# Patient Record
Sex: Female | Born: 1975 | Race: White | Hispanic: No | Marital: Married | Smoking: Former smoker
Health system: Southern US, Community
[De-identification: ages and names within clinical notes are randomized; demographics above are authoritative.]

## PROBLEM LIST (undated history)

## (undated) DIAGNOSIS — K219 Gastro-esophageal reflux disease without esophagitis: Secondary | ICD-10-CM

## (undated) DIAGNOSIS — R7303 Prediabetes: Secondary | ICD-10-CM

## (undated) DIAGNOSIS — T7840XA Allergy, unspecified, initial encounter: Secondary | ICD-10-CM

## (undated) DIAGNOSIS — L409 Psoriasis, unspecified: Secondary | ICD-10-CM

## (undated) DIAGNOSIS — M1990 Unspecified osteoarthritis, unspecified site: Secondary | ICD-10-CM

## (undated) DIAGNOSIS — Z87891 Personal history of nicotine dependence: Secondary | ICD-10-CM

## (undated) DIAGNOSIS — R112 Nausea with vomiting, unspecified: Secondary | ICD-10-CM

## (undated) DIAGNOSIS — F32A Depression, unspecified: Secondary | ICD-10-CM

## (undated) DIAGNOSIS — Z9889 Other specified postprocedural states: Secondary | ICD-10-CM

## (undated) DIAGNOSIS — F419 Anxiety disorder, unspecified: Secondary | ICD-10-CM

## (undated) DIAGNOSIS — Z87442 Personal history of urinary calculi: Secondary | ICD-10-CM

## (undated) DIAGNOSIS — E039 Hypothyroidism, unspecified: Secondary | ICD-10-CM

## (undated) DIAGNOSIS — R000 Tachycardia, unspecified: Secondary | ICD-10-CM

## (undated) DIAGNOSIS — F329 Major depressive disorder, single episode, unspecified: Secondary | ICD-10-CM

## (undated) DIAGNOSIS — T8859XA Other complications of anesthesia, initial encounter: Secondary | ICD-10-CM

## (undated) HISTORY — DX: Anxiety disorder, unspecified: F41.9

## (undated) HISTORY — DX: Allergy, unspecified, initial encounter: T78.40XA

## (undated) HISTORY — DX: Gastro-esophageal reflux disease without esophagitis: K21.9

## (undated) HISTORY — DX: Psoriasis, unspecified: L40.9

## (undated) HISTORY — PX: FOOT SURGERY: SHX648

## (undated) HISTORY — DX: Major depressive disorder, single episode, unspecified: F32.9

## (undated) HISTORY — DX: Depression, unspecified: F32.A

---
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
## 2000-12-27 ENCOUNTER — Other Ambulatory Visit: Admission: RE | Admit: 2000-12-27 | Discharge: 2000-12-27 | Payer: Self-pay | Admitting: Family Medicine

## 2002-08-10 DIAGNOSIS — Z87442 Personal history of urinary calculi: Secondary | ICD-10-CM

## 2002-08-10 HISTORY — PX: LITHOTRIPSY: SUR834

## 2002-08-10 HISTORY — DX: Personal history of urinary calculi: Z87.442

## 2002-08-10 HISTORY — PX: OTHER SURGICAL HISTORY: SHX169

## 2005-05-23 ENCOUNTER — Emergency Department: Payer: Self-pay | Admitting: General Practice

## 2007-10-19 ENCOUNTER — Emergency Department: Payer: Self-pay | Admitting: Emergency Medicine

## 2007-10-24 ENCOUNTER — Ambulatory Visit: Payer: Self-pay | Admitting: Family Medicine

## 2008-03-23 DIAGNOSIS — R002 Palpitations: Secondary | ICD-10-CM | POA: Insufficient documentation

## 2009-11-18 ENCOUNTER — Emergency Department: Payer: Self-pay | Admitting: Emergency Medicine

## 2009-11-21 ENCOUNTER — Ambulatory Visit: Payer: Self-pay

## 2010-08-10 HISTORY — PX: FOOT FRACTURE SURGERY: SHX645

## 2011-07-23 ENCOUNTER — Ambulatory Visit: Payer: Self-pay | Admitting: Rheumatology

## 2011-08-21 ENCOUNTER — Ambulatory Visit: Payer: Self-pay | Admitting: Podiatry

## 2011-09-24 ENCOUNTER — Encounter: Payer: Self-pay | Admitting: Podiatry

## 2011-10-09 ENCOUNTER — Encounter: Payer: Self-pay | Admitting: Podiatry

## 2011-11-09 ENCOUNTER — Encounter: Payer: Self-pay | Admitting: Podiatry

## 2012-05-17 LAB — HM PAP SMEAR: HM Pap smear: NEGATIVE

## 2013-06-29 ENCOUNTER — Ambulatory Visit: Payer: Self-pay | Admitting: Family Medicine

## 2013-08-27 ENCOUNTER — Ambulatory Visit: Payer: Self-pay | Admitting: Physician Assistant

## 2013-09-07 ENCOUNTER — Ambulatory Visit: Payer: Self-pay | Admitting: Family Medicine

## 2014-04-17 ENCOUNTER — Encounter: Payer: Self-pay | Admitting: Podiatry

## 2014-05-10 ENCOUNTER — Encounter: Payer: Self-pay | Admitting: Podiatry

## 2014-06-10 ENCOUNTER — Encounter: Payer: Self-pay | Admitting: Podiatry

## 2014-12-02 NOTE — Op Note (Signed)
PATIENT NAME:  Kelsey Randolph, Kelsey Randolph MR#:  [id number] DATE OF BIRTH:  October 11, 1975  DATE OF PROCEDURE:  08/21/2011  PREOPERATIVE DIAGNOSIS: Left ankle osteochondral defect.   POSTOPERATIVE DIAGNOSIS: Left ankle osteochondral defect.    PROCEDURE: Ankle arthroscopy with arthroscopic repair and debridement of osteochondral defect with debridement of anterior ankle synovitis.   SURGEON: Tiawanna Luchsinger A. Vickki Randolph, DPM.   ANESTHESIA: General.   HEMOSTASIS: Thigh tourniquet inflated to 325 mmHg for exactly 66 minutes.   COMPLICATIONS: None.   SPECIMEN: None.   OPERATIVE INDICATIONS: This is a 39 year old female with complaint of chronic left ankle pain. She has undergone a fair amount of conservative treatment and presents today for surgical intervention. All risks, benefits, alternatives, and complications associated with surgery were discussed with the patient. Full consent has been given.   OPERATIVE PROCEDURE: The patient was brought into the Operating Room and placed on the operating table in the supine position. General intubation was administered by the anesthesia team. The left lower extremity was then prepped and draped in the usual sterile fashion. Attention was directed to the left ankle where the anteromedial and anterolateral portals were made. Blunt dissection was taken down into the ankle joint. The anteromedial portal was entered initially. The arthroscope was then entered and there was noted to be a fair amount of synovitis on the anterior aspect of the ankle joint. At this time, through the anterolateral portal the debrider was brought in and the anterior aspect of the ankle joint synovitis was debrided away to good healthy tissue. At this time the ankle distractor was then placed and distraction was placed to allow visualization of basically the entire ankle joint. The lateral and central portion of the talus did not show any weakness of the cartilage on the medial shoulder. In the central to  posterior central aspect there was noted to be fraying of cartilage with an obvious osteochondral defect. At this time with a combination of curettage and shaving, I was able to remove the loose articular cartilage. Multiple microfractures were then made with the small joint microfracture set. It was also debrided with an arthroscopic debriding wand for cleaning of the edges to allow a nice smooth edge along the cartilage defect that had been debrided away. There was noted to be a little bit of posterior inflammatory tissue and this was also debrided with the Arthro wand as well as a shaver. This took a fair amount of posterior impingement out of the posterior lateral aspect of the ankle joint. At this time, no further defects were noted and layered closure was performed with a 4-0 Vicryl for the subcutaneous tissue and a 4-0 nylon for the skin. A 0.5% Marcaine block with epinephrine was placed into the ankle joint portals. She was then placed in a well compressive sterile dressing into an equalizer walker boot. She is instructed for nonweightbearing to her left ankle. She was given a prescription for Percocet for pain and Phenergan for nausea and vomiting. I will see her in the outpatient clinic next week for further evaluation.   ____________________________ Kelsey Randolph, DPM jaf:ap D: 08/21/2011 11:58:46 ET T: 08/21/2011 15:22:35 ET JOB#: [id number]  cc: Larkin Ina A. Vickki Randolph, DPM, <Dictator> Kelsey Randolph DPM ELECTRONICALLY SIGNED 09/10/2011 15:14

## 2015-01-28 ENCOUNTER — Encounter: Payer: Self-pay | Admitting: Family Medicine

## 2015-01-28 DIAGNOSIS — J309 Allergic rhinitis, unspecified: Secondary | ICD-10-CM | POA: Insufficient documentation

## 2015-01-28 DIAGNOSIS — K219 Gastro-esophageal reflux disease without esophagitis: Secondary | ICD-10-CM | POA: Insufficient documentation

## 2015-01-28 DIAGNOSIS — L409 Psoriasis, unspecified: Secondary | ICD-10-CM | POA: Insufficient documentation

## 2015-01-28 DIAGNOSIS — M722 Plantar fascial fibromatosis: Secondary | ICD-10-CM | POA: Insufficient documentation

## 2015-01-28 DIAGNOSIS — R7303 Prediabetes: Secondary | ICD-10-CM | POA: Insufficient documentation

## 2015-01-28 DIAGNOSIS — M545 Low back pain, unspecified: Secondary | ICD-10-CM | POA: Insufficient documentation

## 2015-01-28 DIAGNOSIS — R079 Chest pain, unspecified: Secondary | ICD-10-CM | POA: Insufficient documentation

## 2015-01-28 DIAGNOSIS — Z3009 Encounter for other general counseling and advice on contraception: Secondary | ICD-10-CM | POA: Insufficient documentation

## 2015-01-28 DIAGNOSIS — F419 Anxiety disorder, unspecified: Secondary | ICD-10-CM | POA: Insufficient documentation

## 2015-01-28 DIAGNOSIS — R799 Abnormal finding of blood chemistry, unspecified: Secondary | ICD-10-CM | POA: Insufficient documentation

## 2015-01-28 DIAGNOSIS — E559 Vitamin D deficiency, unspecified: Secondary | ICD-10-CM | POA: Insufficient documentation

## 2015-03-13 ENCOUNTER — Encounter: Payer: Self-pay | Admitting: Family Medicine

## 2015-05-14 ENCOUNTER — Encounter: Payer: Self-pay | Admitting: Family Medicine

## 2015-08-13 ENCOUNTER — Encounter: Payer: Self-pay | Admitting: Family Medicine

## 2015-09-12 DIAGNOSIS — M659 Synovitis and tenosynovitis, unspecified: Secondary | ICD-10-CM | POA: Diagnosis not present

## 2015-09-12 DIAGNOSIS — M2011 Hallux valgus (acquired), right foot: Secondary | ICD-10-CM | POA: Diagnosis not present

## 2015-09-30 DIAGNOSIS — M9903 Segmental and somatic dysfunction of lumbar region: Secondary | ICD-10-CM | POA: Diagnosis not present

## 2015-09-30 DIAGNOSIS — M5441 Lumbago with sciatica, right side: Secondary | ICD-10-CM | POA: Diagnosis not present

## 2015-09-30 DIAGNOSIS — M7741 Metatarsalgia, right foot: Secondary | ICD-10-CM | POA: Diagnosis not present

## 2015-09-30 DIAGNOSIS — M62830 Muscle spasm of back: Secondary | ICD-10-CM | POA: Diagnosis not present

## 2015-10-01 DIAGNOSIS — M9903 Segmental and somatic dysfunction of lumbar region: Secondary | ICD-10-CM | POA: Diagnosis not present

## 2015-10-01 DIAGNOSIS — M9906 Segmental and somatic dysfunction of lower extremity: Secondary | ICD-10-CM | POA: Diagnosis not present

## 2015-10-01 DIAGNOSIS — M7741 Metatarsalgia, right foot: Secondary | ICD-10-CM | POA: Diagnosis not present

## 2015-10-01 DIAGNOSIS — M62830 Muscle spasm of back: Secondary | ICD-10-CM | POA: Diagnosis not present

## 2015-10-01 DIAGNOSIS — M5441 Lumbago with sciatica, right side: Secondary | ICD-10-CM | POA: Diagnosis not present

## 2015-10-03 DIAGNOSIS — M62830 Muscle spasm of back: Secondary | ICD-10-CM | POA: Diagnosis not present

## 2015-10-03 DIAGNOSIS — M7741 Metatarsalgia, right foot: Secondary | ICD-10-CM | POA: Diagnosis not present

## 2015-10-03 DIAGNOSIS — M9903 Segmental and somatic dysfunction of lumbar region: Secondary | ICD-10-CM | POA: Diagnosis not present

## 2015-10-03 DIAGNOSIS — M5441 Lumbago with sciatica, right side: Secondary | ICD-10-CM | POA: Diagnosis not present

## 2015-10-03 DIAGNOSIS — M9906 Segmental and somatic dysfunction of lower extremity: Secondary | ICD-10-CM | POA: Diagnosis not present

## 2015-10-07 DIAGNOSIS — M62830 Muscle spasm of back: Secondary | ICD-10-CM | POA: Diagnosis not present

## 2015-10-07 DIAGNOSIS — M9903 Segmental and somatic dysfunction of lumbar region: Secondary | ICD-10-CM | POA: Diagnosis not present

## 2015-10-07 DIAGNOSIS — M5441 Lumbago with sciatica, right side: Secondary | ICD-10-CM | POA: Diagnosis not present

## 2015-10-07 DIAGNOSIS — M7741 Metatarsalgia, right foot: Secondary | ICD-10-CM | POA: Diagnosis not present

## 2015-10-07 DIAGNOSIS — M9906 Segmental and somatic dysfunction of lower extremity: Secondary | ICD-10-CM | POA: Diagnosis not present

## 2015-10-10 DIAGNOSIS — M5441 Lumbago with sciatica, right side: Secondary | ICD-10-CM | POA: Diagnosis not present

## 2015-10-10 DIAGNOSIS — M7741 Metatarsalgia, right foot: Secondary | ICD-10-CM | POA: Diagnosis not present

## 2015-10-10 DIAGNOSIS — M9903 Segmental and somatic dysfunction of lumbar region: Secondary | ICD-10-CM | POA: Diagnosis not present

## 2015-10-10 DIAGNOSIS — M62830 Muscle spasm of back: Secondary | ICD-10-CM | POA: Diagnosis not present

## 2015-10-14 ENCOUNTER — Encounter: Payer: Self-pay | Admitting: Family Medicine

## 2015-10-16 DIAGNOSIS — M62830 Muscle spasm of back: Secondary | ICD-10-CM | POA: Diagnosis not present

## 2015-10-16 DIAGNOSIS — M9903 Segmental and somatic dysfunction of lumbar region: Secondary | ICD-10-CM | POA: Diagnosis not present

## 2015-10-16 DIAGNOSIS — M5441 Lumbago with sciatica, right side: Secondary | ICD-10-CM | POA: Diagnosis not present

## 2015-10-16 DIAGNOSIS — M7741 Metatarsalgia, right foot: Secondary | ICD-10-CM | POA: Diagnosis not present

## 2015-11-14 DIAGNOSIS — M25561 Pain in right knee: Secondary | ICD-10-CM | POA: Diagnosis not present

## 2015-11-14 DIAGNOSIS — L4050 Arthropathic psoriasis, unspecified: Secondary | ICD-10-CM | POA: Diagnosis not present

## 2015-11-28 ENCOUNTER — Encounter: Payer: Self-pay | Admitting: Family Medicine

## 2015-12-05 DIAGNOSIS — H5203 Hypermetropia, bilateral: Secondary | ICD-10-CM | POA: Diagnosis not present

## 2016-01-07 ENCOUNTER — Other Ambulatory Visit: Payer: Self-pay | Admitting: Family Medicine

## 2016-01-07 DIAGNOSIS — E039 Hypothyroidism, unspecified: Secondary | ICD-10-CM

## 2016-01-22 ENCOUNTER — Ambulatory Visit (INDEPENDENT_AMBULATORY_CARE_PROVIDER_SITE_OTHER): Payer: 59 | Admitting: Family Medicine

## 2016-01-22 ENCOUNTER — Encounter: Payer: Self-pay | Admitting: Family Medicine

## 2016-01-22 VITALS — BP 110/80 | HR 96 | Temp 98.7°F | Resp 16 | Ht 63.0 in | Wt 322.0 lb

## 2016-01-22 DIAGNOSIS — F32A Depression, unspecified: Secondary | ICD-10-CM

## 2016-01-22 DIAGNOSIS — Z0000 Encounter for general adult medical examination without abnormal findings: Secondary | ICD-10-CM | POA: Diagnosis not present

## 2016-01-22 DIAGNOSIS — R7303 Prediabetes: Secondary | ICD-10-CM

## 2016-01-22 DIAGNOSIS — Z124 Encounter for screening for malignant neoplasm of cervix: Secondary | ICD-10-CM

## 2016-01-22 DIAGNOSIS — Z87891 Personal history of nicotine dependence: Secondary | ICD-10-CM | POA: Diagnosis not present

## 2016-01-22 DIAGNOSIS — F419 Anxiety disorder, unspecified: Secondary | ICD-10-CM

## 2016-01-22 DIAGNOSIS — F329 Major depressive disorder, single episode, unspecified: Secondary | ICD-10-CM

## 2016-01-22 DIAGNOSIS — E039 Hypothyroidism, unspecified: Secondary | ICD-10-CM

## 2016-01-22 MED ORDER — ALPRAZOLAM 0.5 MG PO TABS: 0.5000 mg | ORAL_TABLET | Freq: Every evening | ORAL | Status: DC | PRN

## 2016-01-22 MED ORDER — LEVOTHYROXINE SODIUM 50 MCG PO TABS: 50.0000 ug | ORAL_TABLET | Freq: Every day | ORAL | Status: DC

## 2016-01-22 MED ORDER — ESCITALOPRAM OXALATE 20 MG PO TABS: 20.0000 mg | ORAL_TABLET | Freq: Every day | ORAL | Status: DC

## 2016-01-22 NOTE — Progress Notes (Signed)
Patient ID: Kelsey Randolph, female   DOB: 1976/04/28, 40 y.o.   MRN: [id number]       Patient: Kelsey Randolph, Female    DOB: Sep 09, 1975, 40 y.o.   MRN: [id number] Visit Date: 01/22/2016  Today's Provider: Margarita Rana, MD   Chief Complaint  Patient presents with  . Annual Exam   Subjective:    Annual physical exam Kelsey Randolph is a 40 y.o. female who presents today for health maintenance and complete physical. She feels well. She reports exercising 4 days a week. She reports she is sleeping well.  06/29/13 CPE 05/17/12 Pap-neg; HPV-neg  -----------------------------------------------------------------   Review of Systems  Constitutional: Negative.   HENT: Negative.   Eyes: Positive for discharge.  Respiratory: Negative.   Cardiovascular: Negative.   Gastrointestinal: Negative.   Endocrine: Negative.   Genitourinary: Negative.   Musculoskeletal: Positive for joint swelling and arthralgias.  Skin: Negative.   Allergic/Immunologic: Positive for environmental allergies.  Neurological: Negative.   Hematological: Bruises/bleeds easily.  Psychiatric/Behavioral: Negative.     Social History      She  reports that she quit smoking about 5 months ago. She has never used smokeless tobacco. She reports that she does not drink alcohol or use illicit drugs.       Social History   Social History  . Marital Status: Married    Spouse Name: N/A  . Number of Children: N/A  . Years of Education: N/A   Social History Main Topics  . Smoking status: Former Smoker    Quit date: 08/10/2015  . Smokeless tobacco: Never Used  . Alcohol Use: No  . Drug Use: No  . Sexual Activity: Not Asked   Other Topics Concern  . None   Social History Narrative    Past Medical History  Diagnosis Date  . Allergy   . Anxiety   . Depression   . GERD (gastroesophageal reflux disease)      Patient Active Problem List   Diagnosis Date Noted  . Abnormal blood chemistry 01/28/2015  .  Allergic rhinitis 01/28/2015  . Anxiety 01/28/2015  . Chest pain 01/28/2015  . Family planning 01/28/2015  . Acid reflux 01/28/2015  . LBP (low back pain) 01/28/2015  . Plantar fasciitis 01/28/2015  . Borderline diabetes 01/28/2015  . Psoriasis 01/28/2015  . Avitaminosis D 01/28/2015  . Disease of white blood cells 11/22/2009  . Awareness of heartbeats 03/23/2008  . Adult hypothyroidism 03/14/2008  . History of tobacco use 11/30/2007  . Calculus of kidney 11/23/2007  . 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 Clinical depression 03/04/2001  . Extreme obesity ([place]) 08/10/1998    Past Surgical History  Procedure Laterality Date  . Kidney stones removed  2004  . Foot surgery Left   . Tonsillectomy and adenoidectomy  1993    Family History        Family Status  Relation Status Death Age  . Mother Alive   . Brother Alive   . Paternal Grandfather Deceased 22  . Father Deceased 63    MI        Her family history includes Dementia in her maternal grandmother; Depression in her mother; Diabetes in her maternal grandmother; Healthy in her brother; Heart disease in her father and paternal grandfather; Hyperlipidemia in her mother; Hypertension in her mother.    No Known Allergies  Current Meds  Medication Sig  . ALPRAZolam (XANAX) 0.5 MG tablet Take by mouth.  . cetirizine (ZYRTEC ALLERGY) 10 MG  tablet Take by mouth.  . Cholecalciferol 1000 UNITS capsule Take by mouth.  . escitalopram (LEXAPRO) 20 MG tablet Take 20 mg by mouth daily.   . hydrocortisone valerate ointment (WESTCORT) 0.2 % WESTCORT, 0.2% (External Ointment)  1 Ointment apply daily to affected area for 0 days  Quantity: 45;  Refills: 1   Ordered :19-Mar-2011  Ashley Royalty ;  Started 19-Mar-2011 Active  . levothyroxine (SYNTHROID, LEVOTHROID) 50 MCG tablet TAKE 1 TABLET BY MOUTH ONCE DAILY  . Nutritional Supplements (ANTI-INFLAMMATORY ENZYME PO) Take 2 capsules by mouth daily.  Marland Kitchen sulfaSALAzine (AZULFIDINE)  500 MG tablet Take 500 mg by mouth 2 (two) times daily.    Patient Care Team: Margarita Rana, MD as PCP - General (Family Medicine)     Objective:   Vitals: BP 110/80 mmHg  Pulse 96  Temp(Src) 98.7 F (37.1 C) (Oral)  Resp 16  Ht 5\' 3"  (1.6 m)  Wt 322 lb (146.058 kg)  BMI 57.05 kg/m2   Physical Exam  Constitutional: She is oriented to person, place, and time. She appears well-developed and well-nourished.  HENT:  Head: Normocephalic and atraumatic.  Right Ear: Tympanic membrane, external ear and ear canal normal.  Left Ear: Tympanic membrane, external ear and ear canal normal.  Nose: Mucosal edema present.  Mouth/Throat: Uvula is midline, oropharynx is clear and moist and mucous membranes are normal.  Eyes: Conjunctivae, EOM and lids are normal. Pupils are equal, round, and reactive to light.  Neck: Trachea normal and normal range of motion. Neck supple. Carotid bruit is not present. No thyroid mass and no thyromegaly present.  Cardiovascular: Normal rate, regular rhythm and normal heart sounds.   Pulmonary/Chest: Effort normal and breath sounds normal.  Abdominal: Soft. Normal appearance and bowel sounds are normal. There is no hepatosplenomegaly. There is no tenderness.  Genitourinary: Vagina normal. No breast swelling, tenderness or discharge.  Musculoskeletal: Normal range of motion.  Lymphadenopathy:    She has no cervical adenopathy.    She has no axillary adenopathy.  Neurological: She is alert and oriented to person, place, and time. She has normal strength. No cranial nerve deficit.  Skin: Skin is warm, dry and intact.  Psychiatric: She has a normal mood and affect. Her speech is normal and behavior is normal. Judgment and thought content normal. Cognition and memory are normal.    Depression Screen PHQ 2/9 Scores 01/22/2016  PHQ - 2 Score 1    Assessment & Plan:     Routine Health Maintenance and Physical Exam  Exercise Activities and Dietary  recommendations Goals    None      Immunization History  Administered Date(s) Administered  . Tdap 10/21/2006     -------------------------------------------------------------------- 1. Annual physical exam Stable. Patient advised to continue eating healthy and exercise daily.  2. Cervical cancer screening - Pap IG and HPV (high risk) DNA detection  3. Hypothyroidism, unspecified hypothyroidism type - CBC with Differential/Platelet - TSH - levothyroxine (SYNTHROID, LEVOTHROID) 50 MCG tablet; Take 1 tablet (50 mcg total) by mouth daily.  Dispense: 90 tablet; Refill: 3  4. Borderline diabetes - Comprehensive metabolic panel - Hemoglobin A1c  5. Morbid obesity, unspecified obesity type ([place]) Will check labs. Stressed importance of  Weight loss.  Recommend consider weight loss surgery.   - Lipid Panel With LDL/HDL Ratio  6. Anxiety - ALPRAZolam (XANAX) 0.5 MG tablet; Take 1 tablet (0.5 mg total) by mouth at bedtime as needed for anxiety.  Dispense: 90 tablet; Refill: 3  7. Clinical  depression - escitalopram (LEXAPRO) 20 MG tablet; Take 1 tablet (20 mg total) by mouth daily.  Dispense: 90 tablet; Refill: 3   Patient seen and examined by Dr. Jerrell Belfast, and note scribed by Philbert Riser. Dimas, CMA.  I have reviewed the document for accuracy and completeness and I agree with above. - Jerrell Belfast, MD   Margarita Rana, MD  [facility]

## 2016-01-24 ENCOUNTER — Telehealth: Payer: Self-pay

## 2016-01-24 LAB — PAP IG AND HPV HIGH-RISK
HPV, high-risk: NEGATIVE
PAP Smear Comment: 0

## 2016-01-24 NOTE — Telephone Encounter (Signed)
Patient advised as below.  

## 2016-01-24 NOTE — Telephone Encounter (Signed)
-----   Message from Margarita Rana, MD sent at 01/24/2016  1:46 PM EDT ----- Pap is normal. Please notify patient.

## 2016-01-30 ENCOUNTER — Other Ambulatory Visit: Payer: Self-pay | Admitting: Family Medicine

## 2016-01-30 DIAGNOSIS — R7303 Prediabetes: Secondary | ICD-10-CM | POA: Diagnosis not present

## 2016-01-30 DIAGNOSIS — E039 Hypothyroidism, unspecified: Secondary | ICD-10-CM | POA: Diagnosis not present

## 2016-01-31 LAB — LIPID PANEL WITH LDL/HDL RATIO
Cholesterol, Total: 131 mg/dL (ref 100–199)
HDL: 47 mg/dL (ref >39)
LDL CALC: 72 mg/dL (ref 0–99)
LDL/HDL RATIO: 1.5 ratio (ref 0.0–3.2)
Triglycerides: 61 mg/dL (ref 0–149)
VLDL CHOLESTEROL CAL: 12 mg/dL (ref 5–40)

## 2016-01-31 LAB — COMPREHENSIVE METABOLIC PANEL
A/G RATIO: 1.3 (ref 1.2–2.2)
ALK PHOS: 79 IU/L (ref 39–117)
ALT: 13 IU/L (ref 0–32)
AST: 19 IU/L (ref 0–40)
Albumin: 3.9 g/dL (ref 3.5–5.5)
BUN/Creatinine Ratio: 28 — ABNORMAL HIGH (ref 9–23)
BUN: 16 mg/dL (ref 6–20)
Bilirubin Total: 0.3 mg/dL (ref 0.0–1.2)
CALCIUM: 9.1 mg/dL (ref 8.7–10.2)
CHLORIDE: 99 mmol/L (ref 96–106)
CO2: 22 mmol/L (ref 18–29)
Creatinine, Ser: 0.57 mg/dL (ref 0.57–1.00)
GFR calc Af Amer: 136 mL/min/{1.73_m2} (ref >59)
GFR, EST NON AFRICAN AMERICAN: 118 mL/min/{1.73_m2} (ref >59)
GLOBULIN, TOTAL: 2.9 g/dL (ref 1.5–4.5)
Glucose: 98 mg/dL (ref 65–99)
POTASSIUM: 3.7 mmol/L (ref 3.5–5.2)
SODIUM: 145 mmol/L — AB (ref 134–144)
Total Protein: 6.8 g/dL (ref 6.0–8.5)

## 2016-01-31 LAB — CBC WITH DIFFERENTIAL/PLATELET
BASOS: 0 %
Basophils Absolute: 0 10*3/uL (ref 0.0–0.2)
EOS (ABSOLUTE): 0.2 10*3/uL (ref 0.0–0.4)
EOS: 3 %
HEMATOCRIT: 39.9 % (ref 34.0–46.6)
Hemoglobin: 13.3 g/dL (ref 11.1–15.9)
Immature Grans (Abs): 0 10*3/uL (ref 0.0–0.1)
Immature Granulocytes: 0 %
LYMPHS ABS: 1.9 10*3/uL (ref 0.7–3.1)
Lymphs: 23 %
MCH: 28.4 pg (ref 26.6–33.0)
MCHC: 33.3 g/dL (ref 31.5–35.7)
MCV: 85 fL (ref 79–97)
MONOS ABS: 0.4 10*3/uL (ref 0.1–0.9)
Monocytes: 4 %
Neutrophils Absolute: 5.9 10*3/uL (ref 1.4–7.0)
Neutrophils: 70 %
Platelets: 310 10*3/uL (ref 150–379)
RBC: 4.69 x10E6/uL (ref 3.77–5.28)
RDW: 14 % (ref 12.3–15.4)
WBC: 8.4 10*3/uL (ref 3.4–10.8)

## 2016-01-31 LAB — TSH: TSH: 2.01 u[IU]/mL (ref 0.450–4.500)

## 2016-01-31 LAB — HEMOGLOBIN A1C
Est. average glucose Bld gHb Est-mCnc: 114 mg/dL
Hgb A1c MFr Bld: 5.6 % (ref 4.8–5.6)

## 2016-02-03 ENCOUNTER — Telehealth: Payer: Self-pay

## 2016-02-03 NOTE — Telephone Encounter (Signed)
-----   Message from Margarita Rana, MD sent at 01/31/2016  5:03 PM EDT ----- Labs stable. Please notify patient. Thanks.

## 2016-02-03 NOTE — Telephone Encounter (Signed)
LMTCB Emily Drozdowski, CMA  

## 2016-02-03 NOTE — Telephone Encounter (Signed)
Advised pt of lab results. Pt verbally acknowledges understanding. Emily Drozdowski, CMA   

## 2016-02-12 ENCOUNTER — Ambulatory Visit: Payer: Self-pay | Admitting: Physician Assistant

## 2016-02-12 ENCOUNTER — Encounter: Payer: Self-pay | Admitting: Physician Assistant

## 2016-02-12 VITALS — BP 140/94 | HR 78 | Temp 98.1°F

## 2016-02-12 DIAGNOSIS — M549 Dorsalgia, unspecified: Secondary | ICD-10-CM

## 2016-02-12 MED ORDER — MELOXICAM 15 MG PO TABS: 15.0000 mg | ORAL_TABLET | Freq: Every day | ORAL | Status: DC

## 2016-02-12 MED ORDER — CYCLOBENZAPRINE HCL 10 MG PO TABS: 10.0000 mg | ORAL_TABLET | Freq: Three times a day (TID) | ORAL | Status: DC | PRN

## 2016-02-12 NOTE — Progress Notes (Signed)
S:  C/o low back pain for 1 day, awoke with pain this morning,  no known injury but did do a lot of cleaning 3 days ago, pain is worse with movement, increased with bending over, denies numbness, tingling, or changes in bowel/urinary habits,  Using otc meds without relief Remainder ros neg  O:  Vitals wnl, nad, lungs c t a, cv rrr, spine nontender, muscles in lower back spasmed , decreased rom with,  Neg slr, pt walks without difficulty, no foot drop noted, n/v intact  A: acute back pain, muscle spasms  P: use wet heat followed by ice, stretches, return to clinic if not better in 3 t 5 days, return earlier if worsening, rx meds: mobic, flexeril

## 2016-03-01 ENCOUNTER — Encounter: Payer: Self-pay | Admitting: Family Medicine

## 2016-05-01 ENCOUNTER — Encounter: Payer: Self-pay | Admitting: Physician Assistant

## 2016-05-01 ENCOUNTER — Ambulatory Visit: Payer: Self-pay | Admitting: Physician Assistant

## 2016-05-01 VITALS — BP 130/92 | HR 80 | Temp 98.5°F

## 2016-05-01 DIAGNOSIS — H6981 Other specified disorders of Eustachian tube, right ear: Secondary | ICD-10-CM

## 2016-05-01 DIAGNOSIS — H6991 Unspecified Eustachian tube disorder, right ear: Secondary | ICD-10-CM

## 2016-05-01 MED ORDER — PREDNISONE 10 MG PO TABS: 30.0000 mg | ORAL_TABLET | Freq: Every day | ORAL | 0 refills | Status: DC

## 2016-05-01 MED ORDER — FLUTICASONE PROPIONATE 50 MCG/ACT NA SUSP: 2.0000 | Freq: Every day | NASAL | 6 refills | Status: DC

## 2016-05-01 NOTE — Progress Notes (Signed)
S:  C/o r ear popping and being stopped up, painful,  no drainage from ears, no fever/chills, no cough or congestion, some sinus pressure, remainder ros neg Using otc meds without relief  O:  Vitals wnl, nad, tms dull b/l, nasal mucosa swollen, throat wnl, neck supple no lymph, lungs c t a, cv rrr, neuro intact  A: acute eustachean tube dysfunction  P: flonase, sudafed, prednisone 30mg  qd x 3d, return if not improving in 3 to 5 days, return earlier if worsening

## 2016-06-05 DIAGNOSIS — M9903 Segmental and somatic dysfunction of lumbar region: Secondary | ICD-10-CM | POA: Diagnosis not present

## 2016-06-05 DIAGNOSIS — M4607 Spinal enthesopathy, lumbosacral region: Secondary | ICD-10-CM | POA: Diagnosis not present

## 2016-06-05 DIAGNOSIS — M62830 Muscle spasm of back: Secondary | ICD-10-CM | POA: Diagnosis not present

## 2016-06-05 DIAGNOSIS — M4608 Spinal enthesopathy, sacral and sacrococcygeal region: Secondary | ICD-10-CM | POA: Diagnosis not present

## 2016-06-11 DIAGNOSIS — L4050 Arthropathic psoriasis, unspecified: Secondary | ICD-10-CM | POA: Diagnosis not present

## 2016-06-11 DIAGNOSIS — M25511 Pain in right shoulder: Secondary | ICD-10-CM | POA: Diagnosis not present

## 2016-06-11 DIAGNOSIS — M778 Other enthesopathies, not elsewhere classified: Secondary | ICD-10-CM | POA: Diagnosis not present

## 2016-06-27 ENCOUNTER — Ambulatory Visit (INDEPENDENT_AMBULATORY_CARE_PROVIDER_SITE_OTHER): Payer: 59 | Admitting: Physician Assistant

## 2016-06-27 ENCOUNTER — Encounter: Payer: Self-pay | Admitting: Physician Assistant

## 2016-06-27 VITALS — BP 124/84 | HR 92 | Temp 98.6°F | Resp 16 | Wt 315.0 lb

## 2016-06-27 DIAGNOSIS — J069 Acute upper respiratory infection, unspecified: Secondary | ICD-10-CM | POA: Diagnosis not present

## 2016-06-27 DIAGNOSIS — J40 Bronchitis, not specified as acute or chronic: Secondary | ICD-10-CM | POA: Diagnosis not present

## 2016-06-27 DIAGNOSIS — T3695XA Adverse effect of unspecified systemic antibiotic, initial encounter: Secondary | ICD-10-CM | POA: Diagnosis not present

## 2016-06-27 DIAGNOSIS — B379 Candidiasis, unspecified: Secondary | ICD-10-CM

## 2016-06-27 MED ORDER — PREDNISONE 10 MG (21) PO TBPK: ORAL_TABLET | ORAL | 0 refills | Status: DC

## 2016-06-27 MED ORDER — FLUCONAZOLE 150 MG PO TABS: 150.0000 mg | ORAL_TABLET | Freq: Once | ORAL | 0 refills | Status: AC

## 2016-06-27 MED ORDER — AMOXICILLIN-POT CLAVULANATE 875-125 MG PO TABS: 1.0000 | ORAL_TABLET | Freq: Two times a day (BID) | ORAL | 0 refills | Status: DC

## 2016-06-27 NOTE — Progress Notes (Signed)
Patient: Kelsey Randolph Female    DOB: [birth year]   40 y.o.   MRN: [id number] Visit Date: 06/27/2016  Today's Provider: Mar Daring, PA-C   Chief Complaint  Patient presents with  . Sore Throat   Subjective:    Sore Throat   This is a new problem. The current episode started in the past 7 days (x 2 days). The problem has been gradually worsening. Neither side of throat is experiencing more pain than the other. There has been no fever. The pain is at a severity of 7/10. The pain is severe. Associated symptoms include congestion, coughing (productive), ear pain (bilateral), headaches (sinus), a hoarse voice, a plugged ear sensation and trouble swallowing. Pertinent negatives include no abdominal pain, ear discharge, neck pain, shortness of breath, swollen glands or vomiting. She has had no exposure to strep or mono. Treatments tried: Sudafed, Mucinex.       No Known Allergies   Current Outpatient Prescriptions:  .  ALPRAZolam (XANAX) 0.5 MG tablet, Take 1 tablet (0.5 mg total) by mouth at bedtime as needed for anxiety., Disp: 90 tablet, Rfl: 3 .  cetirizine (ZYRTEC ALLERGY) 10 MG tablet, Take by mouth., Disp: , Rfl:  .  Cholecalciferol 1000 UNITS capsule, Take by mouth., Disp: , Rfl:  .  cyclobenzaprine (FLEXERIL) 10 MG tablet, Take 1 tablet (10 mg total) by mouth 3 (three) times daily as needed for muscle spasms., Disp: 30 tablet, Rfl: 0 .  escitalopram (LEXAPRO) 20 MG tablet, Take 1 tablet (20 mg total) by mouth daily., Disp: 90 tablet, Rfl: 3 .  fluticasone (FLONASE) 50 MCG/ACT nasal spray, Place 2 sprays into both nostrils daily., Disp: 16 g, Rfl: 6 .  hydrocortisone valerate ointment (WESTCORT) 0.2 %, WESTCORT, 0.2% (External Ointment)  1 Ointment apply daily to affected area for 0 days  Quantity: 45;  Refills: 1   Ordered :19-Mar-2011  Ashley Royalty ;  Started 19-Mar-2011 Active, Disp: , Rfl:  .  levothyroxine (SYNTHROID, LEVOTHROID) 50 MCG tablet, Take 1 tablet (50  mcg total) by mouth daily., Disp: 90 tablet, Rfl: 3 .  meloxicam (MOBIC) 15 MG tablet, Take 1 tablet (15 mg total) by mouth daily., Disp: 30 tablet, Rfl: 0 .  Nutritional Supplements (ANTI-INFLAMMATORY ENZYME PO), Take 2 capsules by mouth daily., Disp: , Rfl:  .  sulfaSALAzine (AZULFIDINE) 500 MG tablet, Take 500 mg by mouth 2 (two) times daily., Disp: , Rfl:   Review of Systems  Constitutional: Positive for chills and fatigue. Negative for fever.  HENT: Positive for congestion, ear pain (bilateral), hoarse voice, postnasal drip, rhinorrhea, sinus pain, sinus pressure, sore throat and trouble swallowing. Negative for ear discharge and sneezing.   Eyes: Negative for visual disturbance.  Respiratory: Positive for cough (productive). Negative for chest tightness, shortness of breath and wheezing.   Cardiovascular: Negative for chest pain, palpitations and leg swelling.  Gastrointestinal: Negative for abdominal pain and vomiting.  Musculoskeletal: Negative for neck pain.  Neurological: Positive for headaches (sinus). Negative for dizziness.    Social History  Substance Use Topics  . Smoking status: Former Smoker    Quit date: 08/10/2015  . Smokeless tobacco: Never Used  . Alcohol use No   Objective:   BP 124/84 (BP Location: Right Arm, Patient Position: Sitting, Cuff Size: Large)   Pulse 92   Temp 98.6 F (37 C) (Oral)   Resp 16   Wt (!) 315 lb (142.9 kg)   LMP 06/01/2016   BMI  55.80 kg/m   Physical Exam  Constitutional: She appears well-developed and well-nourished. No distress.  HENT:  Head: Normocephalic and atraumatic.  Right Ear: Hearing, external ear and ear canal normal. Tympanic membrane is not erythematous and not bulging. A middle ear effusion is present.  Left Ear: Hearing, external ear and ear canal normal. Tympanic membrane is not erythematous and not bulging. A middle ear effusion is present.  Nose: Mucosal edema and rhinorrhea present. Right sinus exhibits no  maxillary sinus tenderness and no frontal sinus tenderness. Left sinus exhibits no maxillary sinus tenderness and no frontal sinus tenderness.  Mouth/Throat: Uvula is midline and mucous membranes are normal. Posterior oropharyngeal erythema present. No oropharyngeal exudate or posterior oropharyngeal edema.  Eyes: Conjunctivae are normal. Pupils are equal, round, and reactive to light. Right eye exhibits no discharge. Left eye exhibits no discharge. No scleral icterus.  Neck: Normal range of motion. Neck supple. No tracheal deviation present. No thyromegaly present.  Cardiovascular: Normal rate, regular rhythm and normal heart sounds.  Exam reveals no gallop and no friction rub.   No murmur heard. Pulmonary/Chest: Effort normal. No stridor. No respiratory distress. She has no decreased breath sounds. She has no wheezes. She has rhonchi (throughout). She has no rales.  Lymphadenopathy:    She has no cervical adenopathy.  Skin: Skin is warm and dry. She is not diaphoretic.  Vitals reviewed.      Assessment & Plan:     1. Upper respiratory tract infection, unspecified type Worsening symptoms not responding to OTC treatments. Will give augmentin as below. Continue mucinex for congestion. Push fluids and get plenty of rest. Call if no improvement.  - amoxicillin-clavulanate (AUGMENTIN) 875-125 MG tablet; Take 1 tablet by mouth 2 (two) times daily.  Dispense: 20 tablet; Refill: 0 - predniSONE (STERAPRED UNI-PAK 21 TAB) 10 MG (21) TBPK tablet; Take as directed on package directions  Dispense: 21 tablet; Refill: 0  2. Bronchitis Rhonchi noted throughout on exam. Prednisone taper given as below. She is to call if cough worsens or fails to improve.  - amoxicillin-clavulanate (AUGMENTIN) 875-125 MG tablet; Take 1 tablet by mouth 2 (two) times daily.  Dispense: 20 tablet; Refill: 0 - predniSONE (STERAPRED UNI-PAK 21 TAB) 10 MG (21) TBPK tablet; Take as directed on package directions  Dispense: 21 tablet;  Refill: 0  3. Antibiotic-induced yeast infection Previously had yeast infection on augmentin. Will give diflucan as below for her to use if she develops yeast infection while on antibiotic. - fluconazole (DIFLUCAN) 150 MG tablet; Take 1 tablet (150 mg total) by mouth once. May take 2nd tab PO 48-72 hrs after 1st if needed.  Dispense: 2 tablet; Refill: 0       Mar Daring, PA-C  [facility]

## 2016-06-27 NOTE — Patient Instructions (Signed)
Acute Bronchitis, Adult Acute bronchitis is sudden (acute) swelling of the air tubes (bronchi) in the lungs. Acute bronchitis causes these tubes to fill with mucus, which can make it hard to breathe. It can also cause coughing or wheezing. In adults, acute bronchitis usually goes away within 2 weeks. A cough caused by bronchitis may last up to 3 weeks. Smoking, allergies, and asthma can make the condition worse. Repeated episodes of bronchitis may cause further lung problems, such as chronic obstructive pulmonary disease (COPD). What are the causes? This condition can be caused by germs and by substances that irritate the lungs, including:  Cold and flu viruses. This condition is most often caused by the same virus that causes a cold.  Bacteria.  Exposure to tobacco smoke, dust, fumes, and air pollution. What increases the risk? This condition is more likely to develop in people who:  Have close contact with someone with acute bronchitis.  Are exposed to lung irritants, such as tobacco smoke, dust, fumes, and vapors.  Have a weak immune system.  Have a respiratory condition such as asthma. What are the signs or symptoms? Symptoms of this condition include:  A cough.  Coughing up clear, yellow, or green mucus.  Wheezing.  Chest congestion.  Shortness of breath.  A fever.  Body aches.  Chills.  A sore throat. How is this diagnosed? This condition is usually diagnosed with a physical exam. During the exam, your health care provider may order tests, such as chest X-rays, to rule out other conditions. He or she may also:  Test a sample of your mucus for bacterial infection.  Check the level of oxygen in your blood. This is done to check for pneumonia.  Do a chest X-ray or lung function testing to rule out pneumonia and other conditions.  Perform blood tests. Your health care provider will also ask about your symptoms and medical history. How is this treated? Most cases  of acute bronchitis clear up over time without treatment. Your health care provider may recommend:  Drinking more fluids. Drinking more makes your mucus thinner, which may make it easier to breathe.  Taking a medicine for a fever or cough.  Taking an antibiotic medicine.  Using an inhaler to help improve shortness of breath and to control a cough.  Using a cool mist vaporizer or humidifier to make it easier to breathe. Follow these instructions at home: Medicines  Take over-the-counter and prescription medicines only as told by your health care provider.  If you were prescribed an antibiotic, take it as told by your health care provider. Do not stop taking the antibiotic even if you start to feel better. General instructions  Get plenty of rest.  Drink enough fluids to keep your urine clear or pale yellow.  Avoid smoking and secondhand smoke. Exposure to cigarette smoke or irritating chemicals will make bronchitis worse. If you smoke and you need help quitting, ask your health care provider. Quitting smoking will help your lungs heal faster.  Use an inhaler, cool mist vaporizer, or humidifier as told by your health care provider.  Keep all follow-up visits as told by your health care provider. This is important. How is this prevented? To lower your risk of getting this condition again:  Wash your hands often with soap and water. If soap and water are not available, use hand sanitizer.  Avoid contact with people who have cold symptoms.  Try not to touch your hands to your mouth, nose, or eyes.    mouth, nose, or eyes.  Make sure to get the flu shot every year.  Contact a health care provider if:  Your symptoms do not improve in 2 weeks of treatment. Get help right away if:  You cough up blood.  You have chest pain.  You have severe shortness of breath.  You become dehydrated.  You faint or keep feeling like you are going to faint.  You keep vomiting.  You have a  severe headache.  Your fever or chills gets worse. This information is not intended to replace advice given to you by your health care provider. Make sure you discuss any questions you have with your health care provider. Document Released: 09/03/2004 Document Revised: 02/19/2016 Document Reviewed: 01/15/2016 Elsevier Interactive Patient Education  2017 Else[employer] Inc[employer]  Upper Respiratory Infection, Adult Most upper respiratory infections (URIs) are a viral infection of the air passages leading to the lungs. A URI affects the nose, throat, and upper air passages. The most common type of URI is nasopharyngitis and is typically referred to as "the common cold." URIs run their course and usually go away on their own. Most of the time, a URI does not require medical attention, but sometimes a bacterial infection in the upper airways can follow a viral infection. This is called a secondary infection. Sinus and middle ear infections are common types of secondary upper respiratory infections. Bacterial pneumonia can also complicate a URI. A URI can worsen asthma and chronic obstructive pulmonary disease (COPD). Sometimes, these complications can require emergency medical care and may be life threatening. What are the causes? Almost all URIs are caused by viruses. A virus is a type of germ and can spread from one person to another. What increases the risk? You may be at risk for a URI if:  You smoke.  You have chronic heart or lung disease.  You have a weakened defense (immune) system.  You are very young or very old.  You have nasal allergies or asthma.  You work in crowded or poorly ventilated areas.  You work in health care facilities or schools.  What are the signs or symptoms? Symptoms typically develop 2-3 days after you come in contact with a cold virus. Most viral URIs last 7-10 days. However, viral URIs from the influenza virus (flu virus) can last 14-18 days and are typically more  severe. Symptoms may include:  Runny or stuffy (congested) nose.  Sneezing.  Cough.  Sore throat.  Headache.  Fatigue.  Fever.  Loss of appetite.  Pain in your forehead, behind your eyes, and over your cheekbones (sinus pain).  Muscle aches.  How is this diagnosed? Your health care provider may diagnose a URI by:  Physical exam.  Tests to check that your symptoms are not due to another condition such as: ? Strep throat. ? Sinusitis. ? Pneumonia. ? Asthma.  How is this treated? A URI goes away on its own with time. It cannot be cured with medicines, but medicines may be prescribed or recommended to relieve symptoms. Medicines may help:  Reduce your fever.  Reduce your cough.  Relieve nasal congestion.  Follow these instructions at home:  Take medicines only as directed by your health care provider.  Gargle warm saltwater or take cough drops to comfort your throat as directed by your health care provider.  Use a warm mist humidifier or inhale steam from a shower to increase air moisture. This may make it easier to breathe.  Drink enough fluid to keep   your urine clear or pale yellow.  Eat soups and other clear broths and maintain good nutrition.  Rest as needed.  Return to work when your temperature has returned to normal or as your health care provider advises. You may need to stay home longer to avoid infecting others. You can also use a face mask and careful hand washing to prevent spread of the virus.  Increase the usage of your inhaler if you have asthma.  Do not use any tobacco products, including cigarettes, chewing tobacco, or electronic cigarettes. If you need help quitting, ask your health care provider. How is this prevented? The best way to protect yourself from getting a cold is to practice good hygiene.  Avoid oral or hand contact with people with cold symptoms.  Wash your hands often if contact occurs.  There is no clear evidence that  vitamin C, vitamin E, echinacea, or exercise reduces the chance of developing a cold. However, it is always recommended to get plenty of rest, exercise, and practice good nutrition. Contact a health care provider if:  You are getting worse rather than better.  Your symptoms are not controlled by medicine.  You have chills.  You have worsening shortness of breath.  You have brown or red mucus.  You have yellow or brown nasal discharge.  You have pain in your face, especially when you bend forward.  You have a fever.  You have swollen neck glands.  You have pain while swallowing.  You have white areas in the back of your throat. Get help right away if:  You have severe or persistent: ? Headache. ? Ear pain. ? Sinus pain. ? Chest pain.  You have chronic lung disease and any of the following: ? Wheezing. ? Prolonged cough. ? Coughing up blood. ? A change in your usual mucus.  You have a stiff neck.  You have changes in your: ? Vision. ? Hearing. ? Thinking. ? Mood. This information is not intended to replace advice given to you by your health care provider. Make sure you discuss any questions you have with your health care provider. Document Released: 01/20/2001 Document Revised: 03/29/2016 Document Reviewed: 11/01/2013 Elsevier Interactive Patient Education  2017 Elsevier Inc.  

## 2016-06-29 ENCOUNTER — Telehealth: Payer: Self-pay | Admitting: Family Medicine

## 2016-06-29 DIAGNOSIS — R05 Cough: Secondary | ICD-10-CM

## 2016-06-29 DIAGNOSIS — R059 Cough, unspecified: Secondary | ICD-10-CM

## 2016-06-29 MED ORDER — BENZONATATE 200 MG PO CAPS: 200.0000 mg | ORAL_CAPSULE | Freq: Three times a day (TID) | ORAL | 0 refills | Status: DC | PRN

## 2016-06-29 NOTE — Telephone Encounter (Signed)
Pt was in Saturday seen [place].  Cough has gotten worse.  Wants to know if Sonia Baller will call in a cough medication.  [facility] pharmacy   Thanks Con Memos

## 2016-06-29 NOTE — Telephone Encounter (Signed)
Sent in tessalon perles

## 2016-06-29 NOTE — Telephone Encounter (Signed)
Patient advised as below.  

## 2016-06-29 NOTE — Telephone Encounter (Signed)
Please review.  Thanks,  -Joseline 

## 2016-07-16 DIAGNOSIS — L718 Other rosacea: Secondary | ICD-10-CM | POA: Diagnosis not present

## 2016-07-16 DIAGNOSIS — D229 Melanocytic nevi, unspecified: Secondary | ICD-10-CM | POA: Diagnosis not present

## 2016-07-16 DIAGNOSIS — L400 Psoriasis vulgaris: Secondary | ICD-10-CM | POA: Diagnosis not present

## 2016-07-16 DIAGNOSIS — D485 Neoplasm of uncertain behavior of skin: Secondary | ICD-10-CM | POA: Diagnosis not present

## 2016-07-16 DIAGNOSIS — L821 Other seborrheic keratosis: Secondary | ICD-10-CM | POA: Diagnosis not present

## 2016-07-16 DIAGNOSIS — Z86018 Personal history of other benign neoplasm: Secondary | ICD-10-CM

## 2016-07-16 DIAGNOSIS — D225 Melanocytic nevi of trunk: Secondary | ICD-10-CM | POA: Diagnosis not present

## 2016-07-16 HISTORY — DX: Personal history of other benign neoplasm: Z86.018

## 2016-07-27 DIAGNOSIS — M4607 Spinal enthesopathy, lumbosacral region: Secondary | ICD-10-CM | POA: Diagnosis not present

## 2016-07-27 DIAGNOSIS — M62830 Muscle spasm of back: Secondary | ICD-10-CM | POA: Diagnosis not present

## 2016-07-27 DIAGNOSIS — M9903 Segmental and somatic dysfunction of lumbar region: Secondary | ICD-10-CM | POA: Diagnosis not present

## 2016-07-27 DIAGNOSIS — M4608 Spinal enthesopathy, sacral and sacrococcygeal region: Secondary | ICD-10-CM | POA: Diagnosis not present

## 2016-07-29 DIAGNOSIS — M778 Other enthesopathies, not elsewhere classified: Secondary | ICD-10-CM | POA: Diagnosis not present

## 2016-07-29 DIAGNOSIS — L4050 Arthropathic psoriasis, unspecified: Secondary | ICD-10-CM | POA: Diagnosis not present

## 2016-08-31 DIAGNOSIS — M4607 Spinal enthesopathy, lumbosacral region: Secondary | ICD-10-CM | POA: Diagnosis not present

## 2016-08-31 DIAGNOSIS — M62830 Muscle spasm of back: Secondary | ICD-10-CM | POA: Diagnosis not present

## 2016-08-31 DIAGNOSIS — M4608 Spinal enthesopathy, sacral and sacrococcygeal region: Secondary | ICD-10-CM | POA: Diagnosis not present

## 2016-08-31 DIAGNOSIS — M9903 Segmental and somatic dysfunction of lumbar region: Secondary | ICD-10-CM | POA: Diagnosis not present

## 2016-09-10 ENCOUNTER — Ambulatory Visit (HOSPITAL_BASED_OUTPATIENT_CLINIC_OR_DEPARTMENT_OTHER): Payer: Self-pay | Admitting: Pharmacist

## 2016-09-10 DIAGNOSIS — Z79899 Other long term (current) drug therapy: Secondary | ICD-10-CM

## 2016-09-10 NOTE — Progress Notes (Signed)
   S: Patient presents to [facility] for review of their specialty medication therapy.  Patient is currently taking Humira for psoriatic arthirits . Patient is managed by Dr. Jefm Bryant for this.   Adherence: denies any missed doses - she just took her first dose less than 2 weeks ago.  Efficacy: improved knee pain but no changes in skin yet  Dosing:  Psoriatic arthritis: SubQ: 40 mg every other week (may continue methotrexate, other nonbiologic DMARDS, corticosteroids, NSAIDs and/or analgesics)  Dose adjustments: Renal: no dose adjustments (has not been studied) Hepatic: no dose adjustments (has not been studied)  Drug-drug interactions: none  Screening: TB test: completed per patient Hepatitis: completed per patient  Monitoring: S/sx of infection: denies CBC: WNL S/sx of hypersensitivity: denies S/sx of malignancy: denies S/sx of heart failure: denies  O:     Lab Results  Component Value Date   WBC 8.4 01/30/2016   HCT 39.9 01/30/2016   MCV 85 01/30/2016   PLT 310 01/30/2016      Chemistry      Component Value Date/Time   NA 145 (H) 01/30/2016 0931   K 3.7 01/30/2016 0931   CL 99 01/30/2016 0931   CO2 22 01/30/2016 0931   BUN 16 01/30/2016 0931   CREATININE 0.57 01/30/2016 0931      Component Value Date/Time   CALCIUM 9.1 01/30/2016 0931   ALKPHOS 79 01/30/2016 0931   AST 19 01/30/2016 0931   ALT 13 01/30/2016 0931   BILITOT 0.3 01/30/2016 0931       A/P: 1. Medication review: Patient currently on Humira for psoriatic arthritis with no adverse effects - still too early to determine efficacy. Reviewed the medication with the patient, including the following: Humira is a TNF blocking agent indicated for ankylosing spondylitis, Crohn's disease, Hidradenitis suppurativa, psoriatic arthritis, plaque psoriasis, ulcerative colitis, and uveitis. The most common adverse effects are infections, headache, and injection site reactions. There  is the possibility of an increased risk of malignancy but it is not well understood if this increased risk is due to there medication or the disease state. There are rare cases of pancytopenia and aplastic anemia. No recommendations for any changes.   Christella Hartigan, PharmD, BCPS, BCACP, [facility] [phone]

## 2016-09-11 ENCOUNTER — Other Ambulatory Visit: Payer: Self-pay | Admitting: Pharmacist

## 2016-09-11 MED ORDER — ADALIMUMAB 40 MG/0.8ML ~~LOC~~ AJKT: 0.8000 mL | AUTO-INJECTOR | SUBCUTANEOUS | 2 refills | Status: DC

## 2016-09-22 MED FILL — HUMIRA PEN 40 MG/0.8ML PNKT: 40 | 28 days supply | Qty: 2 | Fill #0

## 2016-10-08 DIAGNOSIS — L4050 Arthropathic psoriasis, unspecified: Secondary | ICD-10-CM | POA: Diagnosis not present

## 2016-10-08 DIAGNOSIS — M7752 Other enthesopathy of left foot: Secondary | ICD-10-CM | POA: Diagnosis not present

## 2016-10-09 DIAGNOSIS — Z79899 Other long term (current) drug therapy: Secondary | ICD-10-CM | POA: Diagnosis not present

## 2016-10-09 DIAGNOSIS — S96912A Strain of unspecified muscle and tendon at ankle and foot level, left foot, initial encounter: Secondary | ICD-10-CM | POA: Diagnosis not present

## 2016-10-23 MED FILL — HUMIRA PEN 40 MG/0.8ML PNKT: 40 | 28 days supply | Qty: 2 | Fill #1

## 2016-10-26 DIAGNOSIS — M62830 Muscle spasm of back: Secondary | ICD-10-CM | POA: Diagnosis not present

## 2016-10-26 DIAGNOSIS — M9903 Segmental and somatic dysfunction of lumbar region: Secondary | ICD-10-CM | POA: Diagnosis not present

## 2016-10-26 DIAGNOSIS — M4607 Spinal enthesopathy, lumbosacral region: Secondary | ICD-10-CM | POA: Diagnosis not present

## 2016-11-09 ENCOUNTER — Ambulatory Visit
Admission: RE | Admit: 2016-11-09 | Discharge: 2016-11-09 | Disposition: A | Discharge status: Home / Self Care | Payer: 59 | Source: Ambulatory Visit | Attending: Physician Assistant | Admitting: Physician Assistant

## 2016-11-09 ENCOUNTER — Ambulatory Visit (INDEPENDENT_AMBULATORY_CARE_PROVIDER_SITE_OTHER): Payer: 59 | Admitting: Physician Assistant

## 2016-11-09 ENCOUNTER — Encounter: Payer: Self-pay | Admitting: Physician Assistant

## 2016-11-09 VITALS — BP 120/88 | HR 94 | Temp 98.3°F | Resp 16 | Wt 319.6 lb

## 2016-11-09 DIAGNOSIS — M7989 Other specified soft tissue disorders: Secondary | ICD-10-CM | POA: Diagnosis not present

## 2016-11-09 DIAGNOSIS — M25572 Pain in left ankle and joints of left foot: Secondary | ICD-10-CM | POA: Insufficient documentation

## 2016-11-09 DIAGNOSIS — M76822 Posterior tibial tendinitis, left leg: Secondary | ICD-10-CM | POA: Diagnosis not present

## 2016-11-09 NOTE — Progress Notes (Signed)
Patient: Kelsey Randolph Female    DOB: Nov 10, 1975   41 y.o.   MRN: [id number] Visit Date: 11/09/2016  Today's Provider: Mar Daring, PA-C   Chief Complaint  Patient presents with  . Sprained Ankle   Subjective:    HPI Ankle Pain: Patient complains of injury to the left ankle. This is evaluated as a personal injury. The injury occurred 1 months ago, and occurred while she was using the Elliptical.  She did not hear or sense a pop or snap at the time of the injury. The patient notes pain and off and on and has swelling of the ankle/feet since the end of February. She has treated the ankle with ice, brace, and Meloxicam, and when not doing Meloxicam she has used Ibuprofen. Pain is localized to the medial malleolar area.  Dr. Vickki Muff has seen the patient for this. She is currently on prednisone and has 3 days left.     No Known Allergies   Current Outpatient Prescriptions:  .  Adalimumab (HUMIRA PEN) 40 MG/0.8ML PNKT, Inject 0.8 mLs into the skin every 14 (fourteen) days., Disp: 2 each, Rfl: 2 .  ALPRAZolam (XANAX) 0.5 MG tablet, Take 1 tablet (0.5 mg total) by mouth at bedtime as needed for anxiety., Disp: 90 tablet, Rfl: 3 .  cetirizine (ZYRTEC ALLERGY) 10 MG tablet, Take by mouth., Disp: , Rfl:  .  Cholecalciferol 1000 UNITS capsule, Take by mouth., Disp: , Rfl:  .  escitalopram (LEXAPRO) 20 MG tablet, Take 1 tablet (20 mg total) by mouth daily., Disp: 90 tablet, Rfl: 3 .  fluticasone (FLONASE) 50 MCG/ACT nasal spray, Place 2 sprays into both nostrils daily., Disp: 16 g, Rfl: 6 .  hydrocortisone valerate ointment (WESTCORT) 0.2 %, WESTCORT, 0.2% (External Ointment)  1 Ointment apply daily to affected area for 0 days  Quantity: 45;  Refills: 1   Ordered :19-Mar-2011  Ashley Royalty ;  Started 19-Mar-2011 Active, Disp: , Rfl:  .  levothyroxine (SYNTHROID, LEVOTHROID) 50 MCG tablet, Take 1 tablet (50 mcg total) by mouth daily., Disp: 90 tablet, Rfl: 3 .  meloxicam (MOBIC) 15 MG  tablet, Take 1 tablet (15 mg total) by mouth daily., Disp: 30 tablet, Rfl: 0 .  sulfaSALAzine (AZULFIDINE) 500 MG tablet, Take 500 mg by mouth 2 (two) times daily., Disp: , Rfl:  .  cyclobenzaprine (FLEXERIL) 10 MG tablet, Take 1 tablet (10 mg total) by mouth 3 (three) times daily as needed for muscle spasms. (Patient not taking: Reported on 09/10/2016), Disp: 30 tablet, Rfl: 0 .  Nutritional Supplements (ANTI-INFLAMMATORY ENZYME PO), Take 2 capsules by mouth daily., Disp: , Rfl:   Review of Systems  Constitutional: Negative.   Respiratory: Negative.   Cardiovascular: Negative.   Musculoskeletal: Positive for arthralgias.  Neurological: Negative for weakness and numbness.    Social History  Substance Use Topics  . Smoking status: Former Smoker    Quit date: 08/10/2015  . Smokeless tobacco: Never Used  . Alcohol use No   Objective:   BP 120/88 (BP Location: Right Wrist, Patient Position: Sitting, Cuff Size: Normal)   Pulse 94   Temp 98.3 F (36.8 C) (Oral)   Resp 16   Wt (!) 319 lb 9.6 oz (145 kg)   BMI 56.61 kg/m  Vitals:   11/09/16 0834  BP: 120/88  Pulse: 94  Resp: 16  Temp: 98.3 F (36.8 C)  TempSrc: Oral  Weight: (!) 319 lb 9.6 oz (145 kg)  Physical Exam  Constitutional: She appears well-developed and well-nourished. No distress.  Neck: Normal range of motion. Neck supple. No JVD present. No tracheal deviation present. No thyromegaly present.  Cardiovascular: Normal rate, regular rhythm and normal heart sounds.  Exam reveals no gallop and no friction rub.   No murmur heard. Pulmonary/Chest: Effort normal and breath sounds normal. No respiratory distress. She has no wheezes. She has no rales.  Musculoskeletal:       Right ankle: Normal.       Left ankle: She exhibits swelling. She exhibits normal range of motion. Tenderness. Achilles tendon normal.       Feet:  Lymphadenopathy:    She has no cervical adenopathy.  Skin: She is not diaphoretic.  Vitals  reviewed.       Assessment & Plan:     1. Acute left ankle pain Will get imaging as below to R/O possible stress fracture. Patient has been increasing exercise over the last year and pushed herself really hard at the end of February doing exercise on the elliptical. She has been seen by Dr. Vickki Muff and has a f/u scheduled for 11/17/16 with him. She is currently on prednisone taper. I will f/u with her pending results of imaging. Advised patient to continue rest, ice, compression and elevation. May restart Meloxicam once prednisone taper completed. Discussed doing ice massages and biofreeze. She is to call if no improvement. - DG Ankle Complete Left; Future  2. Posterior tibial tendonitis, left Suspect ankle pain to be from posterior tibialis tendinopathy due to location. Possible anterior tibialis tendon involvement.       Mar Daring, PA-C  [facility]

## 2016-11-09 NOTE — Patient Instructions (Addendum)
Posterior Tibialis Tendinosis Posterior tibialis tendinosis is irritation and degeneration of a tendon called the posterior tibial tendon. Your posterior tibial tendon is a cord-like tissue that connects bones of your lower leg and foot to a muscle that:  Supports your arch.  Helps you raise up on your toes.  Helps you turn your foot down and in. This condition causes foot and ankle pain and can lead to a flat foot. What are the causes? This condition is most often caused by repeated stress to the tendon (overuse injury). It can also be caused by a sudden injury that stresses the tendon, such as landing on your foot after jumping or falling. What increases the risk? This condition is more likely to develop in:  People who play a sport that involves putting a lot of pressure on the feet, such as:  Basketball.  Tennis.  Soccer.  Hockey.  Runners.  Females who are older than 40 years and are overweight.  People with diabetes.  People with decreased foot stability (ligamentous laxity).  People with flat feet. What are the signs or symptoms? Symptoms of this condition may start suddenly or gradually. Symptoms include:  Pain in the inner ankle.  Pain at the arch of your foot.  Pain that gets worse with running, walking, or standing.  Swelling on the inside of your ankle and foot.  Weakness in your ankle or foot.  Inability to stand up on tiptoe. How is this diagnosed? This condition may be diagnosed based on:  Your symptoms.  Your medical history.  A physical exam.  Tests, such as:  An X-ray.  MRI.  An ultrasound. During the physical exam, your health care provider may move your foot and ankle, test your strength and balance, and check the arch of your foot while you stand or walk. How is this treated? This condition may be treated by:  Replacing high-impact exercise with low-impact exercise, such as swimming or cycling.  Applying ice to the injured  area.  Taking an anti-inflammatory pain medicine.  Physical therapy.  Wearing a special shoe or shoe insert to support your arch (orthotic). If your symptoms do not improve with these treatments, you may need to wear a splint, removable walking boot, or short leg cast for 6-8 weeks to keep your foot and ankle still. Follow these instructions at home: If you have a boot or splint:   Wear the boot or splint as told by your health care provider. Remove it only as told by your health care provider.  Do not use your foot to support (bear) your full body weight until your health care provider says that you can.  Loosen the boot or splint if your toes tingle, become numb, or turn cold and blue.  Keep the boot or splint clean.  If your boot or splint is not waterproof:  Do not let it get wet.  Cover it with a watertight plastic bag when you take a bath or shower. If you have a cast:   Do not stick anything inside the cast to scratch your skin. Doing that increases your risk of infection.  Check the skin around the cast every day. Tell your health care provider about any concerns.  You may put lotion on dry skin around the edges of the cast. Do not apply lotion to the skin underneath the cast.  Keep the cast clean.  Do not take baths, swim, or use a hot tub until your health care provider approves. Ask  your health care provider if you can take showers. You may only be allowed to take sponge baths for bathing.  If your cast is not waterproof:  Do not let it get wet.  Cover it with a watertight plastic bag while you take a bath or a shower. Managing pain and swelling   Take over-the-counter and prescription medicines only as told by your health care provider.  If directed, apply ice to the injured area:  Put ice in a plastic bag.  Place a towel between your skin and the bag.  Leave the ice on for 20 minutes, 2-3 times a day.  Raise (elevate) your ankle above the level of  your heart when resting if you have swelling. Activity   Do not do activities that make pain or swelling worse.  Return to full activity gradually as symptoms improve.  Do exercises as told by your health care provider. General instructions   If you have an orthotic, use it as told by your health care provider.  Keep all follow-up visits as told by your health care provider. This is important. How is this prevented?  Wear footwear that is appropriate to your athletic activity.  Avoid athletic activities that cause pain or swelling in your ankle or foot.  Before being active, do range-of-motion and stretching exercises.  If you develop pain or swelling while training, stop training.  If you have pain or swelling that does not improve after a few days of rest, see your health care provider.  If you start a new athletic activity, start gradually so you can build up your strength and flexibility. Contact a health care provider if:  Your symptoms get worse.  Your symptoms do not improve in 6-8 weeks.  You develop new, unexplained symptoms.  Your splint, boot, or cast gets damaged. This information is not intended to replace advice given to you by your health care provider. Make sure you discuss any questions you have with your health care provider. Document Released: 07/27/2005 Document Revised: 03/31/2016 Document Reviewed: 04/12/2015 Elsevier Interactive Patient Education  2017 [employer].

## 2016-11-09 NOTE — Progress Notes (Signed)
Advised  ED 

## 2016-11-17 DIAGNOSIS — M76822 Posterior tibial tendinitis, left leg: Secondary | ICD-10-CM | POA: Diagnosis not present

## 2016-11-20 MED FILL — HUMIRA PEN 40 MG/0.8ML PNKT: 40 | 28 days supply | Qty: 2 | Fill #2

## 2016-11-23 ENCOUNTER — Telehealth: Payer: Self-pay | Admitting: Physician Assistant

## 2016-11-23 DIAGNOSIS — M25572 Pain in left ankle and joints of left foot: Secondary | ICD-10-CM

## 2016-11-23 DIAGNOSIS — M76822 Posterior tibial tendinitis, left leg: Secondary | ICD-10-CM

## 2016-11-23 NOTE — Telephone Encounter (Signed)
Please Review.  Thanks,  -Kimyatta Lecy 

## 2016-11-23 NOTE — Telephone Encounter (Signed)
Referral to podiatry placed

## 2016-11-23 NOTE — Telephone Encounter (Signed)
Pt is requesting a referral to see a podiatrist in [place].  CB#[phone]/MW

## 2016-11-25 DIAGNOSIS — M12271 Villonodular synovitis (pigmented), right ankle and foot: Secondary | ICD-10-CM | POA: Diagnosis not present

## 2016-11-25 DIAGNOSIS — M65872 Other synovitis and tenosynovitis, left ankle and foot: Secondary | ICD-10-CM | POA: Diagnosis not present

## 2016-11-25 DIAGNOSIS — M79672 Pain in left foot: Secondary | ICD-10-CM | POA: Diagnosis not present

## 2016-11-25 DIAGNOSIS — M7731 Calcaneal spur, right foot: Secondary | ICD-10-CM | POA: Diagnosis not present

## 2016-11-25 DIAGNOSIS — M12272 Villonodular synovitis (pigmented), left ankle and foot: Secondary | ICD-10-CM | POA: Diagnosis not present

## 2016-11-25 DIAGNOSIS — M25572 Pain in left ankle and joints of left foot: Secondary | ICD-10-CM | POA: Diagnosis not present

## 2016-11-25 DIAGNOSIS — M25571 Pain in right ankle and joints of right foot: Secondary | ICD-10-CM | POA: Diagnosis not present

## 2016-11-25 DIAGNOSIS — M7732 Calcaneal spur, left foot: Secondary | ICD-10-CM | POA: Diagnosis not present

## 2016-11-25 DIAGNOSIS — M722 Plantar fascial fibromatosis: Secondary | ICD-10-CM | POA: Diagnosis not present

## 2016-11-25 DIAGNOSIS — M79671 Pain in right foot: Secondary | ICD-10-CM | POA: Diagnosis not present

## 2016-11-30 ENCOUNTER — Ambulatory Visit: Payer: Self-pay | Admitting: Podiatry

## 2016-12-03 ENCOUNTER — Other Ambulatory Visit: Payer: Self-pay | Admitting: Podiatry

## 2016-12-03 DIAGNOSIS — M76822 Posterior tibial tendinitis, left leg: Secondary | ICD-10-CM

## 2016-12-07 ENCOUNTER — Telehealth: Payer: 59 | Admitting: Family

## 2016-12-07 ENCOUNTER — Ambulatory Visit: Payer: 59 | Attending: Podiatry

## 2016-12-07 DIAGNOSIS — M6281 Muscle weakness (generalized): Secondary | ICD-10-CM | POA: Insufficient documentation

## 2016-12-07 DIAGNOSIS — M25572 Pain in left ankle and joints of left foot: Secondary | ICD-10-CM | POA: Insufficient documentation

## 2016-12-07 DIAGNOSIS — M25672 Stiffness of left ankle, not elsewhere classified: Secondary | ICD-10-CM | POA: Diagnosis not present

## 2016-12-07 DIAGNOSIS — M545 Low back pain: Secondary | ICD-10-CM | POA: Diagnosis not present

## 2016-12-07 MED ORDER — CYCLOBENZAPRINE HCL 10 MG PO TABS: 10.0000 mg | ORAL_TABLET | Freq: Three times a day (TID) | ORAL | 0 refills | Status: DC | PRN

## 2016-12-07 NOTE — Therapy (Signed)
[facility] [address], [place], [address] Phone: [phone]   Fax:  [phone]  Physical Therapy Evaluation  Patient Details  Name: Kelsey Randolph MRN: [id number] Date of Birth: 1975/09/09 Referring Provider: Silverio Decamp MD  Encounter Date: 12/07/2016      PT End of Session - 12/07/16 0930    Visit Number 1   Number of Visits 12   Date for PT Re-Evaluation 01/18/17   PT Start Time 0800   PT Stop Time 0900   PT Time Calculation (min) 60 min   Activity Tolerance Patient tolerated treatment well   Behavior During Therapy [facility] for tasks assessed/performed      Past Medical History:  Diagnosis Date  . Allergy   . Anxiety   . Depression   . GERD (gastroesophageal reflux disease)     Past Surgical History:  Procedure Laterality Date  . FOOT SURGERY Left   . kidney stones removed  2004  . tonsillectomy and adenoidectomy  1993    There were no vitals filed for this visit.       Subjective Assessment - 12/07/16 0819    Subjective Patient is a 41 yo presenting with L foot/ankle pain. Patient reports increased aggravation of symptoms with walking and weight bearing activies, stairs, and vacuuming. (Patinet reports pain at all times - never is a 0/10 on VAS). Patient is currently wearing a walking boot that she received from Dr. Vickki Muff who instructed her to ambulate as at all times especially when weight bearing. Patient reports decreased aggravation with compression, advil and ice. Patient reports increased pain at end of the day after use. Patient reports worse pain in the ankle 7/10 and best pain is a 3/10. Currently pain is a 3/10.   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 32min   Diagnostic tests X-Ray   Patient Stated Goals Walk without pain   Currently in Pain? Yes   Pain Score 3    Pain Location Ankle    Pain Orientation Left   Pain Descriptors / Indicators Burning   Pain Onset More than a month ago   Pain Frequency Constant   Aggravating Factors  walking   Pain Relieving Factors ice compression            [facility] PT Assessment - 12/07/16 0815      Assessment   Medical Diagnosis Tibialia posterior tendonitis   Referring Provider Silverio Decamp MD   Onset Date/Surgical Date 10/07/16   Hand Dominance Right   Next MD Visit 12/17/16   Prior Therapy none     Balance Screen   Has the patient fallen in the past 6 months No   Has the patient had a decrease in activity level because of a fear of falling?  No   Is the patient reluctant to leave their home because of a fear of falling?  No     Prior Function   Level of Independence Independent   Vocation Full time employment   Programme researcher, broadcasting/film/video - walking   Leisure boot camp, exercise     Cognition   Overall Cognitive Status Within Functional Limits for tasks assessed     ROM / Strength   AROM / PROM / Strength AROM;Strength     AROM   AROM Assessment Site Hip;Knee;Ankle   Right/Left Hip Right;Left   Right Hip Extension 20  Right Hip Flexion 100   Right Hip ABduction 40   Left Hip ABduction 40   Right/Left Knee Right;Left   Right Knee Extension 0   Right Knee Flexion 120   Left Knee Extension 0   Left Knee Flexion 120   Right/Left Ankle Right;Left   Right Ankle Dorsiflexion 10   Right Ankle Plantar Flexion 60   Right Ankle Inversion 50   Right Ankle Eversion 20   Left Ankle Dorsiflexion 6   Left Ankle Plantar Flexion 60   Left Ankle Inversion 55   Left Ankle Eversion 10     Strength   Strength Assessment Site Hip;Knee;Ankle   Right/Left Hip Right;Left   Right Hip Flexion 5/5   Right Hip Extension 4/5   Right Hip ABduction 4+/5   Right Hip ADduction 5/5   Left Hip Flexion 4-/5   Left Hip Extension 4-/5   Left Hip ABduction 4-/5   Left Hip ADduction 4+/5   Right/Left Knee Right;Left   Right  Knee Flexion 4+/5   Right Knee Extension 5/5   Left Knee Flexion 4+/5   Left Knee Extension 4/5   Right/Left Ankle Right;Left   Right Ankle Dorsiflexion 5/5   Right Ankle Plantar Flexion 4/5   Right Ankle Inversion 5/5   Right Ankle Eversion 5/5   Left Ankle Dorsiflexion 4-/5  Decreased pain   Left Ankle Plantar Flexion 4-/5  increased pain   Left Ankle Inversion 4-/5  Pain   Left Ankle Eversion 4-/5     Palpation   Palpation comment Increased TTP along tibialis posterior tendon (base of cuneiform)/muscle belly; increased vastus lateralis pain and spasms     Special Tests    Special Tests Ankle/Foot Special Tests   Ankle/Foot Special Tests  Anterior Drawer Test;Talar Tilt Test;Great Toe Extension Test  Heel lift off test - increased pain on L     Anterior Drawer Test   Findings Negative   Side  Left     Talar Tilt Test    Findings Negative   Side  Left     Great Toe Extension Test    Comments Negative on L     Ambulation/Gait   Gait Comments [place] of arches bilaterally with weight bearing, bilateral external rotation with steps taken      Observation: LEFS: 40/80   TREATMENT: Isometrics ankle inversion/plantarflexion; 5 sec hold x 10 in each direction   Response to Treatment: Decreased pain after performing ankle isometric exercises.          PT Education - 12/07/16 0930    Education provided Yes   Education Details HEP: isometrics for plantarflexion and inversion   Person(s) Educated Patient   Methods Explanation;Demonstration   Comprehension Verbalized understanding;Returned demonstration             PT Long Term Goals - 12/07/16 3716      PT LONG TERM GOAL #1   Title Patient will be independent with HEP focused on improving ankle function to continue benefits of therapy after discharge.   Baseline dependent with form/technique   Time 6   Period Weeks   Status New     PT LONG TERM GOAL #2   Title Patient will be able to perform boot  camp exercise program without pain to demonstrate significant improvement in ankle function.   Baseline unable   Time 6   Period Weeks   Status New     PT LONG TERM GOAL #3   Title Patient will improve  LEFS to 64/80 to demonstrate significant improvement in foot/ankle function and greater ability to walk without pain.    Baseline 40/80   Time 6   Period Weeks   Status New               Plan - 12/07/16 0931    Clinical Impression Statement Patient is a 41 yo right hand dominant female presenting with increased L ankle pain/dysfunction with possible posterior tibialias posterior muscle/tendon involvement indicated by pain with inversion/plantarflexion and increased pain with palpation. Patient's increased ankle pain/dysfunction is indicated by decreased manual muscle testing scores, increased pain with palpaion/performance of functional activities, and decreased LEFS score. Patinet also demonstrates decreased muscular endurance and coordination and will benefit from further skilled therapy to return to prior level of function.    Rehab Potential Good   Clinical Impairments Affecting Rehab Potential (+) highly motivated (-) pain aggrvation with weight bearing on LEs   PT Frequency 2x / week   PT Duration 6 weeks   PT Treatment/Interventions Cryotherapy;Electrical Stimulation;Iontophoresis 4mg /ml Dexamethasone;Moist Heat;Ultrasound;Therapeutic exercise;Therapeutic activities;Gait training;Balance training;Neuromuscular re-education;Patient/family education;Manual techniques;Dry needling;Passive range of motion   PT Next Visit Plan Progress strengthening on the L LE and hip; coordination of foot/ankle musculature   PT Home Exercise Plan isometrics plantarflexion/ inversion   Consulted and Agree with Plan of Care Patient      Patient will benefit from skilled therapeutic intervention in order to improve the following deficits and impairments:  Abnormal gait, Increased fascial  restricitons, Pain, Decreased coordination, Decreased mobility, Decreased endurance, Decreased range of motion, Decreased strength, Difficulty walking  Visit Diagnosis: Pain in left ankle and joints of left foot  Stiffness of left ankle, not elsewhere classified  Muscle weakness (generalized)     Problem List Patient Active Problem List   Diagnosis Date Noted  . Abnormal blood chemistry 01/28/2015  . Allergic rhinitis 01/28/2015  . Anxiety 01/28/2015  . Chest pain 01/28/2015  . Family planning 01/28/2015  . Acid reflux 01/28/2015  . LBP (low back pain) 01/28/2015  . Plantar fasciitis 01/28/2015  . Borderline diabetes 01/28/2015  . Psoriasis 01/28/2015  . Avitaminosis D 01/28/2015  . Disease of white blood cells 11/22/2009  . Awareness of heartbeats 03/23/2008  . Adult hypothyroidism 03/14/2008  . History of tobacco use 11/30/2007  . Calculus of kidney 11/23/2007  . 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 Clinical depression 03/04/2001  . Extreme obesity 08/10/1998    Blythe Stanford, PT DPT 12/07/2016, 9:43 AM  [facility] [address] [place], [address] Phone: [phone]   Fax:  [phone]  Name: Kelsey Randolph MRN: [id number] Date of Birth: [birth year]

## 2016-12-07 NOTE — Progress Notes (Signed)

## 2016-12-10 ENCOUNTER — Ambulatory Visit
Admission: RE | Admit: 2016-12-10 | Discharge: 2016-12-10 | Disposition: A | Discharge status: Home / Self Care | Payer: 59 | Source: Ambulatory Visit | Attending: Podiatry | Admitting: Podiatry

## 2016-12-10 ENCOUNTER — Ambulatory Visit: Payer: 59 | Attending: Podiatry

## 2016-12-10 DIAGNOSIS — M76822 Posterior tibial tendinitis, left leg: Secondary | ICD-10-CM | POA: Diagnosis present

## 2016-12-10 DIAGNOSIS — M216X2 Other acquired deformities of left foot: Secondary | ICD-10-CM | POA: Insufficient documentation

## 2016-12-10 DIAGNOSIS — M24272 Disorder of ligament, left ankle: Secondary | ICD-10-CM | POA: Insufficient documentation

## 2016-12-10 DIAGNOSIS — M6281 Muscle weakness (generalized): Secondary | ICD-10-CM | POA: Diagnosis not present

## 2016-12-10 DIAGNOSIS — M7989 Other specified soft tissue disorders: Secondary | ICD-10-CM | POA: Diagnosis not present

## 2016-12-10 DIAGNOSIS — M25672 Stiffness of left ankle, not elsewhere classified: Secondary | ICD-10-CM | POA: Insufficient documentation

## 2016-12-10 DIAGNOSIS — M25572 Pain in left ankle and joints of left foot: Secondary | ICD-10-CM | POA: Diagnosis not present

## 2016-12-10 DIAGNOSIS — M65872 Other synovitis and tenosynovitis, left ankle and foot: Secondary | ICD-10-CM | POA: Diagnosis not present

## 2016-12-10 NOTE — Therapy (Signed)
[facility] [address], [place], [address] Phone: [phone]   Fax:  [phone]  Physical Therapy Treatment  Patient Details  Name: Kelsey Randolph MRN: [id number] Date of Birth: 11/25/1975 Referring Provider: Samara Deist MD  Encounter Date: 12/10/2016      PT End of Session - 12/10/16 1255    Visit Number 2   Number of Visits 12   Date for PT Re-Evaluation 01/18/17   PT Start Time 0240   PT Stop Time 1130   PT Time Calculation (min) 45 min   Activity Tolerance Patient tolerated treatment well   Behavior During Therapy [facility] for tasks assessed/performed      Past Medical History:  Diagnosis Date  . Allergy   . Anxiety   . Depression   . GERD (gastroesophageal reflux disease)     Past Surgical History:  Procedure Laterality Date  . FOOT SURGERY Left   . kidney stones removed  2004  . tonsillectomy and adenoidectomy  1993    There were no vitals filed for this visit.      Subjective Assessment - 12/10/16 1052    Subjective Patient reports decreased pain after performing mobilizations and STM after the previous treatment.   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 75min   Diagnostic tests X-Ray   Patient Stated Goals Walk without pain   Currently in Pain? Yes   Pain Score 3    Pain Location Ankle   Pain Orientation Left   Pain Descriptors / Indicators Burning   Pain Onset More than a month ago      TREATMENT:  Manual therapy: STM to the tibialis posterior on the L LE to decrease increased pain and spasms. Active release techniques to the area to decrease pain, spasms, and decrease trigger points with patient positioned in sitting.  Therapeutic Exercise: Isometrics to the tibialis posterior - for dorsiflexion/plantarflexion - x 20; 5 sec hold Plantarflexion/inversion with yellow theraband -  x20 Alphabet with ankle AROM - lowercase/uppercase x2 BAPS board level 2 forward/backward; laterally; and circles cw, ccw - x20 each direction  Ultrasound: To the tibialis posterior muscle belly and distal tendon, intensity: 2.2 Watts with small sound head, with 1 W/cm2 to decrease pain and spasms along the muscle belly/tendon with patient positioned in sitting for 8 min  Patient responds well to therapy with decreased pain and spasms after performing manual therapy.        PT Education - 12/10/16 1254    Education provided Yes   Education Details form/technique with exercise   Person(s) Educated Patient   Methods Explanation;Demonstration   Comprehension Verbalized understanding;Returned demonstration             PT Long Term Goals - 12/07/16 9735      PT LONG TERM GOAL #1   Title Patient will be independent with HEP focused on improving ankle function to continue benefits of therapy after discharge.   Baseline dependent with form/technique   Time 6   Period Weeks   Status New     PT LONG TERM GOAL #2   Title Patient will be able to perform boot camp exercise program without pain to demonstrate significant improvement in ankle function.   Baseline unable   Time 6   Period Weeks   Status New     PT LONG TERM GOAL #3   Title Patient  will improve LEFS to 64/80 to demonstrate significant improvement in foot/ankle function and greater ability to walk without pain.    Baseline 40/80   Time 6   Period Weeks   Status New               Plan - 12/10/16 1307    Clinical Impression Statement Patient demonstrates improvement in pain after performance of manual therapy indicating decrease in pain and spasms. Patient is able to perform isometrics without pain after performance of manual therapy indicating decrease in muscle spasms and pain. Patient will benefit from further skilled therapy focused on improving tibialis posteior strength to return prior level of function.     Rehab Potential Good   Clinical Impairments Affecting Rehab Potential (+) highly motivated (-) pain aggrvation with weight bearing on LEs   PT Frequency 2x / week   PT Duration 6 weeks   PT Treatment/Interventions Cryotherapy;Electrical Stimulation;Iontophoresis 4mg /ml Dexamethasone;Moist Heat;Ultrasound;Therapeutic exercise;Therapeutic activities;Gait training;Balance training;Neuromuscular re-education;Patient/family education;Manual techniques;Dry needling;Passive range of motion   PT Next Visit Plan Progress strengthening on the L LE and hip; coordination of foot/ankle musculature   PT Home Exercise Plan isometrics plantarflexion/ inversion   Consulted and Agree with Plan of Care Patient      Patient will benefit from skilled therapeutic intervention in order to improve the following deficits and impairments:  Abnormal gait, Increased fascial restricitons, Pain, Decreased coordination, Decreased mobility, Decreased endurance, Decreased range of motion, Decreased strength, Difficulty walking  Visit Diagnosis: Pain in left ankle and joints of left foot  Stiffness of left ankle, not elsewhere classified  Muscle weakness (generalized)     Problem List Patient Active Problem List   Diagnosis Date Noted  . Abnormal blood chemistry 01/28/2015  . Allergic rhinitis 01/28/2015  . Anxiety 01/28/2015  . Chest pain 01/28/2015  . Family planning 01/28/2015  . Acid reflux 01/28/2015  . LBP (low back pain) 01/28/2015  . Plantar fasciitis 01/28/2015  . Borderline diabetes 01/28/2015  . Psoriasis 01/28/2015  . Avitaminosis D 01/28/2015  . Disease of white blood cells 11/22/2009  . Awareness of heartbeats 03/23/2008  . Adult hypothyroidism 03/14/2008  . History of tobacco use 11/30/2007  . Calculus of kidney 11/23/2007  . 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 Clinical depression 03/04/2001  . Extreme obesity 08/10/1998    Blythe Stanford, PT DPT 12/10/2016, 1:21 PM  [facility] [address], [place], [address] Phone: [phone]   Fax:  [phone]  Name: Kelsey Randolph MRN: [id number] Date of Birth: June 14, 1976

## 2016-12-14 ENCOUNTER — Ambulatory Visit: Payer: 59

## 2016-12-14 DIAGNOSIS — M25672 Stiffness of left ankle, not elsewhere classified: Secondary | ICD-10-CM | POA: Diagnosis not present

## 2016-12-14 DIAGNOSIS — M25572 Pain in left ankle and joints of left foot: Secondary | ICD-10-CM | POA: Diagnosis not present

## 2016-12-14 DIAGNOSIS — M6281 Muscle weakness (generalized): Secondary | ICD-10-CM | POA: Diagnosis not present

## 2016-12-14 NOTE — Therapy (Signed)
[facility] [address], [place], [address] Phone: [phone]   Fax:  [phone]  Physical Therapy Treatment  Patient Details  Name: Kelsey Randolph MRN: [id number] Date of Birth: 1975-08-13 Referring Provider: Samara Deist MD  Encounter Date: 12/14/2016      PT End of Session - 12/14/16 1143    Visit Number 3   Number of Visits 12   Date for PT Re-Evaluation 01/18/17   PT Start Time 8588   PT Stop Time 1130   PT Time Calculation (min) 45 min   Activity Tolerance Patient tolerated treatment well   Behavior During Therapy [facility] for tasks assessed/performed      Past Medical History:  Diagnosis Date  . Allergy   . Anxiety   . Depression   . GERD (gastroesophageal reflux disease)     Past Surgical History:  Procedure Laterality Date  . FOOT SURGERY Left   . kidney stones removed  2004  . tonsillectomy and adenoidectomy  1993    There were no vitals filed for this visit.      Subjective Assessment - 12/14/16 1141    Subjective Patient reports the ankle is feeling much improved today. Patient states she spent 6 hours out of the boot this past Saturday and reports decreased pain when she is not wearing the boot.    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 63min   Diagnostic tests X-Ray   Patient Stated Goals Walk without pain   Currently in Pain? Yes   Pain Score 2    Pain Location Ankle   Pain Orientation Left   Pain Descriptors / Indicators Burning   Pain Type Acute pain   Pain Onset More than a month ago        TREATMENT:   Manual therapy: STM to the tibialis posterior on the L LE to decrease increased pain and spasms. Active release techniques to the area to decrease pain, spasms, and decrease trigger points with patient positioned in sitting.   Therapeutic Exercise: Isometrics to the  tibialis posterior - for dorsiflexion/plantarflexion - x 20; 5 sec hold Plantarflexion/inversion, dorsiflexion with red theraband - x20 Alphabet with ankle AROM - lowercase/uppercase x2 Marches on airex pad - x 20 Arch lifts in sitting - x 25  Semitandem stance weight shifts anterior/posterior - x 20 B directions Heel lifts from airex pad-x20  Seated ant/post, lateral, and circular ankle movements in sitting with foot placed on large dynadisc - x 20 each direction   Ultrasound: To the tibialis posterior muscle belly and distal tendon, intensity: 2.2 Watts with small sound head, with 1 W/cm2 to decrease pain and spasms along the muscle belly/tendon with patient positioned in sitting for 8 min   Patient responds well to therapy with increased fatigue after performing therapeutic exercise.       PT Education - 12/14/16 1142    Education provided Yes   Education Details Review HEP with RTB   Person(s) Educated Patient   Methods Explanation   Comprehension Verbalized understanding             PT Long Term Goals - 12/07/16 5027      PT LONG TERM GOAL #1   Title Patient will be independent with HEP focused on improving ankle function to continue benefits of therapy after discharge.   Baseline dependent with form/technique   Time 6  Period Weeks   Status New     PT LONG TERM GOAL #2   Title Patient will be able to perform boot camp exercise program without pain to demonstrate significant improvement in ankle function.   Baseline unable   Time 6   Period Weeks   Status New     PT LONG TERM GOAL #3   Title Patient will improve LEFS to 64/80 to demonstrate significant improvement in foot/ankle function and greater ability to walk without pain.    Baseline 40/80   Time 6   Period Weeks   Status New               Plan - 12/14/16 1143    Clinical Impression Statement Patient demonstrates decreased pain with manual therapy therapy techniques today indicating  improvement in trigger points and muscle spasms. Focused performing exercises into resistance and introducing standing exercises to improve ankle stabilization. Patient demonstrates decreased balance with weight bearing on L LE indicating decreased prioception and patient will benefit from further skilled therapy focused on improving ankle stabilization to return to prior level of function.    Rehab Potential Good   Clinical Impairments Affecting Rehab Potential (+) highly motivated (-) pain aggrvation with weight bearing on LEs   PT Frequency 2x / week   PT Duration 6 weeks   PT Treatment/Interventions Cryotherapy;Electrical Stimulation;Iontophoresis 4mg /ml Dexamethasone;Moist Heat;Ultrasound;Therapeutic exercise;Therapeutic activities;Gait training;Balance training;Neuromuscular re-education;Patient/family education;Manual techniques;Dry needling;Passive range of motion   PT Next Visit Plan Progress strengthening on the L LE and hip; coordination of foot/ankle musculature   PT Home Exercise Plan isometrics plantarflexion/ inversion   Consulted and Agree with Plan of Care Patient      Patient will benefit from skilled therapeutic intervention in order to improve the following deficits and impairments:  Abnormal gait, Increased fascial restricitons, Pain, Decreased coordination, Decreased mobility, Decreased endurance, Decreased range of motion, Decreased strength, Difficulty walking  Visit Diagnosis: Pain in left ankle and joints of left foot  Stiffness of left ankle, not elsewhere classified  Muscle weakness (generalized)     Problem List Patient Active Problem List   Diagnosis Date Noted  . Abnormal blood chemistry 01/28/2015  . Allergic rhinitis 01/28/2015  . Anxiety 01/28/2015  . Chest pain 01/28/2015  . Family planning 01/28/2015  . Acid reflux 01/28/2015  . LBP (low back pain) 01/28/2015  . Plantar fasciitis 01/28/2015  . Borderline diabetes 01/28/2015  . Psoriasis  01/28/2015  . Avitaminosis D 01/28/2015  . Disease of white blood cells 11/22/2009  . Awareness of heartbeats 03/23/2008  . Adult hypothyroidism 03/14/2008  . History of tobacco use 11/30/2007  . Calculus of kidney 11/23/2007  . 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 Clinical depression 03/04/2001  . Extreme obesity 08/10/1998    Blythe Stanford, PT DPT 12/14/2016, 11:47 AM  [place] [address], [place], [address] Phone: [phone]   Fax:  [phone]  Name: Kelsey Randolph MRN: [id number] Date of Birth: [birth year]

## 2016-12-17 ENCOUNTER — Ambulatory Visit: Payer: 59

## 2016-12-17 ENCOUNTER — Other Ambulatory Visit: Payer: Self-pay | Admitting: Podiatry

## 2016-12-17 DIAGNOSIS — M76822 Posterior tibial tendinitis, left leg: Secondary | ICD-10-CM | POA: Diagnosis not present

## 2016-12-18 ENCOUNTER — Ambulatory Visit: Payer: 59

## 2016-12-18 DIAGNOSIS — M6281 Muscle weakness (generalized): Secondary | ICD-10-CM

## 2016-12-18 DIAGNOSIS — M25572 Pain in left ankle and joints of left foot: Secondary | ICD-10-CM

## 2016-12-18 DIAGNOSIS — M25672 Stiffness of left ankle, not elsewhere classified: Secondary | ICD-10-CM

## 2016-12-18 NOTE — Therapy (Addendum)
[place] MAIN [facility] SERVICES [address] [place], [place], [address] Phone: [phone]   Fax:  [phone]  Physical Therapy Treatment  Patient Details  Name: Kelsey Randolph MRN: [id number] Date of Birth: 1976-08-03 Referring Provider: Samara Deist MD  Encounter Date: 12/18/2016      PT End of Session - 12/18/16 1144    Visit Number 4   Number of Visits 12   Date for PT Re-Evaluation 01/18/17   PT Start Time 1000   PT Stop Time 1045   PT Time Calculation (min) 45 min   Activity Tolerance Patient tolerated treatment well   Behavior During Therapy [facility] for tasks assessed/performed      Past Medical History:  Diagnosis Date  . Allergy   . Anxiety   . Depression   . GERD (gastroesophageal reflux disease)     Past Surgical History:  Procedure Laterality Date  . FOOT SURGERY Left   . kidney stones removed  2004  . tonsillectomy and adenoidectomy  1993    There were no vitals filed for this visit.      Subjective Assessment - 12/18/16 1042    Subjective Patient reports she is unsure if she should continue with ankle surgery and is anxious that her ankle will not heal. Patient reports her ankle has been feeling better since the beginning of therapy.    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 10min   Diagnostic tests X-Ray   Patient Stated Goals Walk without pain   Currently in Pain? Yes   Pain Score 3    Pain Location Ankle   Pain Orientation Left   Pain Descriptors / Indicators Burning   Pain Type Acute pain   Pain Onset More than a month ago   Pain Frequency Constant      Manual therapy: STM to the tibialis posterior on the L LE to decrease increased pain and spasms. Active release techniques to the area to decrease pain, spasms, and decrease trigger points with patient positioned in sitting.  Therapeutic  Exercise: Isometrics to the tibialis posterior - for dorsiflexion/plantarflexion - 2x 15; 5 sec hold Plantarflexion/inversion, dorsiflexion with Green theraband - x20 Heel lifts on airex pad with focus on bearing weight over great toe -- x 20 B Arch lifts in sitting - x 25  Single leg stance on airex pad -- 2 x 30sec Standing ant/post, lateral, and circular movements on dynadisc in standing -- x 30 each directoin    Patient demonstrates no increase in pain throughout therapy session       PT Education - 12/18/16 1144    Education provided Yes   Education Details HEP: arch ups in sitting, GTB inversion   Person(s) Educated Patient   Methods Explanation;Demonstration   Comprehension Verbalized understanding;Returned demonstration             PT Long Term Goals - 12/07/16 1324      PT LONG TERM GOAL #1   Title Patient will be independent with HEP focused on improving ankle function to continue benefits of therapy after discharge.   Baseline dependent with form/technique   Time 6   Period Weeks   Status New     PT LONG TERM GOAL #2   Title Patient will be able to perform boot camp exercise program without pain to demonstrate significant improvement in ankle function.   Baseline unable   Time  6   Period Weeks   Status New     PT LONG TERM GOAL #3   Title Patient will improve LEFS to 64/80 to demonstrate significant improvement in foot/ankle function and greater ability to walk without pain.    Baseline 40/80   Time 6   Period Weeks   Status New               Plan - 12/18/16 1145    Clinical Impression Statement Patient continues to demonstrate gradual improvement towards long term goals demonstrating greater ability to weight bear without increase in symptoms and perform ankle AROM when in standing. Educated patient heavily on POC and expectations with therapy and reinforced HEP. Patient able to perform ankle inversion with greater resistance indicating  improved ankle strength/coordination. Patient will benefit from further skilled therapy to return to prior level of function.    Rehab Potential Good   Clinical Impairments Affecting Rehab Potential (+) highly motivated (-) pain aggrvation with weight bearing on LEs   PT Frequency 2x / week   PT Duration 6 weeks   PT Treatment/Interventions Cryotherapy;Electrical Stimulation;Iontophoresis 4mg /ml Dexamethasone;Moist Heat;Ultrasound;Therapeutic exercise;Therapeutic activities;Gait training;Balance training;Neuromuscular re-education;Patient/family education;Manual techniques;Dry needling;Passive range of motion   PT Next Visit Plan Progress strengthening on the L LE and hip; coordination of foot/ankle musculature   PT Home Exercise Plan isometrics plantarflexion/ inversion   Consulted and Agree with Plan of Care Patient      Patient will benefit from skilled therapeutic intervention in order to improve the following deficits and impairments:  Abnormal gait, Increased fascial restricitons, Pain, Decreased coordination, Decreased mobility, Decreased endurance, Decreased range of motion, Decreased strength, Difficulty walking  Visit Diagnosis: Pain in left ankle and joints of left foot  Stiffness of left ankle, not elsewhere classified  Muscle weakness (generalized)     Problem List Patient Active Problem List   Diagnosis Date Noted  . Abnormal blood chemistry 01/28/2015  . Allergic rhinitis 01/28/2015  . Anxiety 01/28/2015  . Chest pain 01/28/2015  . Family planning 01/28/2015  . Acid reflux 01/28/2015  . LBP (low back pain) 01/28/2015  . Plantar fasciitis 01/28/2015  . Borderline diabetes 01/28/2015  . Psoriasis 01/28/2015  . Avitaminosis D 01/28/2015  . Disease of white blood cells 11/22/2009  . Awareness of heartbeats 03/23/2008  . Adult hypothyroidism 03/14/2008  . History of tobacco use 11/30/2007  . Calculus of kidney 11/23/2007  . 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 Clinical depression 03/04/2001  . Extreme obesity 08/10/1998    Blythe Stanford, PT DPT 12/18/2016, 11:48 AM  [place] MAIN [facility] SERVICES [address] [place], [place], [address] Phone: [phone]   Fax:  [phone]  Name: Kelsey Randolph MRN: [id number] Date of Birth: 07/12/1976

## 2016-12-21 ENCOUNTER — Other Ambulatory Visit: Payer: Self-pay | Admitting: Pharmacist

## 2016-12-21 MED ORDER — ADALIMUMAB 40 MG/0.8ML ~~LOC~~ AJKT: 0.8000 mL | AUTO-INJECTOR | SUBCUTANEOUS | 2 refills | Status: DC

## 2016-12-23 ENCOUNTER — Ambulatory Visit: Payer: 59

## 2016-12-24 ENCOUNTER — Ambulatory Visit: Payer: 59

## 2016-12-24 ENCOUNTER — Other Ambulatory Visit: Payer: Self-pay

## 2016-12-24 DIAGNOSIS — M6281 Muscle weakness (generalized): Secondary | ICD-10-CM

## 2016-12-24 DIAGNOSIS — M25672 Stiffness of left ankle, not elsewhere classified: Secondary | ICD-10-CM

## 2016-12-24 DIAGNOSIS — M25572 Pain in left ankle and joints of left foot: Secondary | ICD-10-CM

## 2016-12-24 NOTE — Therapy (Signed)
[name] [facility] [address] [place], [address] Phone: [phone]   Fax:  [phone]  Physical Therapy Treatment  Patient Details  Name: Kelsey Randolph MRN: [id number] Date of Birth: March 28, 1976 Referring Provider: Samara Deist MD  Encounter Date: 12/24/2016      PT End of Session - 12/24/16 1459    Visit Number 5   Number of Visits 12   Date for PT Re-Evaluation 01/18/17   PT Start Time 1430   PT Stop Time 1515   PT Time Calculation (min) 45 min   Activity Tolerance Patient tolerated treatment well   Behavior During Therapy [facility] for tasks assessed/performed      Past Medical History:  Diagnosis Date  . Allergy   . Anxiety   . Depression   . GERD (gastroesophageal reflux disease)     Past Surgical History:  Procedure Laterality Date  . FOOT SURGERY Left   . kidney stones removed  2004  . tonsillectomy and adenoidectomy  1993    There were no vitals filed for this visit.      Subjective Assessment - 12/24/16 1434    Subjective Patient reports she been having less pain and states when the pain onsets it is at a less intensity. Patient reports the worse pain in past week has been a 5/10.    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 64min   Diagnostic tests X-Ray   Patient Stated Goals Walk without pain   Currently in Pain? Yes   Pain Score 2    Pain Location Ankle   Pain Orientation Left   Pain Descriptors / Indicators Burning;Sore   Pain Onset More than a month ago   Pain Frequency Constant      Manual therapy: STM to the tibialis posterior on the L LE to decrease increased pain and spasms. Active release techniques to the area to decrease pain, spasms, and decrease trigger points with patient positioned in sitting.   Therapeutic Exercise: Isometrics to the tibialis posterior - for  dorsiflexion/plantarflexion - x 15; 5 sec hold Single leg stance on airex pad - 2 x 45sec B Heel lifts on airex pad with focus on bearing weight over great toe -- x 20 B Arch lifts in sitting - x 25  Bosu marches with unilateral UE support - 2x20 Standing ant/post, lateral, and circular movements on dynadisc in standing -- x 30 each direction Standing reaches outside of BOS with SLS on airex pad - x8 (3 directions)  Side stepping onto bosu ball - x15 each direction without UE support       PT Education - 12/24/16 1457    Education provided Yes   Education Details Single leg stance for HEP   Person(s) Educated Patient   Methods Explanation;Demonstration   Comprehension Verbalized understanding;Returned demonstration             PT Long Term Goals - 12/07/16 9767      PT LONG TERM GOAL #1   Title Patient will be independent with HEP focused on improving ankle function to continue benefits of therapy after discharge.   Baseline dependent with form/technique   Time 6   Period Weeks   Status New     PT LONG TERM GOAL #2   Title Patient will be able to perform boot camp exercise program without pain to demonstrate significant improvement in ankle  function.   Baseline unable   Time 6   Period Weeks   Status New     PT LONG TERM GOAL #3   Title Patient will improve LEFS to 64/80 to demonstrate significant improvement in foot/ankle function and greater ability to walk without pain.    Baseline 40/80   Time 6   Period Weeks   Status New               Plan - 12/24/16 1503    Clinical Impression Statement Patient demonstrates improvement in motor control with performing ankle mobility exercises indicating functional carryover between visits. Patient continues to demonstrate pain with single leg stance and ankle inversion movement and will benefit from further skilled therapy focused on improving tibialis post tendon dysfunction to return to prior level of function.     Rehab Potential Good   Clinical Impairments Affecting Rehab Potential (+) highly motivated (-) pain aggrvation with weight bearing on LEs   PT Frequency 2x / week   PT Duration 6 weeks   PT Treatment/Interventions Cryotherapy;Electrical Stimulation;Iontophoresis 4mg /ml Dexamethasone;Moist Heat;Ultrasound;Therapeutic exercise;Therapeutic activities;Gait training;Balance training;Neuromuscular re-education;Patient/family education;Manual techniques;Dry needling;Passive range of motion   PT Next Visit Plan Progress strengthening on the L LE and hip; coordination of foot/ankle musculature   PT Home Exercise Plan isometrics plantarflexion/ inversion   Consulted and Agree with Plan of Care Patient      Patient will benefit from skilled therapeutic intervention in order to improve the following deficits and impairments:  Abnormal gait, Increased fascial restricitons, Pain, Decreased coordination, Decreased mobility, Decreased endurance, Decreased range of motion, Decreased strength, Difficulty walking  Visit Diagnosis: Pain in left ankle and joints of left foot  Stiffness of left ankle, not elsewhere classified  Muscle weakness (generalized)     Problem List Patient Active Problem List   Diagnosis Date Noted  . Abnormal blood chemistry 01/28/2015  . Allergic rhinitis 01/28/2015  . Anxiety 01/28/2015  . Chest pain 01/28/2015  . Family planning 01/28/2015  . Acid reflux 01/28/2015  . LBP (low back pain) 01/28/2015  . Plantar fasciitis 01/28/2015  . Borderline diabetes 01/28/2015  . Psoriasis 01/28/2015  . Avitaminosis D 01/28/2015  . Disease of white blood cells 11/22/2009  . Awareness of heartbeats 03/23/2008  . Adult hypothyroidism 03/14/2008  . History of tobacco use 11/30/2007  . Calculus of kidney 11/23/2007  . 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 Clinical depression 03/04/2001  . Extreme obesity 08/10/1998    Blythe Stanford, PT DPT 12/24/2016, 3:18 PM  [place] [facility] [address] [place], [address] Phone: [phone]   Fax:  [phone]  Name: Kelsey Randolph MRN: [id number] Date of Birth: [birth year]

## 2016-12-25 ENCOUNTER — Ambulatory Visit: Payer: 59

## 2016-12-25 DIAGNOSIS — M25672 Stiffness of left ankle, not elsewhere classified: Secondary | ICD-10-CM

## 2016-12-25 DIAGNOSIS — M6281 Muscle weakness (generalized): Secondary | ICD-10-CM | POA: Diagnosis not present

## 2016-12-25 DIAGNOSIS — M25572 Pain in left ankle and joints of left foot: Secondary | ICD-10-CM | POA: Diagnosis not present

## 2016-12-25 NOTE — Therapy (Signed)
[place] MAIN [facility] SERVICES [address] [place], [place], [address] Phone: [phone]   Fax:  [phone]  Physical Therapy Treatment  Patient Details  Name: Kelsey Randolph MRN: [id number] Date of Birth: [birth year] Referring Provider: Samara Deist MD  Encounter Date: 12/25/2016      PT End of Session - 12/25/16 0900    Visit Number 6   Number of Visits 12   Date for PT Re-Evaluation 01/18/17   PT Start Time 0830   PT Stop Time 0915   PT Time Calculation (min) 45 min   Activity Tolerance Patient tolerated treatment well   Behavior During Therapy [facility] for tasks assessed/performed      Past Medical History:  Diagnosis Date  . Allergy   . Anxiety   . Depression   . GERD (gastroesophageal reflux disease)     Past Surgical History:  Procedure Laterality Date  . FOOT SURGERY Left   . kidney stones removed  2004  . tonsillectomy and adenoidectomy  1993    There were no vitals filed for this visit.      Subjective Assessment - 12/25/16 0852    Subjective Patient reports increased soreness after the previous treatment states she put ice on the ankle to decrease the pain. Patient reports pain increased to 4/10.   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 14min   Diagnostic tests X-Ray   Patient Stated Goals Walk without pain   Currently in Pain? Yes   Pain Score 2    Pain Location Ankle   Pain Orientation Left   Pain Descriptors / Indicators Burning;Sore   Pain Onset More than a month ago         TREATMENT       Manual therapy: STM to the tibialis posterior on the L LE to decrease increased pain and spasms. Active release techniques to the area to decrease pain, spasms, and decrease trigger points with patient positioned in sitting.   Therapeutic Exercise: Arch lifts in sitting - x 2 min Arch lifts in standing - x 2 min Leg  Press - 2 x 20 225# BAPS board level 2 - 1.5 min each direction cw/ccw, laterally, and forward/backward Hip abduction with GTB - 2x20 B       PT Education - 12/25/16 0858    Education provided Yes   Education Details HEP: treadmill walking    Person(s) Educated Patient   Methods Explanation;Demonstration   Comprehension Verbalized understanding;Returned demonstration             PT Long Term Goals - 12/07/16 9211      PT LONG TERM GOAL #1   Title Patient will be independent with HEP focused on improving ankle function to continue benefits of therapy after discharge.   Baseline dependent with form/technique   Time 6   Period Weeks   Status New     PT LONG TERM GOAL #2   Title Patient will be able to perform boot camp exercise program without pain to demonstrate significant improvement in ankle function.   Baseline unable   Time 6   Period Weeks   Status New     PT LONG TERM GOAL #3   Title Patient will improve LEFS to 64/80 to demonstrate significant improvement in foot/ankle function and greater ability to walk without pain.    Baseline 40/80   Time 6   Period  Weeks   Status New               Plan - 12/25/16 0932    Clinical Impression Statement Focused on performing hip stabilization/strengthening and improve motor control of the ankle musculature exercises secondary to patient's increase in ankle soreness from yesterday's session. Patient demonstrates increased hip muscle (abductors) fatigue (more of R than left) after performing exercises indicating decreased muscular endurance; patient will benefit from further skilled therapy focused on improving limitations to return to prior level of function.    Rehab Potential Good   Clinical Impairments Affecting Rehab Potential (+) highly motivated (-) pain aggrvation with weight bearing on LEs   PT Frequency 2x / week   PT Duration 6 weeks   PT Treatment/Interventions Cryotherapy;Electrical  Stimulation;Iontophoresis 4mg /ml Dexamethasone;Moist Heat;Ultrasound;Therapeutic exercise;Therapeutic activities;Gait training;Balance training;Neuromuscular re-education;Patient/family education;Manual techniques;Dry needling;Passive range of motion   PT Next Visit Plan Progress strengthening on the L LE and hip; coordination of foot/ankle musculature   PT Home Exercise Plan isometrics plantarflexion/ inversion   Consulted and Agree with Plan of Care Patient      Patient will benefit from skilled therapeutic intervention in order to improve the following deficits and impairments:  Abnormal gait, Increased fascial restricitons, Pain, Decreased coordination, Decreased mobility, Decreased endurance, Decreased range of motion, Decreased strength, Difficulty walking  Visit Diagnosis: Stiffness of left ankle, not elsewhere classified  Pain in left ankle and joints of left foot  Muscle weakness (generalized)     Problem List Patient Active Problem List   Diagnosis Date Noted  . Abnormal blood chemistry 01/28/2015  . Allergic rhinitis 01/28/2015  . Anxiety 01/28/2015  . Chest pain 01/28/2015  . Family planning 01/28/2015  . Acid reflux 01/28/2015  . LBP (low back pain) 01/28/2015  . Plantar fasciitis 01/28/2015  . Borderline diabetes 01/28/2015  . Psoriasis 01/28/2015  . Avitaminosis D 01/28/2015  . Disease of white blood cells 11/22/2009  . Awareness of heartbeats 03/23/2008  . Adult hypothyroidism 03/14/2008  . History of tobacco use 11/30/2007  . Calculus of kidney 11/23/2007  . 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 Clinical depression 03/04/2001  . Extreme obesity 08/10/1998    Blythe Stanford, PT DPT 12/25/2016, 9:35 AM  [place] MAIN [facility] SERVICES [address] [place], [place], [address] Phone: [phone]   Fax:  [phone]  Name: PAIJE GOODHART MRN: [id number] Date of Birth: 09-12-1975

## 2016-12-28 MED FILL — HUMIRA PEN 40 MG/0.8ML PNKT: 40 | 28 days supply | Qty: 2 | Fill #0

## 2016-12-29 ENCOUNTER — Ambulatory Visit: Payer: 59

## 2016-12-29 DIAGNOSIS — M6281 Muscle weakness (generalized): Secondary | ICD-10-CM

## 2016-12-29 DIAGNOSIS — M25672 Stiffness of left ankle, not elsewhere classified: Secondary | ICD-10-CM | POA: Diagnosis not present

## 2016-12-29 DIAGNOSIS — M25572 Pain in left ankle and joints of left foot: Secondary | ICD-10-CM | POA: Diagnosis not present

## 2016-12-29 NOTE — Therapy (Signed)
[place] MAIN [facility] SERVICES [address] [place], [place], [address] Phone: [phone]   Fax:  [phone]  Physical Therapy Treatment  Patient Details  Name: Kelsey Randolph MRN: [id number] Date of Birth: 02/19/1976 Referring Provider: Samara Deist MD  Encounter Date: 12/29/2016      PT End of Session - 12/29/16 0839    Visit Number 7   Number of Visits 12   Date for PT Re-Evaluation 01/18/17   PT Start Time 0800   PT Stop Time 0845   PT Time Calculation (min) 45 min   Activity Tolerance Patient tolerated treatment well   Behavior During Therapy [facility] for tasks assessed/performed      Past Medical History:  Diagnosis Date  . Allergy   . Anxiety   . Depression   . GERD (gastroesophageal reflux disease)     Past Surgical History:  Procedure Laterality Date  . FOOT SURGERY Left   . kidney stones removed  2004  . tonsillectomy and adenoidectomy  1993    There were no vitals filed for this visit.      Subjective Assessment - 12/29/16 0820    Subjective Patient reports increased soreness today; but reports no pain from Friday - Saturday and states it is the first time she hasn't had pain since the onset of injury.    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 68min   Diagnostic tests X-Ray   Patient Stated Goals Walk without pain   Currently in Pain? Yes   Pain Score 3    Pain Location Ankle   Pain Orientation Left   Pain Descriptors / Indicators Sore;Burning   Pain Type Acute pain   Pain Onset More than a month ago   Pain Frequency Constant      TREATMENT       Manual therapy: STM to the tibialis posterior on the L LE to decrease increased pain and spasms. Active release techniques to the area to decrease pain, spasms, and decrease trigger points with patient positioned in sitting.   Therapeutic Exercise: Treadmill - 14min  with cueing on stride; 29mph without use of UE support Heel to toe walking on airex beam - x8 Dynadisc  - 1.5 min each direction cw/ccw, laterally, and forward/backward Arch lifts in sitting - x 2 min Arch lifts in standing - x 2 min Heel lifts with UE support - x40 Patient demonstrates abolishment of pain after performing manual therapy.         PT Education - 12/29/16 0836    Education provided Yes   Education Details Reinforced HEP: treadmill walking and resisted inversion at home   Person(s) Educated Patient   Methods Explanation;Demonstration   Comprehension Verbalized understanding;Returned demonstration             PT Long Term Goals - 12/07/16 3614      PT LONG TERM GOAL #1   Title Patient will be independent with HEP focused on improving ankle function to continue benefits of therapy after discharge.   Baseline dependent with form/technique   Time 6   Period Weeks   Status New     PT LONG TERM GOAL #2   Title Patient will be able to perform boot camp exercise program without pain to demonstrate significant improvement in ankle function.   Baseline unable   Time 6   Period Weeks   Status New  PT LONG TERM GOAL #3   Title Patient will improve LEFS to 64/80 to demonstrate significant improvement in foot/ankle function and greater ability to walk without pain.    Baseline 40/80   Time 6   Period Weeks   Status New               Plan - 12/29/16 7262    Clinical Impression Statement Patient demonstrates abolishment of pain after performing manual therapy indicating decrease in muscle spasms/pain. Continued to focus on improving ankle stabilization in standing positions to improve ankle strength. Patient states no increase in pain throughout session and patient will benefit from further skilled therapy to return to prior level of function.    Rehab Potential Good   Clinical Impairments Affecting Rehab Potential (+) highly motivated (-) pain aggrvation  with weight bearing on LEs   PT Frequency 2x / week   PT Duration 6 weeks   PT Treatment/Interventions Cryotherapy;Electrical Stimulation;Iontophoresis 4mg /ml Dexamethasone;Moist Heat;Ultrasound;Therapeutic exercise;Therapeutic activities;Gait training;Balance training;Neuromuscular re-education;Patient/family education;Manual techniques;Dry needling;Passive range of motion   PT Next Visit Plan Progress strengthening on the L LE and hip; coordination of foot/ankle musculature   PT Home Exercise Plan isometrics plantarflexion/ inversion   Consulted and Agree with Plan of Care Patient      Patient will benefit from skilled therapeutic intervention in order to improve the following deficits and impairments:  Abnormal gait, Increased fascial restricitons, Pain, Decreased coordination, Decreased mobility, Decreased endurance, Decreased range of motion, Decreased strength, Difficulty walking  Visit Diagnosis: Stiffness of left ankle, not elsewhere classified  Pain in left ankle and joints of left foot  Muscle weakness (generalized)     Problem List Patient Active Problem List   Diagnosis Date Noted  . Abnormal blood chemistry 01/28/2015  . Allergic rhinitis 01/28/2015  . Anxiety 01/28/2015  . Chest pain 01/28/2015  . Family planning 01/28/2015  . Acid reflux 01/28/2015  . LBP (low back pain) 01/28/2015  . Plantar fasciitis 01/28/2015  . Borderline diabetes 01/28/2015  . Psoriasis 01/28/2015  . Avitaminosis D 01/28/2015  . Disease of white blood cells 11/22/2009  . Awareness of heartbeats 03/23/2008  . Adult hypothyroidism 03/14/2008  . History of tobacco use 11/30/2007  . Calculus of kidney 11/23/2007  . 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 Clinical depression 03/04/2001  . Extreme obesity 08/10/1998    Blythe Stanford, PT DPT 12/29/2016, 8:48 AM  [place] MAIN [facility] SERVICES [address] [place], [place], [address] Phone:  [phone]   Fax:  [phone]  Name: OMOLARA CAROL MRN: [id number] Date of Birth: 06/26/1976

## 2017-01-01 ENCOUNTER — Ambulatory Visit: Payer: 59

## 2017-01-01 ENCOUNTER — Ambulatory Visit: Admit: 2017-01-01 | Payer: 59 | Admitting: Podiatry

## 2017-01-01 DIAGNOSIS — M6281 Muscle weakness (generalized): Secondary | ICD-10-CM

## 2017-01-01 DIAGNOSIS — M25572 Pain in left ankle and joints of left foot: Secondary | ICD-10-CM

## 2017-01-01 DIAGNOSIS — M25672 Stiffness of left ankle, not elsewhere classified: Secondary | ICD-10-CM

## 2017-01-01 SURGERY — TENDON REPAIR
Anesthesia: Choice | Laterality: Left

## 2017-01-01 SURGERY — Surgical Case
Anesthesia: *Unknown

## 2017-01-01 NOTE — Therapy (Signed)
[facility] [address] [place], [address] Phone: [phone]   Fax:  [phone]  Physical Therapy Treatment  Patient Details  Name: Kelsey Randolph MRN: [id number] Date of Birth: 09-Nov-1975 Referring Provider: Samara Deist MD  Encounter Date: 01/01/2017      PT End of Session - 01/01/17 0833    Visit Number 8   Number of Visits 12   Date for PT Re-Evaluation 01/18/17   PT Start Time 0800   PT Stop Time 0845   PT Time Calculation (min) 45 min   Activity Tolerance Patient tolerated treatment well   Behavior During Therapy [facility] for tasks assessed/performed      Past Medical History:  Diagnosis Date  . Allergy   . Anxiety   . Depression   . GERD (gastroesophageal reflux disease)     Past Surgical History:  Procedure Laterality Date  . FOOT SURGERY Left   . kidney stones removed  2004  . tonsillectomy and adenoidectomy  1993    There were no vitals filed for this visit.      Subjective Assessment - 01/01/17 0830    Subjective Patient reports increased soreness today; but reports no pain from Friday - Saturday and states it is the first time she hasn't had pain since the onset of injury.    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 25min   Diagnostic tests X-Ray   Patient Stated Goals Walk without pain   Currently in Pain? Yes   Pain Score 1    Pain Location Ankle   Pain Orientation Left   Pain Descriptors / Indicators Aching   Pain Type Acute pain   Pain Onset More than a month ago   Pain Frequency Constant        TREATMENT     Manual therapy: STM to the tibialis posterior on the L LE to decrease increased pain and spasms. Active release techniques to the area to decrease pain, spasms, and decrease trigger points with patient positioned in sitting.   Therapeutic Exercise: Tandem stance on  airex pad - 2 x 45sec B Widened tandem stance ant/post weight shifts - x 20 CKC dorsiflexion against the wall - x20  Hip Machine for hip abduction - 2 x 20 40# Feet together balance - x20 on airex tosses at [employer]       PT Education - 01/01/17 0831    Education provided Yes   Education Details Reinforced HEP: knee to wall CKC   Person(s) Educated Patient   Methods Explanation;Demonstration   Comprehension Verbalized understanding;Returned demonstration             PT Long Term Goals - 12/07/16 1751      PT LONG TERM GOAL #1   Title Patient will be independent with HEP focused on improving ankle function to continue benefits of therapy after discharge.   Baseline dependent with form/technique   Time 6   Period Weeks   Status New     PT LONG TERM GOAL #2   Title Patient will be able to perform boot camp exercise program without pain to demonstrate significant improvement in ankle function.   Baseline unable   Time 6   Period Weeks   Status New     PT LONG TERM GOAL #3   Title Patient will improve LEFS to 64/80 to demonstrate significant improvement in foot/ankle  function and greater ability to walk without pain.    Baseline 40/80   Time 6   Period Weeks   Status New               Plan - 01/01/17 2707    Clinical Impression Statement Patient demonstrates increased soreness along the gastrocs today and focused on improving hip strengthening and improving dorsiflexion to decrease risk of further injury. Patient demonstrates fatigue at end of session indicating decreased muscular endurance and patient will benefit from further skilled therapy to return to prior level of function.    Rehab Potential Good   Clinical Impairments Affecting Rehab Potential (+) highly motivated (-) pain aggrvation with weight bearing on LEs   PT Frequency 2x / week   PT Duration 6 weeks   PT Treatment/Interventions Cryotherapy;Electrical Stimulation;Iontophoresis 4mg /ml  Dexamethasone;Moist Heat;Ultrasound;Therapeutic exercise;Therapeutic activities;Gait training;Balance training;Neuromuscular re-education;Patient/family education;Manual techniques;Dry needling;Passive range of motion   PT Next Visit Plan Progress strengthening on the L LE and hip; coordination of foot/ankle musculature   PT Home Exercise Plan isometrics plantarflexion/ inversion   Consulted and Agree with Plan of Care Patient      Patient will benefit from skilled therapeutic intervention in order to improve the following deficits and impairments:  Abnormal gait, Increased fascial restricitons, Pain, Decreased coordination, Decreased mobility, Decreased endurance, Decreased range of motion, Decreased strength, Difficulty walking, Impaired UE functional use  Visit Diagnosis: Stiffness of left ankle, not elsewhere classified  Pain in left ankle and joints of left foot  Muscle weakness (generalized)     Problem List Patient Active Problem List   Diagnosis Date Noted  . Abnormal blood chemistry 01/28/2015  . Allergic rhinitis 01/28/2015  . Anxiety 01/28/2015  . Chest pain 01/28/2015  . Family planning 01/28/2015  . Acid reflux 01/28/2015  . LBP (low back pain) 01/28/2015  . Plantar fasciitis 01/28/2015  . Borderline diabetes 01/28/2015  . Psoriasis 01/28/2015  . Avitaminosis D 01/28/2015  . Disease of white blood cells 11/22/2009  . Awareness of heartbeats 03/23/2008  . Adult hypothyroidism 03/14/2008  . History of tobacco use 11/30/2007  . Calculus of kidney 11/23/2007  . 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 Clinical depression 03/04/2001  . Extreme obesity 08/10/1998    Blythe Stanford, PT DPT 01/01/2017, 8:43 AM  [facility] [address], [place], [address] Phone: [phone]   Fax:  [phone]  Name: Kelsey Randolph MRN: [id number] Date of Birth: 24-Sep-1975

## 2017-01-06 ENCOUNTER — Ambulatory Visit: Payer: 59

## 2017-01-06 DIAGNOSIS — M25572 Pain in left ankle and joints of left foot: Secondary | ICD-10-CM

## 2017-01-06 DIAGNOSIS — M25672 Stiffness of left ankle, not elsewhere classified: Secondary | ICD-10-CM

## 2017-01-06 DIAGNOSIS — M6281 Muscle weakness (generalized): Secondary | ICD-10-CM | POA: Diagnosis not present

## 2017-01-06 NOTE — Therapy (Signed)
[facility] [address], [place], [address] Phone: [phone]   Fax:  [phone]  Physical Therapy Treatment  Patient Details  Name: Kelsey Randolph MRN: [id number] Date of Birth: 08-22-1975 Referring Provider: Samara Deist MD  Encounter Date: 01/06/2017      PT End of Session - 01/06/17 1208    Visit Number 9   Number of Visits 12   Date for PT Re-Evaluation 01/18/17   PT Start Time 1130   PT Stop Time 1215   PT Time Calculation (min) 45 min   Activity Tolerance Patient tolerated treatment well   Behavior During Therapy [facility] for tasks assessed/performed      Past Medical History:  Diagnosis Date  . Allergy   . Anxiety   . Depression   . GERD (gastroesophageal reflux disease)     Past Surgical History:  Procedure Laterality Date  . FOOT SURGERY Left   . kidney stones removed  2004  . tonsillectomy and adenoidectomy  1993    There were no vitals filed for this visit.      Subjective Assessment - 01/06/17 1204    Subjective Patient reports increased soreness and pain along lateral ankle today secondary to working out today.    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 3min   Diagnostic tests X-Ray   Patient Stated Goals Walk without pain   Currently in Pain? Yes   Pain Score 4    Pain Location Ankle   Pain Orientation Left   Pain Descriptors / Indicators Aching   Pain Onset More than a month ago   Pain Frequency Constant       TREATMENT     Manual therapy: STM to the tibialis posterior on the L LE to decrease increased pain and spasms. Active release techniques to the area to decrease pain, spasms, and decrease trigger points with patient positioned in sitting.   Therapeutic Exercise: Toe Flexion with RTB - x30 in long sitting Heel raises with pinching a ball with heels - x 20  Hip  Machine with hip abduction - x20 #40 Marches on bosu - x 20  Side stepping up and over Bosu - x 20  Step with high knee up Bosu ball - x 20  Step ups onto Bosu ball with contralateral high knee x 10 B         PT Education - 01/06/17 1207    Education provided Yes   Education Details Reinforec HEP   Person(s) Educated Patient   Methods Explanation;Demonstration   Comprehension Verbalized understanding;Returned demonstration             PT Long Term Goals - 12/07/16 4270      PT LONG TERM GOAL #1   Title Patient will be independent with HEP focused on improving ankle function to continue benefits of therapy after discharge.   Baseline dependent with form/technique   Time 6   Period Weeks   Status New     PT LONG TERM GOAL #2   Title Patient will be able to perform boot camp exercise program without pain to demonstrate significant improvement in ankle function.   Baseline unable   Time 6   Period Weeks   Status New     PT LONG TERM GOAL #3   Title Patient will improve LEFS to 64/80 to demonstrate significant improvement in foot/ankle function  and greater ability to walk without pain.    Baseline 40/80   Time 6   Period Weeks   Status New               Plan - 01/06/17 1210    Clinical Impression Statement Patient demonstrates improvement in symptoms after performing manual therapy STM indicating decreased pain and spasms. Patient demonstrates increased pain along arch of the foot and patient patient toe flexion exercise for HEP to improve strength in deep flexors of the foot. Patient will benefit from further skilled therapy focused on improving ankle stabilization to return to prior level of function.    Rehab Potential Good   Clinical Impairments Affecting Rehab Potential (+) highly motivated (-) pain aggrvation with weight bearing on LEs   PT Frequency 2x / week   PT Duration 6 weeks   PT Treatment/Interventions Cryotherapy;Electrical  Stimulation;Iontophoresis 4mg /ml Dexamethasone;Moist Heat;Ultrasound;Therapeutic exercise;Therapeutic activities;Gait training;Balance training;Neuromuscular re-education;Patient/family education;Manual techniques;Dry needling;Passive range of motion   PT Next Visit Plan Progress strengthening on the L LE and hip; coordination of foot/ankle musculature   PT Home Exercise Plan isometrics plantarflexion/ inversion   Consulted and Agree with Plan of Care Patient      Patient will benefit from skilled therapeutic intervention in order to improve the following deficits and impairments:  Abnormal gait, Increased fascial restricitons, Pain, Decreased coordination, Decreased mobility, Decreased endurance, Decreased range of motion, Decreased strength, Difficulty walking, Impaired UE functional use  Visit Diagnosis: Stiffness of left ankle, not elsewhere classified  Pain in left ankle and joints of left foot  Muscle weakness (generalized)     Problem List Patient Active Problem List   Diagnosis Date Noted  . Abnormal blood chemistry 01/28/2015  . Allergic rhinitis 01/28/2015  . Anxiety 01/28/2015  . Chest pain 01/28/2015  . Family planning 01/28/2015  . Acid reflux 01/28/2015  . LBP (low back pain) 01/28/2015  . Plantar fasciitis 01/28/2015  . Borderline diabetes 01/28/2015  . Psoriasis 01/28/2015  . Avitaminosis D 01/28/2015  . Disease of white blood cells 11/22/2009  . Awareness of heartbeats 03/23/2008  . Adult hypothyroidism 03/14/2008  . History of tobacco use 11/30/2007  . Calculus of kidney 11/23/2007  . 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 Clinical depression 03/04/2001  . Extreme obesity 08/10/1998    Blythe Stanford, PT DPT 01/06/2017, 12:15 PM  [facility] [address] [place], [address] Phone: [phone]   Fax:  [phone]  Name: Kelsey Randolph MRN: [id number] Date of Birth:  11-27-1975

## 2017-01-07 ENCOUNTER — Ambulatory Visit: Payer: 59

## 2017-01-07 DIAGNOSIS — M25572 Pain in left ankle and joints of left foot: Secondary | ICD-10-CM | POA: Diagnosis not present

## 2017-01-07 DIAGNOSIS — M25672 Stiffness of left ankle, not elsewhere classified: Secondary | ICD-10-CM | POA: Diagnosis not present

## 2017-01-07 DIAGNOSIS — M6281 Muscle weakness (generalized): Secondary | ICD-10-CM

## 2017-01-07 NOTE — Therapy (Signed)
[facility] [address] [place], [address] Phone: [phone]   Fax:  [phone]  Physical Therapy Treatment  Patient Details  Name: Kelsey Randolph MRN: [id number] Date of Birth: 19-Jan-1976 Referring Provider: Samara Deist MD  Encounter Date: 01/07/2017      PT End of Session - 01/07/17 1416    Visit Number 10   Number of Visits 12   Date for PT Re-Evaluation 01/18/17   PT Start Time 1497   PT Stop Time 1430   PT Time Calculation (min) 45 min   Activity Tolerance Patient tolerated treatment well   Behavior During Therapy [facility] for tasks assessed/performed      Past Medical History:  Diagnosis Date  . Allergy   . Anxiety   . Depression   . GERD (gastroesophageal reflux disease)     Past Surgical History:  Procedure Laterality Date  . FOOT SURGERY Left   . kidney stones removed  2004  . tonsillectomy and adenoidectomy  1993    There were no vitals filed for this visit.      Subjective Assessment - 01/07/17 1409    Subjective Patient reports increased burning and pain in the ankle today. Patient states her ankle felt better until 7pm today.    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 49min   Diagnostic tests X-Ray   Patient Stated Goals Walk without pain   Currently in Pain? Yes   Pain Score 5    Pain Location Ankle   Pain Orientation Left   Pain Descriptors / Indicators Aching;Burning   Pain Type Acute pain   Pain Onset More than a month ago         Manual therapy: STM to the tibialis posterior on the L LE to decrease increased pain and spasms. Active release techniques to the area to decrease pain, spasms, and decrease trigger points with patient positioned in sitting.   Therapeutic Exercise: Calf stretch B in standing against step-4 x 20sec  Heel lifting eccentric lowering - 2 x 20   Alphabet with 5# weight - x 1 uppercase; x1 lowercase Sidelying ankle inversion with 5 # weight  against RTB - 2 x 25 CKC ankle dorsiflexion with knee to wall - 2 x 30 SLS on bosu black side up - 2x30sec Patient demonstrates increased muscular soreness at end of session         PT Education - 01/07/17 1415    Education provided Yes   Education Details form/technique with exercise   Person(s) Educated Patient   Methods Explanation;Demonstration   Comprehension Verbalized understanding;Returned demonstration             PT Long Term Goals - 12/07/16 0263      PT LONG TERM GOAL #1   Title Patient will be independent with HEP focused on improving ankle function to continue benefits of therapy after discharge.   Baseline dependent with form/technique   Time 6   Period Weeks   Status New     PT LONG TERM GOAL #2   Title Patient will be able to perform boot camp exercise program without pain to demonstrate significant improvement in ankle function.   Baseline unable   Time 6   Period Weeks   Status New     PT LONG TERM GOAL #3   Title Patient will improve LEFS to 64/80 to demonstrate  significant improvement in foot/ankle function and greater ability to walk without pain.    Baseline 40/80   Time 6   Period Weeks   Status New               Plan - 01/07/17 1418    Clinical Impression Statement Patient demonstrates improvement in symptoms after manual therapy. Focused on performing mobility improving exercises to dorsiflexion and decrease future chance of injury. Patient will benefit from further skilled therapy to return to prior level of function.    Rehab Potential Good   Clinical Impairments Affecting Rehab Potential (+) highly motivated (-) pain aggrvation with weight bearing on LEs   PT Frequency 2x / week   PT Duration 6 weeks   PT Treatment/Interventions Cryotherapy;Electrical Stimulation;Iontophoresis 4mg /ml Dexamethasone;Moist  Heat;Ultrasound;Therapeutic exercise;Therapeutic activities;Gait training;Balance training;Neuromuscular re-education;Patient/family education;Manual techniques;Dry needling;Passive range of motion   PT Next Visit Plan Progress strengthening on the L LE and hip; coordination of foot/ankle musculature   PT Home Exercise Plan isometrics plantarflexion/ inversion   Consulted and Agree with Plan of Care Patient      Patient will benefit from skilled therapeutic intervention in order to improve the following deficits and impairments:  Abnormal gait, Increased fascial restricitons, Pain, Decreased coordination, Decreased mobility, Decreased endurance, Decreased range of motion, Decreased strength, Difficulty walking, Impaired UE functional use  Visit Diagnosis: Stiffness of left ankle, not elsewhere classified  Pain in left ankle and joints of left foot  Muscle weakness (generalized)     Problem List Patient Active Problem List   Diagnosis Date Noted  . Abnormal blood chemistry 01/28/2015  . Allergic rhinitis 01/28/2015  . Anxiety 01/28/2015  . Chest pain 01/28/2015  . Family planning 01/28/2015  . Acid reflux 01/28/2015  . LBP (low back pain) 01/28/2015  . Plantar fasciitis 01/28/2015  . Borderline diabetes 01/28/2015  . Psoriasis 01/28/2015  . Avitaminosis D 01/28/2015  . Disease of white blood cells 11/22/2009  . Awareness of heartbeats 03/23/2008  . Adult hypothyroidism 03/14/2008  . History of tobacco use 11/30/2007  . Calculus of kidney 11/23/2007  . 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 Clinical depression 03/04/2001  . Extreme obesity 08/10/1998    Blythe Stanford, PT DPT 01/07/2017, 2:52 PM  [facility] [address], [place], [address] Phone: [phone]   Fax:  [phone]  Name: Kelsey Randolph MRN: [id number] Date of Birth: 22-Feb-1976

## 2017-01-11 ENCOUNTER — Ambulatory Visit: Payer: 59 | Attending: Podiatry

## 2017-01-11 DIAGNOSIS — M25672 Stiffness of left ankle, not elsewhere classified: Secondary | ICD-10-CM | POA: Insufficient documentation

## 2017-01-11 DIAGNOSIS — M6281 Muscle weakness (generalized): Secondary | ICD-10-CM | POA: Diagnosis not present

## 2017-01-11 DIAGNOSIS — M25572 Pain in left ankle and joints of left foot: Secondary | ICD-10-CM | POA: Diagnosis not present

## 2017-01-12 NOTE — Therapy (Signed)
[facility] [address] [place], [address] Phone: [phone]   Fax:  [phone]  Physical Therapy Treatment  Patient Details  Name: Kelsey Randolph MRN: [id number] Date of Birth: [birth year] Referring Provider: Samara Deist MD  Encounter Date: 01/11/2017      PT End of Session - 01/11/17 1525    Visit Number 11   Number of Visits 12   Date for PT Re-Evaluation 01/18/17   PT Start Time 1430   PT Stop Time 1515   PT Time Calculation (min) 45 min   Activity Tolerance Patient tolerated treatment well   Behavior During Therapy [facility] for tasks assessed/performed      Past Medical History:  Diagnosis Date  . Allergy   . Anxiety   . Depression   . GERD (gastroesophageal reflux disease)     Past Surgical History:  Procedure Laterality Date  . FOOT SURGERY Left   . kidney stones removed  2004  . tonsillectomy and adenoidectomy  1993    There were no vitals filed for this visit.      Subjective Assessment - 01/11/17 1523    Subjective Patient reports pain decreased for 2 days after the previous treatment and states this past Saturday symptoms began to increase. Patient reports she would like to have dry needling performed to decrease leg pain.    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 57min   Diagnostic tests X-Ray   Patient Stated Goals Walk without pain   Currently in Pain? Yes   Pain Score 4    Pain Location Ankle   Pain Orientation Left   Pain Descriptors / Indicators Aching;Burning   Pain Type Acute pain   Pain Onset More than a month ago       TREATMENT:   Manual Therapy: STM over the proximal, distal and muscle belly of the tibialis posterior and flexor digitorum longus to decreased increased pain and spasms with the patient positioned in long sitting.   Dry Needling: (4 min unbilled) Two  121mm x .4 mm needles placed into the lower leg to  Directed towards the flexor digitorum longus and tibialis posterior muscle belly to decrease muscle spasms and dysfunction.  Therapeutic Exercise: Patient in sitting: Ankle inversion performed with 7.5# weight and 10# weight -- x 30 each set respectively. Patient demonstrates increased fatigue after performance              PT Education - 01/11/17 1525    Education provided Yes   Education Details form/technique with exercise   Person(s) Educated Patient   Methods Explanation;Demonstration   Comprehension Verbalized understanding;Returned demonstration             PT Long Term Goals - 12/07/16 1829      PT LONG TERM GOAL #1   Title Patient will be independent with HEP focused on improving ankle function to continue benefits of therapy after discharge.   Baseline dependent with form/technique   Time 6   Period Weeks   Status New     PT LONG TERM GOAL #2   Title Patient will be able to perform boot camp exercise program without pain to demonstrate significant improvement in ankle function.   Baseline unable   Time 6   Period Weeks   Status New     PT LONG TERM GOAL #3   Title Patient will improve  LEFS to 64/80 to demonstrate significant improvement in foot/ankle function and greater ability to walk without pain.    Baseline 40/80   Time 6   Period Weeks   Status New               Plan - 01/11/17 1908    Clinical Impression Statement Patient demonstrates significant improvement in ankle pain with decreased "burning" pain after performing manual therapy and dry needling. Patient verbally agrees to dry needling intervention and was educated on potential risks and benefits. Focused on performing loading to the involved tissue after performing manual therapy and patient will benefit from further skilled therapy focused on improving muscluar strength and coordination of the affected Lower leg musculature to  return to prior level of function.     Rehab Potential Good   Clinical Impairments Affecting Rehab Potential (+) highly motivated (-) pain aggrvation with weight bearing on LEs   PT Frequency 2x / week   PT Duration 6 weeks   PT Treatment/Interventions Cryotherapy;Electrical Stimulation;Iontophoresis 4mg /ml Dexamethasone;Moist Heat;Ultrasound;Therapeutic exercise;Therapeutic activities;Gait training;Balance training;Neuromuscular re-education;Patient/family education;Manual techniques;Dry needling;Passive range of motion   PT Next Visit Plan Progress strengthening on the L LE and hip; coordination of foot/ankle musculature   PT Home Exercise Plan isometrics plantarflexion/ inversion   Consulted and Agree with Plan of Care Patient      Patient will benefit from skilled therapeutic intervention in order to improve the following deficits and impairments:  Abnormal gait, Increased fascial restricitons, Pain, Decreased coordination, Decreased mobility, Decreased endurance, Decreased range of motion, Decreased strength, Difficulty walking, Impaired UE functional use  Visit Diagnosis: Stiffness of left ankle, not elsewhere classified  Pain in left ankle and joints of left foot     Problem List Patient Active Problem List   Diagnosis Date Noted  . Abnormal blood chemistry 01/28/2015  . Allergic rhinitis 01/28/2015  . Anxiety 01/28/2015  . Chest pain 01/28/2015  . Family planning 01/28/2015  . Acid reflux 01/28/2015  . LBP (low back pain) 01/28/2015  . Plantar fasciitis 01/28/2015  . Borderline diabetes 01/28/2015  . Psoriasis 01/28/2015  . Avitaminosis D 01/28/2015  . Disease of white blood cells 11/22/2009  . Awareness of heartbeats 03/23/2008  . Adult hypothyroidism 03/14/2008  . History of tobacco use 11/30/2007  . Calculus of kidney 11/23/2007  . 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 Clinical depression 03/04/2001  . Extreme obesity 08/10/1998    Blythe Stanford, PT  DPT 01/12/2017, 9:15 AM  [facility] [address], [place], [address] Phone: [phone]   Fax:  [phone]  Name: Kelsey Randolph MRN: [id number] Date of Birth: 01-20-1976

## 2017-01-13 ENCOUNTER — Ambulatory Visit: Payer: 59

## 2017-01-13 DIAGNOSIS — M25572 Pain in left ankle and joints of left foot: Secondary | ICD-10-CM

## 2017-01-13 DIAGNOSIS — M6281 Muscle weakness (generalized): Secondary | ICD-10-CM

## 2017-01-13 DIAGNOSIS — M25672 Stiffness of left ankle, not elsewhere classified: Secondary | ICD-10-CM | POA: Diagnosis not present

## 2017-01-13 NOTE — Therapy (Signed)
[facility] [address] [place], [address] Phone: [phone]   Fax:  [phone]  Physical Therapy Treatment  Patient Details  Name: Kelsey Randolph MRN: [id number] Date of Birth: 09/22/1975 Referring Provider: Samara Deist MD  Encounter Date: 01/13/2017      PT End of Session - 01/13/17 1116    Visit Number 12   Number of Visits 12   Date for PT Re-Evaluation 01/18/17   PT Start Time 1030   PT Stop Time 1116   PT Time Calculation (min) 46 min   Activity Tolerance Patient tolerated treatment well   Behavior During Therapy [facility] for tasks assessed/performed      Past Medical History:  Diagnosis Date  . Allergy   . Anxiety   . Depression   . GERD (gastroesophageal reflux disease)     Past Surgical History:  Procedure Laterality Date  . FOOT SURGERY Left   . kidney stones removed  2004  . tonsillectomy and adenoidectomy  1993    There were no vitals filed for this visit.      Subjective Assessment - 01/13/17 1115    Subjective Patient reports pain has continued to feel decreased since the previous treatment. Patient states she does not want to the pain to return and would like to try ultrasound treatment to help her issue.    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 10min   Diagnostic tests X-Ray   Patient Stated Goals Walk without pain   Currently in Pain? Yes   Pain Score 2    Pain Location Ankle   Pain Orientation Left   Pain Descriptors / Indicators Aching   Pain Type Acute pain   Pain Onset More than a month ago         Manual Therapy: STM with edge tool over the proximal, distal and muscle belly of the tibialis posterior and flexor digitorum longus to decreased increased pain and spasms with the patient positioned in long sitting.   Dry Needling: (4 min unbilled) Three 182mm x .4 mm  needles placed into the lower leg to Directed towards the flexor digitorum longus and tibialis posterior muscle belly to decrease muscle spasms and dysfunction. (Patient given verbal consent before performing)   Therapeutic Exercise: Patient in sitting: Ankle inversion performed with 7.5# weight  -- x 30 each set respectively.  Heel raises in standing -- x 10  Patient demonstrates increased fatigue after performance.    Ultrasound: To the tibialis posterior muscle belly and distal tendon, intensity: 2.0 Watts with small sound head, with 1 W/cm2 to decrease pain and spasms along the muscle belly/tendon with patient positioned in sittingfor 8 min  Ice Massage:(3 min unbilled) Ice Massage to the lower leg to decrease increased pain and spasms. Patient demonstrates no adverse skin reactions to ice after performance                       PT Education - 01/13/17 1116    Education provided Yes   Education Details Reinforced HEP   Person(s) Educated Patient   Methods Explanation;Demonstration   Comprehension Verbalized understanding;Returned demonstration             PT Long Term Goals - 12/07/16 9767      PT LONG TERM GOAL #1   Title Patient will be independent with HEP focused on improving  ankle function to continue benefits of therapy after discharge.   Baseline dependent with form/technique   Time 6   Period Weeks   Status New     PT LONG TERM GOAL #2   Title Patient will be able to perform boot camp exercise program without pain to demonstrate significant improvement in ankle function.   Baseline unable   Time 6   Period Weeks   Status New     PT LONG TERM GOAL #3   Title Patient will improve LEFS to 64/80 to demonstrate significant improvement in foot/ankle function and greater ability to walk without pain.    Baseline 40/80   Time 6   Period Weeks   Status New               Plan - 01/13/17 1117    Clinical Impression Statement  Patient reports abolishment of pain after instrument assisted manual therapy and dry needling reports no pain at baseline. Patient reports a light "pulling with performance of heel raise on the affected side but reports no pain with performance. Patient demonstrates a light pull with performance of ankle inversion exercise but states pain decreased after performing greater amount of exercises. Patient will benefit from further skilled therapy focused on improving ankle stabilization and use of maual therapy to decrease pain to return to prior level of function.    Rehab Potential Good   Clinical Impairments Affecting Rehab Potential (+) highly motivated (-) pain aggrvation with weight bearing on LEs   PT Frequency 2x / week   PT Duration 6 weeks   PT Treatment/Interventions Cryotherapy;Electrical Stimulation;Iontophoresis 4mg /ml Dexamethasone;Moist Heat;Ultrasound;Therapeutic exercise;Therapeutic activities;Gait training;Balance training;Neuromuscular re-education;Patient/family education;Manual techniques;Dry needling;Passive range of motion   PT Next Visit Plan Progress strengthening on the L LE and hip; coordination of foot/ankle musculature   PT Home Exercise Plan isometrics plantarflexion/ inversion   Consulted and Agree with Plan of Care Patient      Patient will benefit from skilled therapeutic intervention in order to improve the following deficits and impairments:  Abnormal gait, Increased fascial restricitons, Pain, Decreased coordination, Decreased mobility, Decreased endurance, Decreased range of motion, Decreased strength, Difficulty walking, Impaired UE functional use  Visit Diagnosis: Stiffness of left ankle, not elsewhere classified  Pain in left ankle and joints of left foot  Muscle weakness (generalized)     Problem List Patient Active Problem List   Diagnosis Date Noted  . Abnormal blood chemistry 01/28/2015  . Allergic rhinitis 01/28/2015  . Anxiety 01/28/2015  .  Chest pain 01/28/2015  . Family planning 01/28/2015  . Acid reflux 01/28/2015  . LBP (low back pain) 01/28/2015  . Plantar fasciitis 01/28/2015  . Borderline diabetes 01/28/2015  . Psoriasis 01/28/2015  . Avitaminosis D 01/28/2015  . Disease of white blood cells 11/22/2009  . Awareness of heartbeats 03/23/2008  . Adult hypothyroidism 03/14/2008  . History of tobacco use 11/30/2007  . Calculus of kidney 11/23/2007  . 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 Clinical depression 03/04/2001  . Extreme obesity 08/10/1998    Blythe Stanford, PT DPT 01/13/2017, 12:55 PM  [facility] [address] [place], [address] Phone: [phone]   Fax:  [phone]  Name: Kelsey Randolph MRN: [id number] Date of Birth: 1976-08-10

## 2017-01-14 ENCOUNTER — Ambulatory Visit: Payer: 59

## 2017-01-18 ENCOUNTER — Ambulatory Visit: Payer: 59

## 2017-01-18 DIAGNOSIS — M25672 Stiffness of left ankle, not elsewhere classified: Secondary | ICD-10-CM

## 2017-01-18 DIAGNOSIS — M6281 Muscle weakness (generalized): Secondary | ICD-10-CM

## 2017-01-18 DIAGNOSIS — M25572 Pain in left ankle and joints of left foot: Secondary | ICD-10-CM

## 2017-01-18 NOTE — Therapy (Signed)
[facility] [address], [place], [address] Phone: [phone]   Fax:  [phone]  Physical Therapy Treatment  Patient Details  Name: Kelsey Randolph MRN: [id number] Date of Birth: 02/05/1976 Referring Provider: Samara Deist MD  Encounter Date: 01/18/2017      PT End of Session - 01/18/17 1413    Visit Number 13   Number of Visits 21   Date for PT Re-Evaluation 02/15/17   PT Start Time 1950   PT Stop Time 1430   PT Time Calculation (min) 45 min   Activity Tolerance Patient tolerated treatment well   Behavior During Therapy [facility] for tasks assessed/performed      Past Medical History:  Diagnosis Date  . Allergy   . Anxiety   . Depression   . GERD (gastroesophageal reflux disease)     Past Surgical History:  Procedure Laterality Date  . FOOT SURGERY Left   . kidney stones removed  2004  . tonsillectomy and adenoidectomy  1993    There were no vitals filed for this visit.      Subjective Assessment - 01/18/17 1417    Subjective Patient reports decreased pain today but had slight stinging pain over the weekend. Patient reports her ankle is improving overall and is feeling good.    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 41min   Diagnostic tests X-Ray   Patient Stated Goals Walk without pain   Currently in Pain? Yes   Pain Score 2    Pain Location Ankle   Pain Orientation Left   Pain Descriptors / Indicators Aching   Pain Type Acute pain   Pain Onset More than a month ago   Pain Frequency Constant      Manual Therapy: STM with edge tool over the proximal, distal and muscle belly of the tibialis posterior and flexor digitorum longus to decreased increased pain and spasms with the patient positioned in long sitting.    Dry Needling: (5 min unbilled) Three 1103mm x .4 mm needles placed into the  lower leg to directed towards the flexor digitorum longus and tibialis posterior muscle belly to decrease muscle spasms and dysfunction. (Patient given verbal consent before performing)    Therapeutic Exercise: Patient in sitting: Ankle inversion performed with 7.5# weight  -- x 30 each set respectively.  Heel raises in standing on airex pad - x 10 Dynadisc forward/backward, laterally, circles (cw/ccw) - x23min Side stepping onto Bosu - x20  Marches on bosu - x 30 with intermittent UE support  Patient demonstrates increased fatigue after performance. LEFS: 45/80       PT Education - 01/18/17 1420    Education provided Yes   Education Details Form/technique with exercise   Person(s) Educated Patient   Methods Demonstration;Explanation   Comprehension Verbalized understanding;Returned demonstration             PT Long Term Goals - 01/18/17 1411      PT LONG TERM GOAL #1   Title Patient will be independent with HEP focused on improving ankle function to continue benefits of therapy after discharge.   Baseline dependent with form/technique; 01/18/17:Moderate cueing required for exercise technique   Time 6   Period Weeks   Status New     PT LONG TERM GOAL #2   Title Patient will be able to perform boot camp exercise program without pain to  demonstrate significant improvement in ankle function.   Baseline unable; 01/18/17: attempted with increased pain   Time 6   Period Weeks   Status New     PT LONG TERM GOAL #3   Title Patient will improve LEFS to 64/80 to demonstrate significant improvement in foot/ankle function and greater ability to walk without pain.    Baseline 40/80; 01/18/17: 45/80   Time 6   Period Weeks   Status New               Plan - 01/18/17 1417    Clinical Impression Statement Patient demonstrates significant improvement in pain and spasms at rest compared to the beginning of therapy and patient states she's had improved sleeping patterns since the  beginning of therapy. Patient demonstates improvements in LEFS by 5 points indicating improvement in ankle functioning. Patient continues to require the boot intermittently throughout the day to not ellicit increased pain at the end of the day. Although patient is improving, she contiues to have resting pain at rest and patient will benefit from further skilled therapy to return to prior level of function.    Rehab Potential Good   Clinical Impairments Affecting Rehab Potential (+) highly motivated (-) pain aggrvation with weight bearing on LEs   PT Frequency 2x / week   PT Duration 6 weeks   PT Treatment/Interventions Cryotherapy;Electrical Stimulation;Iontophoresis 4mg /ml Dexamethasone;Moist Heat;Ultrasound;Therapeutic exercise;Therapeutic activities;Gait training;Balance training;Neuromuscular re-education;Patient/family education;Manual techniques;Dry needling;Passive range of motion   PT Next Visit Plan Progress strengthening on the L LE and hip; coordination of foot/ankle musculature   PT Home Exercise Plan isometrics plantarflexion/ inversion   Consulted and Agree with Plan of Care Patient      Patient will benefit from skilled therapeutic intervention in order to improve the following deficits and impairments:  Abnormal gait, Increased fascial restricitons, Pain, Decreased coordination, Decreased mobility, Decreased endurance, Decreased range of motion, Decreased strength, Difficulty walking, Impaired UE functional use  Visit Diagnosis: Stiffness of left ankle, not elsewhere classified - Plan: PT plan of care cert/re-cert  Pain in left ankle and joints of left foot - Plan: PT plan of care cert/re-cert  Muscle weakness (generalized) - Plan: PT plan of care cert/re-cert     Problem List Patient Active Problem List   Diagnosis Date Noted  . Abnormal blood chemistry 01/28/2015  . Allergic rhinitis 01/28/2015  . Anxiety 01/28/2015  . Chest pain 01/28/2015  . Family planning  01/28/2015  . Acid reflux 01/28/2015  . LBP (low back pain) 01/28/2015  . Plantar fasciitis 01/28/2015  . Borderline diabetes 01/28/2015  . Psoriasis 01/28/2015  . Avitaminosis D 01/28/2015  . Disease of white blood cells 11/22/2009  . Awareness of heartbeats 03/23/2008  . Adult hypothyroidism 03/14/2008  . History of tobacco use 11/30/2007  . Calculus of kidney 11/23/2007  . 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 Clinical depression 03/04/2001  . Extreme obesity 08/10/1998    Blythe Stanford, PT DPT 01/18/2017, 3:42 PM  [facility] [address] [place], [address] Phone: [phone]   Fax:  [phone]  Name: Kelsey Randolph MRN: [id number] Date of Birth: 02/02/1976

## 2017-01-21 ENCOUNTER — Ambulatory Visit: Payer: 59

## 2017-01-21 DIAGNOSIS — M25572 Pain in left ankle and joints of left foot: Secondary | ICD-10-CM

## 2017-01-21 DIAGNOSIS — M6281 Muscle weakness (generalized): Secondary | ICD-10-CM | POA: Diagnosis not present

## 2017-01-21 DIAGNOSIS — M25672 Stiffness of left ankle, not elsewhere classified: Secondary | ICD-10-CM

## 2017-01-21 NOTE — Therapy (Signed)
[facility] [address], [place], [address] Phone: [phone]   Fax:  [phone]  Physical Therapy Treatment  Patient Details  Name: Kelsey Randolph MRN: [id number] Date of Birth: [birth year] Referring Provider: Samara Deist MD  Encounter Date: 01/21/2017      PT End of Session - 01/21/17 1645    Visit Number 14   Number of Visits 21   Date for PT Re-Evaluation 02/15/17   PT Start Time 1600   PT Stop Time 1645   PT Time Calculation (min) 45 min   Activity Tolerance Patient tolerated treatment well   Behavior During Therapy [facility] for tasks assessed/performed      Past Medical History:  Diagnosis Date  . Allergy   . Anxiety   . Depression   . GERD (gastroesophageal reflux disease)     Past Surgical History:  Procedure Laterality Date  . FOOT SURGERY Left   . kidney stones removed  2004  . tonsillectomy and adenoidectomy  1993    There were no vitals filed for this visit.      Subjective Assessment - 01/21/17 1631    Subjective Patient reports decreased pain today but had slight stinging pain over the weekend. Patient reports her ankle is improving overall and is feeling good.    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 12min   Diagnostic tests X-Ray   Patient Stated Goals Walk without pain   Currently in Pain? Yes   Pain Score 2    Pain Location Ankle   Pain Orientation Left   Pain Descriptors / Indicators Aching   Pain Type Acute pain   Pain Onset More than a month ago   Pain Frequency Constant        Manual Therapy: STM with edge tool over the proximal, distal tendon and muscle belly of the tibialis posterior and flexor digitorum longus to decreased increased pain and spasms with the patient positioned in long sitting.    Dry Needling: (4 min unbilled) Two 167mm x .4 mm needles placed into  the lower leg to  Directed towards the flexor digitorum longus and tibialis posterior muscle belly to decrease muscle spasms and dysfunction. (Patient given verbal consent before performing)    Therapeutic Exercise: Hip Machine Hip abduction - 2 x 20 40#  Arch ups in sitting - x 30 Ankle inversion in sitting at OMEGA - 2 x 25 10#    Ice Massage:(3 min unbilled) Ice Massage to the lower leg to decrease increased pain and spasms. Patient demonstrates no adverse skin reactions to ice after performance  Patient demonstrates decreased pain after performing manual therapy and dry needling        PT Education - 01/21/17 1632    Education provided Yes   Education Details form/technique with exercise   Person(s) Educated Patient   Methods Explanation;Demonstration   Comprehension Verbalized understanding;Returned demonstration             PT Long Term Goals - 01/18/17 1411      PT LONG TERM GOAL #1   Title Patient will be independent with HEP focused on improving ankle function to continue benefits of therapy after discharge.   Baseline dependent with form/technique; 01/18/17:Moderate cueing required for exercise technique   Time 6   Period Weeks   Status New     PT LONG TERM GOAL #2  Title Patient will be able to perform boot camp exercise program without pain to demonstrate significant improvement in ankle function.   Baseline unable; 01/18/17: attempted with increased pain   Time 6   Period Weeks   Status New     PT LONG TERM GOAL #3   Title Patient will improve LEFS to 64/80 to demonstrate significant improvement in foot/ankle function and greater ability to walk without pain.    Baseline 40/80; 01/18/17: 45/80   Time 6   Period Weeks   Status New               Plan - 01/21/17 1645    Clinical Impression Statement Patient demonstrates decreased pain after performing manual therapy and dry needling to the tibialis posterior indicating decreased pain and spasms.  Patient demonstrates increased fatigue along the affected musculature after performing exercises and will benefit from further skilled therapy to return to full activities.   Rehab Potential Good   Clinical Impairments Affecting Rehab Potential (+) highly motivated (-) pain aggrvation with weight bearing on LEs   PT Frequency 2x / week   PT Duration 6 weeks   PT Treatment/Interventions Cryotherapy;Electrical Stimulation;Iontophoresis 4mg /ml Dexamethasone;Moist Heat;Ultrasound;Therapeutic exercise;Therapeutic activities;Gait training;Balance training;Neuromuscular re-education;Patient/family education;Manual techniques;Dry needling;Passive range of motion   PT Next Visit Plan Progress strengthening on the L LE and hip; coordination of foot/ankle musculature   PT Home Exercise Plan isometrics plantarflexion/ inversion   Consulted and Agree with Plan of Care Patient      Patient will benefit from skilled therapeutic intervention in order to improve the following deficits and impairments:  Abnormal gait, Increased fascial restricitons, Pain, Decreased coordination, Decreased mobility, Decreased endurance, Decreased range of motion, Decreased strength, Difficulty walking, Impaired UE functional use  Visit Diagnosis: Stiffness of left ankle, not elsewhere classified  Pain in left ankle and joints of left foot  Muscle weakness (generalized)     Problem List Patient Active Problem List   Diagnosis Date Noted  . Abnormal blood chemistry 01/28/2015  . Allergic rhinitis 01/28/2015  . Anxiety 01/28/2015  . Chest pain 01/28/2015  . Family planning 01/28/2015  . Acid reflux 01/28/2015  . LBP (low back pain) 01/28/2015  . Plantar fasciitis 01/28/2015  . Borderline diabetes 01/28/2015  . Psoriasis 01/28/2015  . Avitaminosis D 01/28/2015  . Disease of white blood cells 11/22/2009  . Awareness of heartbeats 03/23/2008  . Adult hypothyroidism 03/14/2008  . History of tobacco use 11/30/2007  .  Calculus of kidney 11/23/2007  . 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 Clinical depression 03/04/2001  . Extreme obesity 08/10/1998    Blythe Stanford, PT DPT 01/21/2017, 4:47 PM  [place] [facility] [address] [place], [address] Phone: [phone]   Fax:  [phone]  Name: Kelsey Randolph MRN: [id number] Date of Birth: 1975/12/08

## 2017-01-22 MED FILL — HUMIRA PEN 40 MG/0.8ML PNKT: 40 | 28 days supply | Qty: 2 | Fill #1

## 2017-01-25 ENCOUNTER — Ambulatory Visit: Payer: 59

## 2017-01-25 DIAGNOSIS — M25672 Stiffness of left ankle, not elsewhere classified: Secondary | ICD-10-CM | POA: Diagnosis not present

## 2017-01-25 DIAGNOSIS — M6281 Muscle weakness (generalized): Secondary | ICD-10-CM

## 2017-01-25 DIAGNOSIS — M25572 Pain in left ankle and joints of left foot: Secondary | ICD-10-CM

## 2017-01-25 NOTE — Therapy (Signed)
[place] [facility] [address], [place], [address] Phone: [phone]   Fax:  [phone]  Physical Therapy Treatment  Patient Details  Name: Kelsey Randolph MRN: [id number] Date of Birth: 1975-12-29 Referring Provider: Samara Deist MD  Encounter Date: 01/25/2017      PT End of Session - 01/25/17 1417    Visit Number 15   Number of Visits 21   Date for PT Re-Evaluation 02/15/17   PT Start Time 5638   PT Stop Time 1430   PT Time Calculation (min) 45 min   Activity Tolerance Patient tolerated treatment well   Behavior During Therapy [facility] for tasks assessed/performed      Past Medical History:  Diagnosis Date  . Allergy   . Anxiety   . Depression   . GERD (gastroesophageal reflux disease)     Past Surgical History:  Procedure Laterality Date  . FOOT SURGERY Left   . kidney stones removed  2004  . tonsillectomy and adenoidectomy  1993    There were no vitals filed for this visit.      Subjective Assessment - 01/25/17 1415    Subjective Patient  reports decreased pain along her ankle over the weekend. Patient states she feels her ankle has "turned a corner".    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 75min   Diagnostic tests X-Ray   Patient Stated Goals Walk without pain   Currently in Pain? Yes   Pain Score 2    Pain Location Ankle   Pain Orientation Left   Pain Descriptors / Indicators Aching   Pain Type Acute pain   Pain Onset More than a month ago   Pain Frequency Constant         Manual Therapy: STM with edge tool over the proximal, distal tendon and muscle belly of the tibialis posterior and flexor digitorum longus to decreased increased pain and spasms with the patient positioned in long sitting.    Dry Needling: (4 min unbilled) Three 183mm x .4 mm needles placed into the lower leg to  directed towards the flexor digitorum longus and tibialis posterior muscle belly to decrease muscle spasms and dysfunction. (Patient given verbal consent before performing)    Therapeutic Exercise: Hip Machine Hip abduction - 2 x 20 55#  Ankle inversion in sitting at [place] - 2 x 25 10#  Single leg rotations on the L ankle - x 30 medial/lateral rotations    Patient demonstrates decreased pain after performing manual therapy and dry needling       PT Education - 01/25/17 1417    Education provided Yes   Education Details Form/technique with exercise   Person(s) Educated Patient   Methods Explanation;Demonstration   Comprehension Verbalized understanding;Returned demonstration             PT Long Term Goals - 01/18/17 1411      PT LONG TERM GOAL #1   Title Patient will be independent with HEP focused on improving ankle function to continue benefits of therapy after discharge.   Baseline dependent with form/technique; 01/18/17:Moderate cueing required for exercise technique   Time 6   Period Weeks   Status New     PT LONG TERM GOAL #2   Title Patient will be able to perform boot camp exercise program without pain to demonstrate significant improvement in ankle function.   Baseline unable; 01/18/17:  attempted with increased pain   Time 6   Period Weeks   Status New     PT LONG TERM GOAL #3   Title Patient will improve LEFS to 64/80 to demonstrate significant improvement in foot/ankle function and greater ability to walk without pain.    Baseline 40/80; 01/18/17: 45/80   Time 6   Period Weeks   Status New               Plan - 01/25/17 1418    Clinical Impression Statement Patient demonstrates decreased pain after performing manual therapy and dry needling indicating decreased pain and spasms along her ankle indicating decreased muscle spasms and improved neurological response. Patient demonstrates improved ankle function with ability to perform greater amount of  exercises without pain. Patient will benefit from further skilled therapy to return to prior level of function.     Rehab Potential Good   Clinical Impairments Affecting Rehab Potential (+) highly motivated (-) pain aggrvation with weight bearing on LEs   PT Frequency 2x / week   PT Duration 6 weeks   PT Treatment/Interventions Cryotherapy;Electrical Stimulation;Iontophoresis 4mg /ml Dexamethasone;Moist Heat;Ultrasound;Therapeutic exercise;Therapeutic activities;Gait training;Balance training;Neuromuscular re-education;Patient/family education;Manual techniques;Dry needling;Passive range of motion   PT Next Visit Plan Progress strengthening on the L LE and hip; coordination of foot/ankle musculature   PT Home Exercise Plan isometrics plantarflexion/ inversion   Consulted and Agree with Plan of Care Patient      Patient will benefit from skilled therapeutic intervention in order to improve the following deficits and impairments:  Abnormal gait, Increased fascial restricitons, Pain, Decreased coordination, Decreased mobility, Decreased endurance, Decreased range of motion, Decreased strength, Difficulty walking, Impaired UE functional use  Visit Diagnosis: Stiffness of left ankle, not elsewhere classified  Pain in left ankle and joints of left foot  Muscle weakness (generalized)     Problem List Patient Active Problem List   Diagnosis Date Noted  . Abnormal blood chemistry 01/28/2015  . Allergic rhinitis 01/28/2015  . Anxiety 01/28/2015  . Chest pain 01/28/2015  . Family planning 01/28/2015  . Acid reflux 01/28/2015  . LBP (low back pain) 01/28/2015  . Plantar fasciitis 01/28/2015  . Borderline diabetes 01/28/2015  . Psoriasis 01/28/2015  . Avitaminosis D 01/28/2015  . Disease of white blood cells 11/22/2009  . Awareness of heartbeats 03/23/2008  . Adult hypothyroidism 03/14/2008  . History of tobacco use 11/30/2007  . Calculus of kidney 11/23/2007  . Arthritis with psoriasis  ([place]) 12/03/2005  . Clinical depression 03/04/2001  . Extreme obesity 08/10/1998    Blythe Stanford, PT DPT 01/25/2017, 2:27 PM  [place] [facility] [address] [place], [address] Phone: [phone]   Fax:  [phone]  Name: Kelsey Randolph MRN: [id number] Date of Birth: Mar 16, 1976

## 2017-01-28 ENCOUNTER — Ambulatory Visit: Payer: 59

## 2017-01-28 DIAGNOSIS — M6281 Muscle weakness (generalized): Secondary | ICD-10-CM | POA: Diagnosis not present

## 2017-01-28 DIAGNOSIS — M25672 Stiffness of left ankle, not elsewhere classified: Secondary | ICD-10-CM

## 2017-01-28 DIAGNOSIS — M25572 Pain in left ankle and joints of left foot: Secondary | ICD-10-CM | POA: Diagnosis not present

## 2017-01-28 NOTE — Therapy (Signed)
[place] [facility] [address], [place], [address] Phone: [phone]   Fax:  [phone]  Physical Therapy Treatment  Patient Details  Name: Kelsey Randolph MRN: [id number] Date of Birth: 1975-10-28 Referring Provider: Samara Deist MD  Encounter Date: 01/28/2017      PT End of Session - 01/28/17 1808    Visit Number 16   Number of Visits 21   Date for PT Re-Evaluation 02/15/17   PT Start Time 6962   PT Stop Time 1730   PT Time Calculation (min) 45 min   Activity Tolerance Patient tolerated treatment well   Behavior During Therapy [facility] for tasks assessed/performed      Past Medical History:  Diagnosis Date  . Allergy   . Anxiety   . Depression   . GERD (gastroesophageal reflux disease)     Past Surgical History:  Procedure Laterality Date  . FOOT SURGERY Left   . kidney stones removed  2004  . tonsillectomy and adenoidectomy  1993    There were no vitals filed for this visit.      Subjective Assessment - 01/28/17 1729    Subjective Patient reports increased pain along her medial ankle today and states she hopes she's not regressing.    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 59min   Diagnostic tests X-Ray   Patient Stated Goals Walk without pain   Currently in Pain? Yes   Pain Score 4    Pain Location Ankle   Pain Orientation Left   Pain Descriptors / Indicators Aching   Pain Type Acute pain   Pain Onset More than a month ago   Pain Frequency Constant         TREATMENT   Dry Needling: (6 min unbilled) Three 164mm x .4 mm needles placed into the lower leg to directed towards the flexor digitorum longus and tibialis posterior muscle belly to decrease muscle spasms and dysfunction. (Patient given verbal consent before performing)    Therapeutic Exercise: Hip abduction in standing on airex- 2 x 20  55#  Heel raises on airex - x20  Toe Raises on airex pad - x20  Isometric Ankle inversion - 20 sec holds x 3 Ankle inversion GTB - x20 in sitting Single leg stance on airex - 3 x 78min Patient demonstrates decreased pain after performing manual therapy         PT Education - 01/28/17 1807    Education provided Yes   Education Details form/technique with exercise   Person(s) Educated Patient   Methods Explanation;Demonstration   Comprehension Verbalized understanding;Returned demonstration             PT Long Term Goals - 01/18/17 1411      PT LONG TERM GOAL #1   Title Patient will be independent with HEP focused on improving ankle function to continue benefits of therapy after discharge.   Baseline dependent with form/technique; 01/18/17:Moderate cueing required for exercise technique   Time 6   Period Weeks   Status New     PT LONG TERM GOAL #2   Title Patient will be able to perform boot camp exercise program without pain to demonstrate significant improvement in ankle function.   Baseline unable; 01/18/17: attempted with increased pain   Time 6   Period Weeks   Status New     PT LONG TERM GOAL #3  Title Patient will improve LEFS to 64/80 to demonstrate significant improvement in foot/ankle function and greater ability to walk without pain.    Baseline 40/80; 01/18/17: 45/80   Time 6   Period Weeks   Status New               Plan - 01/28/17 1809    Clinical Impression Statement Patient demonstrates significant decrease in pain after performing manual therapy and dry needling to the area. (Patient verbally conscented to the treatment). Patient demonstrate decreased balance with single leg stance as demonstrated by  increased postural sway and patient will benefit from further skilled therapy to return to prior level of function.    Rehab Potential Good   Clinical Impairments Affecting Rehab Potential (+) highly motivated (-) pain aggrvation with weight  bearing on LEs   PT Frequency 2x / week   PT Duration 6 weeks   PT Treatment/Interventions Cryotherapy;Electrical Stimulation;Iontophoresis 4mg /ml Dexamethasone;Moist Heat;Ultrasound;Therapeutic exercise;Therapeutic activities;Gait training;Balance training;Neuromuscular re-education;Patient/family education;Manual techniques;Dry needling;Passive range of motion   PT Next Visit Plan Progress strengthening on the L LE and hip; coordination of foot/ankle musculature   PT Home Exercise Plan isometrics plantarflexion/ inversion   Consulted and Agree with Plan of Care Patient      Patient will benefit from skilled therapeutic intervention in order to improve the following deficits and impairments:  Abnormal gait, Increased fascial restricitons, Pain, Decreased coordination, Decreased mobility, Decreased endurance, Decreased range of motion, Decreased strength, Difficulty walking, Impaired UE functional use  Visit Diagnosis: Stiffness of left ankle, not elsewhere classified  Pain in left ankle and joints of left foot  Muscle weakness (generalized)     Problem List Patient Active Problem List   Diagnosis Date Noted  . Abnormal blood chemistry 01/28/2015  . Allergic rhinitis 01/28/2015  . Anxiety 01/28/2015  . Chest pain 01/28/2015  . Family planning 01/28/2015  . Acid reflux 01/28/2015  . LBP (low back pain) 01/28/2015  . Plantar fasciitis 01/28/2015  . Borderline diabetes 01/28/2015  . Psoriasis 01/28/2015  . Avitaminosis D 01/28/2015  . Disease of white blood cells 11/22/2009  . Awareness of heartbeats 03/23/2008  . Adult hypothyroidism 03/14/2008  . History of tobacco use 11/30/2007  . Calculus of kidney 11/23/2007  . Arthritis with psoriasis ([place]) 12/03/2005  . Clinical depression 03/04/2001  . Extreme obesity 08/10/1998    Blythe Stanford, PT DPT 01/28/2017, 6:25 PM  [place] [facility] [address] [place], [address] Phone: [phone]   Fax:  [phone]  Name: LYNESSA ALMANZAR MRN: [id number] Date of Birth: 08-08-1976

## 2017-02-01 ENCOUNTER — Ambulatory Visit: Payer: 59

## 2017-02-01 DIAGNOSIS — M25672 Stiffness of left ankle, not elsewhere classified: Secondary | ICD-10-CM | POA: Diagnosis not present

## 2017-02-01 DIAGNOSIS — M4607 Spinal enthesopathy, lumbosacral region: Secondary | ICD-10-CM | POA: Diagnosis not present

## 2017-02-01 DIAGNOSIS — M25572 Pain in left ankle and joints of left foot: Secondary | ICD-10-CM | POA: Diagnosis not present

## 2017-02-01 DIAGNOSIS — M62830 Muscle spasm of back: Secondary | ICD-10-CM | POA: Diagnosis not present

## 2017-02-01 DIAGNOSIS — M9903 Segmental and somatic dysfunction of lumbar region: Secondary | ICD-10-CM | POA: Diagnosis not present

## 2017-02-01 DIAGNOSIS — M6281 Muscle weakness (generalized): Secondary | ICD-10-CM | POA: Diagnosis not present

## 2017-02-01 NOTE — Therapy (Signed)
[place] [facility] [address], [place], [address] Phone: [phone]   Fax:  [phone]  Physical Therapy Treatment  Patient Details  Name: Kelsey Randolph MRN: [id number] Date of Birth: Aug 08, 1976 Referring Provider: Samara Deist MD  Encounter Date: 02/01/2017      PT End of Session - 02/01/17 1325    Visit Number 17   Number of Visits 21   Date for PT Re-Evaluation 02/15/17   PT Start Time 6010   PT Stop Time 1345   PT Time Calculation (min) 40 min   Activity Tolerance Patient tolerated treatment well   Behavior During Therapy [facility] for tasks assessed/performed      Past Medical History:  Diagnosis Date  . Allergy   . Anxiety   . Depression   . GERD (gastroesophageal reflux disease)     Past Surgical History:  Procedure Laterality Date  . FOOT SURGERY Left   . kidney stones removed  2004  . tonsillectomy and adenoidectomy  1993    There were no vitals filed for this visit.      Subjective Assessment - 02/01/17 1321    Subjective Patient demonstrates decreased pain along ankle along her foot.    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 55min   Diagnostic tests X-Ray   Patient Stated Goals Walk without pain   Currently in Pain? Yes   Pain Score 2    Pain Location Ankle   Pain Orientation Left   Pain Descriptors / Indicators Aching   Pain Type Acute pain   Pain Onset More than a month ago      TREATMENT   Dry Needling: (4 min unbilled) Two 1102mm x .4 mm needles placed into the lower leg to directed towards the flexor digitorum longus and tibialis posterior muscle belly to decrease muscle spasms and dysfunction. (Patient given verbal consent before performing)    Therapeutic Exercise: Hip abduction in standing on airex- 2 x 20 Heel raises on the ground  - x20  Single leg stance on airex arch  rasises- x30 arch raises Single leg stance on the airex pad medial/lateral - x30 Single leg on bosu ball medial/lateral - x 30 Squats on the ground with proper LE alignment -- x 25  Side stepping up and over Bosu -- x 20    Patient reports decreased pain after dry needling.        PT Education - 02/01/17 1325    Education provided Yes   Education Details Form/technique with exercise   Person(s) Educated Patient   Methods Explanation;Demonstration   Comprehension Returned demonstration;Verbalized understanding             PT Long Term Goals - 01/18/17 1411      PT LONG TERM GOAL #1   Title Patient will be independent with HEP focused on improving ankle function to continue benefits of therapy after discharge.   Baseline dependent with form/technique; 01/18/17:Moderate cueing required for exercise technique   Time 6   Period Weeks   Status New     PT LONG TERM GOAL #2   Title Patient will be able to perform boot camp exercise program without pain to demonstrate significant improvement in ankle function.   Baseline unable; 01/18/17: attempted with increased pain   Time 6   Period Weeks   Status New     PT LONG TERM GOAL #  3   Title Patient will improve LEFS to 64/80 to demonstrate significant improvement in foot/ankle function and greater ability to walk without pain.    Baseline 40/80; 01/18/17: 45/80   Time 6   Period Weeks   Status New               Plan - 02/01/17 1337    Clinical Impression Statement Patient demonstrates no increase in pain throughout the session and patient demonstrates ability to tolerate exercises performed without increase in pain. Patient demonstrates decreased coordination and endurance with ankle exercises and will benefit from further skilled therapy to return to prior level of function.    Rehab Potential Good   Clinical Impairments Affecting Rehab Potential (+) highly motivated (-) pain aggrvation with weight bearing on LEs   PT  Frequency 2x / week   PT Duration 6 weeks   PT Treatment/Interventions Cryotherapy;Electrical Stimulation;Iontophoresis 4mg /ml Dexamethasone;Moist Heat;Ultrasound;Therapeutic exercise;Therapeutic activities;Gait training;Balance training;Neuromuscular re-education;Patient/family education;Manual techniques;Dry needling;Passive range of motion   PT Next Visit Plan Progress strengthening on the L LE and hip; coordination of foot/ankle musculature   PT Home Exercise Plan isometrics plantarflexion/ inversion   Consulted and Agree with Plan of Care Patient      Patient will benefit from skilled therapeutic intervention in order to improve the following deficits and impairments:  Abnormal gait, Increased fascial restricitons, Pain, Decreased coordination, Decreased mobility, Decreased endurance, Decreased range of motion, Decreased strength, Difficulty walking, Impaired UE functional use  Visit Diagnosis: Pain in left ankle and joints of left foot  Stiffness of left ankle, not elsewhere classified  Muscle weakness (generalized)     Problem List Patient Active Problem List   Diagnosis Date Noted  . Abnormal blood chemistry 01/28/2015  . Allergic rhinitis 01/28/2015  . Anxiety 01/28/2015  . Chest pain 01/28/2015  . Family planning 01/28/2015  . Acid reflux 01/28/2015  . LBP (low back pain) 01/28/2015  . Plantar fasciitis 01/28/2015  . Borderline diabetes 01/28/2015  . Psoriasis 01/28/2015  . Avitaminosis D 01/28/2015  . Disease of white blood cells 11/22/2009  . Awareness of heartbeats 03/23/2008  . Adult hypothyroidism 03/14/2008  . History of tobacco use 11/30/2007  . Calculus of kidney 11/23/2007  . Arthritis with psoriasis ([place]) 12/03/2005  . Clinical depression 03/04/2001  . Extreme obesity 08/10/1998    Blythe Stanford, PT DPT 02/01/2017, 1:44 PM  [place] [facility] [address], [place], [address] Phone:  [phone]   Fax:  [phone]  Name: Kelsey Randolph MRN: [id number] Date of Birth: [birth year]

## 2017-02-04 ENCOUNTER — Ambulatory Visit: Payer: 59

## 2017-02-04 DIAGNOSIS — M25672 Stiffness of left ankle, not elsewhere classified: Secondary | ICD-10-CM

## 2017-02-04 DIAGNOSIS — M25572 Pain in left ankle and joints of left foot: Secondary | ICD-10-CM

## 2017-02-04 DIAGNOSIS — M6281 Muscle weakness (generalized): Secondary | ICD-10-CM | POA: Diagnosis not present

## 2017-02-04 NOTE — Therapy (Signed)
[place] [facility] [address] [place], [address] Phone: [phone]   Fax:  [phone]  Physical Therapy Treatment  Patient Details  Name: Kelsey Randolph MRN: [id number] Date of Birth: [birth year] Referring Provider: Samara Deist MD  Encounter Date: 02/04/2017      PT End of Session - 02/04/17 1453    Visit Number 18   Number of Visits 21   Date for PT Re-Evaluation 02/15/17   PT Start Time 1430   PT Stop Time 1515   PT Time Calculation (min) 45 min   Activity Tolerance Patient tolerated treatment well   Behavior During Therapy [facility] for tasks assessed/performed      Past Medical History:  Diagnosis Date  . Allergy   . Anxiety   . Depression   . GERD (gastroesophageal reflux disease)     Past Surgical History:  Procedure Laterality Date  . FOOT SURGERY Left   . kidney stones removed  2004  . tonsillectomy and adenoidectomy  1993    There were no vitals filed for this visit.      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 57min   Diagnostic tests X-Ray   Patient Stated Goals Walk without pain   Currently in Pain? Yes   Pain Score 2    Pain Location Ankle   Pain Orientation Left   Pain Descriptors / Indicators Aching   Pain Type Acute pain   Pain Onset More than a month ago         Dry Needling: (4 min unbilled) Two 170mm x .4 mm needles placed into the lower leg to directed towards the flexor digitorum longus and tibialis posterior muscle belly to decrease muscle spasms and dysfunction. (Patient given verbal consent before performing)    Therapeutic Exercise: Heel raises on airex  - x20  Single leg stance on airex arch raises- x30  arch raises [employer] level 15 single leg with plantarflexion - x 20  Leaning against wall with ankle inversion strengthening - x 20  Side stepping up and over Bosu -- x 20  Weight shifting on Bosu black side up - x 20  Resisted dorsiflexion at [employer] - x20 - #25   Patient reports decreased pain after dry needling and fatigue after exercise performance        PT Education - 02/04/17 1453    Education provided Yes   Education Details Form/technique with exercise   Person(s) Educated Patient   Methods Explanation;Demonstration   Comprehension Verbalized understanding;Returned demonstration             PT Long Term Goals - 01/18/17 1411      PT LONG TERM GOAL #1   Title Patient will be independent with HEP focused on improving ankle function to continue benefits of therapy after discharge.   Baseline dependent with form/technique; 01/18/17:Moderate cueing required for exercise technique   Time 6   Period Weeks   Status New     PT LONG TERM GOAL #2   Title Patient will be able to perform boot camp exercise program without pain to demonstrate significant improvement in ankle function.   Baseline unable; 01/18/17: attempted with increased pain   Time 6  Period Weeks   Status New     PT LONG TERM GOAL #3   Title Patient will improve LEFS to 64/80 to demonstrate significant improvement in foot/ankle function and greater ability to walk without pain.    Baseline 40/80; 01/18/17: 45/80   Time 6   Period Weeks   Status New               Plan - 02/04/17 1504    Clinical Impression Statement Patient demonstrates improvement in ankle function with ability to perform greater amount exercises without increase in pain. Patient demonstrates decreased pain after performing dry needling indicating decreased spasms and patient will benefit from further skilled therapy to return to prior level of function.    Rehab Potential Good   Clinical Impairments Affecting Rehab Potential  (+) highly motivated (-) pain aggrvation with weight bearing on LEs   PT Frequency 2x / week   PT Duration 6 weeks   PT Treatment/Interventions Cryotherapy;Electrical Stimulation;Iontophoresis 4mg /ml Dexamethasone;Moist Heat;Ultrasound;Therapeutic exercise;Therapeutic activities;Gait training;Balance training;Neuromuscular re-education;Patient/family education;Manual techniques;Dry needling;Passive range of motion   PT Next Visit Plan Progress strengthening on the L LE and hip; coordination of foot/ankle musculature   PT Home Exercise Plan isometrics plantarflexion/ inversion   Consulted and Agree with Plan of Care Patient      Patient will benefit from skilled therapeutic intervention in order to improve the following deficits and impairments:  Abnormal gait, Increased fascial restricitons, Pain, Decreased coordination, Decreased mobility, Decreased endurance, Decreased range of motion, Decreased strength, Difficulty walking, Impaired UE functional use  Visit Diagnosis: Pain in left ankle and joints of left foot  Stiffness of left ankle, not elsewhere classified  Muscle weakness (generalized)     Problem List Patient Active Problem List   Diagnosis Date Noted  . Abnormal blood chemistry 01/28/2015  . Allergic rhinitis 01/28/2015  . Anxiety 01/28/2015  . Chest pain 01/28/2015  . Family planning 01/28/2015  . Acid reflux 01/28/2015  . LBP (low back pain) 01/28/2015  . Plantar fasciitis 01/28/2015  . Borderline diabetes 01/28/2015  . Psoriasis 01/28/2015  . Avitaminosis D 01/28/2015  . Disease of white blood cells 11/22/2009  . Awareness of heartbeats 03/23/2008  . Adult hypothyroidism 03/14/2008  . History of tobacco use 11/30/2007  . Calculus of kidney 11/23/2007  . Arthritis with psoriasis ([place]) 12/03/2005  . Clinical depression 03/04/2001  . Extreme obesity 08/10/1998    Blythe Stanford, PT DPT 02/04/2017, 3:14 PM  [facility] [place]  [facility] [address] [place], [address] Phone: [phone]   Fax:  [phone]  Name: Kelsey Randolph MRN: [id number] Date of Birth: 1976/05/09

## 2017-02-09 ENCOUNTER — Encounter: Payer: Self-pay | Admitting: Physical Therapy

## 2017-02-09 ENCOUNTER — Ambulatory Visit: Payer: 59 | Attending: Podiatry | Admitting: Physical Therapy

## 2017-02-09 DIAGNOSIS — M6281 Muscle weakness (generalized): Secondary | ICD-10-CM | POA: Diagnosis not present

## 2017-02-09 DIAGNOSIS — M25572 Pain in left ankle and joints of left foot: Secondary | ICD-10-CM | POA: Insufficient documentation

## 2017-02-09 DIAGNOSIS — M25672 Stiffness of left ankle, not elsewhere classified: Secondary | ICD-10-CM | POA: Diagnosis not present

## 2017-02-09 NOTE — Therapy (Signed)
[facility] [facility] [address] [place], [address] Phone: [phone]   Fax:  [phone]  Physical Therapy Treatment  Patient Details  Name: Kelsey Randolph MRN: [id number] Date of Birth: 29-May-1976 Referring Provider: Samara Deist MD  Encounter Date: 02/09/2017      PT End of Session - 02/09/17 1633    Visit Number 19   Number of Visits 21   Date for PT Re-Evaluation 02/15/17   PT Start Time 9449   PT Stop Time 1713   PT Time Calculation (min) 42 min   Activity Tolerance Patient tolerated treatment well   Behavior During Therapy [employer] for tasks assessed/performed      Past Medical History:  Diagnosis Date  . Allergy   . Anxiety   . Depression   . GERD (gastroesophageal reflux disease)     Past Surgical History:  Procedure Laterality Date  . FOOT SURGERY Left   . kidney stones removed  2004  . tonsillectomy and adenoidectomy  1993    There were no vitals filed for this visit.      Subjective Assessment - 02/09/17 1635    Subjective Pt reports her R ankle has been more swollen beginning this past weekend.  She has been using ice and keeping it elevated which seems to be helping.  Pt has been completing her HEP without any questions or concerns.    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 40min   Diagnostic tests X-Ray   Patient Stated Goals Walk without pain   Currently in Pain? Yes   Pain Score 3    Pain Location Ankle   Pain Orientation Right   Pain Descriptors / Indicators Aching   Pain Type Chronic pain   Pain Onset More than a month ago   Multiple Pain Sites Yes   Pain Score 4   Pain Location Ankle   Pain Orientation Left   Pain Descriptors / Indicators Aching   Pain Type Chronic pain   Pain Onset More than a month ago   Pain Frequency Constant       TREATMENT   Therapeutic Exercise:   Heel raises on airex - x20   Single leg stance on airex arch raises- x30 arch raises   Heel walking 20ft x4. Noted increased difficulty maintaining L ankle DF throughout.   Weight shifting on Bosu black side up - x 15 forward and back and x15 L and R   Mini squats on Bosu black side up. X10 with intermittent UE support.   Side stepping up and over Bosu -- x 20   Total Gym level 15 single leg with plantarflexion - x 20 each LE. Repeated at level 18 x10 each LE   Seated Bil ankle inversion with GTB x15 each LE   Seated Bil ankle DF with GTB x15 each LE   Toe walking 68ft x4   Anterior and posterior rocking on rockerboard x15 each direction   Lateral rocking on rockerboard x15 each direction          PT Education - 02/09/17 1633    Education provided Yes   Education Details Exercise technique   Person(s) Educated Patient   Methods Explanation;Demonstration;Verbal cues   Comprehension Verbalized understanding;Returned demonstration;Verbal cues required;Need further instruction             PT Long Term Goals - 01/18/17 1411  PT LONG TERM GOAL #1   Title Patient will be independent with HEP focused on improving ankle function to continue benefits of therapy after discharge.   Baseline dependent with form/technique; 01/18/17:Moderate cueing required for exercise technique   Time 6   Period Weeks   Status New     PT LONG TERM GOAL #2   Title Patient will be able to perform boot camp exercise program without pain to demonstrate significant improvement in ankle function.   Baseline unable; 01/18/17: attempted with increased pain   Time 6   Period Weeks   Status New     PT LONG TERM GOAL #3   Title Patient will improve LEFS to 64/80 to demonstrate significant improvement in foot/ankle function and greater ability to walk without pain.    Baseline 40/80; 01/18/17: 45/80   Time 6   Period Weeks   Status New               Plan - 02/09/17 1657     Clinical Impression Statement Pt demonstrates instability on Bosu ball due to ankle instability and requires intermittent UE support.  She fatigues with increase in intensity of ankle PF Bil on total gym.  Pt will benefit from continued skilled PT interventions for improved stability, strength, and functional use of BLEs.     Rehab Potential Good   Clinical Impairments Affecting Rehab Potential (+) highly motivated (-) pain aggrvation with weight bearing on LEs   PT Frequency 2x / week   PT Duration 6 weeks   PT Treatment/Interventions Cryotherapy;Electrical Stimulation;Iontophoresis 4mg /ml Dexamethasone;Moist Heat;Ultrasound;Therapeutic exercise;Therapeutic activities;Gait training;Balance training;Neuromuscular re-education;Patient/family education;Manual techniques;Dry needling;Passive range of motion   PT Next Visit Plan Progress strengthening on the L LE and hip; coordination of foot/ankle musculature   PT Home Exercise Plan isometrics plantarflexion/ inversion   Consulted and Agree with Plan of Care Patient      Patient will benefit from skilled therapeutic intervention in order to improve the following deficits and impairments:  Abnormal gait, Increased fascial restricitons, Pain, Decreased coordination, Decreased mobility, Decreased endurance, Decreased range of motion, Decreased strength, Difficulty walking, Impaired UE functional use  Visit Diagnosis: Pain in left ankle and joints of left foot  Stiffness of left ankle, not elsewhere classified  Muscle weakness (generalized)     Problem List Patient Active Problem List   Diagnosis Date Noted  . Abnormal blood chemistry 01/28/2015  . Allergic rhinitis 01/28/2015  . Anxiety 01/28/2015  . Chest pain 01/28/2015  . Family planning 01/28/2015  . Acid reflux 01/28/2015  . LBP (low back pain) 01/28/2015  . Plantar fasciitis 01/28/2015  . Borderline diabetes 01/28/2015  . Psoriasis 01/28/2015  . Avitaminosis D 01/28/2015  .  Disease of white blood cells 11/22/2009  . Awareness of heartbeats 03/23/2008  . Adult hypothyroidism 03/14/2008  . History of tobacco use 11/30/2007  . Calculus of kidney 11/23/2007  . 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 Clinical depression 03/04/2001  . Extreme obesity 08/10/1998    Collie Siad PT, DPT 02/09/2017, 5:13 PM  [facility] [address] [place], [address] Phone: [phone]   Fax:  [phone]  Name: Kelsey Randolph MRN: [id number] Date of Birth: 09/03/1975

## 2017-02-12 ENCOUNTER — Ambulatory Visit: Payer: 59 | Admitting: Physical Therapy

## 2017-02-12 DIAGNOSIS — M25572 Pain in left ankle and joints of left foot: Secondary | ICD-10-CM

## 2017-02-12 DIAGNOSIS — M25672 Stiffness of left ankle, not elsewhere classified: Secondary | ICD-10-CM | POA: Diagnosis not present

## 2017-02-12 DIAGNOSIS — M6281 Muscle weakness (generalized): Secondary | ICD-10-CM

## 2017-02-15 ENCOUNTER — Ambulatory Visit: Payer: 59

## 2017-02-15 DIAGNOSIS — M25572 Pain in left ankle and joints of left foot: Secondary | ICD-10-CM

## 2017-02-15 DIAGNOSIS — M6281 Muscle weakness (generalized): Secondary | ICD-10-CM | POA: Diagnosis not present

## 2017-02-15 DIAGNOSIS — M25672 Stiffness of left ankle, not elsewhere classified: Secondary | ICD-10-CM

## 2017-02-15 NOTE — Therapy (Signed)
[facility] [address], [place], [address] Phone: [phone]   Fax:  [phone]  Physical Therapy Treatment  Patient Details  Name: Kelsey Randolph MRN: [id number] Date of Birth: [birth year] Referring Provider: Samara Deist MD  Encounter Date: 02/15/2017      PT End of Session - 02/15/17 1710    Visit Number 21   Number of Visits 21   Date for PT Re-Evaluation 02/15/17   PT Start Time 7824   PT Stop Time 2353   PT Time Calculation (min) 30 min   Activity Tolerance Patient tolerated treatment well   Behavior During Therapy [facility] for tasks assessed/performed      Past Medical History:  Diagnosis Date  . Allergy   . Anxiety   . Depression   . GERD (gastroesophageal reflux disease)     Past Surgical History:  Procedure Laterality Date  . FOOT SURGERY Left   . kidney stones removed  2004  . tonsillectomy and adenoidectomy  1993    There were no vitals filed for this visit.      Subjective Assessment - 02/15/17 1705    Subjective Patient reports her pain has been improving with her heel lifts. Patient states she has contacted someone about making orthopedics.   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 67min   Diagnostic tests X-Ray   Patient Stated Goals Walk without pain   Currently in Pain? Yes   Pain Score 2    Pain Location Arm   Pain Orientation Right;Left   Pain Descriptors / Indicators Aching   Pain Type Chronic pain   Pain Onset More than a month ago        TREATMENT:  Dry Needling: (4 min unbilled) Two 186mm x .4 mm needles placed into the lower leg to directed towards the flexor digitorum longus and tibialis posterior muscle belly to decrease muscle spasms and dysfunction. (Patient given verbal consent before performing)    Therapeutic Exercise: Heel raises off of airex  - x20  Single  leg stance on airex with ball circles - x 20  CKC dorsiflexion at the wall - x 20  Prostretch B calf stretching - x 20    Patient reports decreased pain after dry needling.        PT Education - 02/15/17 1709    Education provided Yes   Education Details Follow up with heel lifts   Person(s) Educated Patient   Methods Demonstration;Explanation   Comprehension Returned demonstration;Verbalized understanding             PT Long Term Goals - 01/18/17 1411      PT LONG TERM GOAL #1   Title Patient will be independent with HEP focused on improving ankle function to continue benefits of therapy after discharge.   Baseline dependent with form/technique; 01/18/17:Moderate cueing required for exercise technique   Time 6   Period Weeks   Status New     PT LONG TERM GOAL #2   Title Patient will be able to perform boot camp exercise program without pain to demonstrate significant improvement in ankle function.   Baseline unable; 01/18/17: attempted with increased pain   Time 6   Period Weeks   Status New     PT LONG TERM GOAL #3   Title Patient will improve LEFS to 64/80 to demonstrate significant improvement in foot/ankle function and  greater ability to walk without pain.    Baseline 40/80; 01/18/17: 45/80   Time 6   Period Weeks   Status New               Plan - 02/15/17 1711    Clinical Impression Statement Patient demonstrates decreased dorsiflexion and focused on improving ankle AROM. Patient demonstrates improvement with heel lift placed and shoe indicating she would further benefit from orthotic support. Patient will beneift from further skilled therapy focused on improving intrinsic foot musculature to return to prior level of function.    Rehab Potential Good   Clinical Impairments Affecting Rehab Potential (+) highly motivated (-) pain aggrvation with weight bearing on LEs   PT Frequency 2x / week   PT Duration 6 weeks   PT Treatment/Interventions  Cryotherapy;Electrical Stimulation;Iontophoresis 4mg /ml Dexamethasone;Moist Heat;Ultrasound;Therapeutic exercise;Therapeutic activities;Gait training;Balance training;Neuromuscular re-education;Patient/family education;Manual techniques;Dry needling;Passive range of motion   PT Next Visit Plan Progress strengthening on the L LE and hip; coordination of foot/ankle musculature   PT Home Exercise Plan isometrics plantarflexion/ inversion   Consulted and Agree with Plan of Care Patient      Patient will benefit from skilled therapeutic intervention in order to improve the following deficits and impairments:  Abnormal gait, Increased fascial restricitons, Pain, Decreased coordination, Decreased mobility, Decreased endurance, Decreased range of motion, Decreased strength, Difficulty walking, Impaired UE functional use  Visit Diagnosis: Pain in left ankle and joints of left foot  Stiffness of left ankle, not elsewhere classified  Muscle weakness (generalized)     Problem List Patient Active Problem List   Diagnosis Date Noted  . Abnormal blood chemistry 01/28/2015  . Allergic rhinitis 01/28/2015  . Anxiety 01/28/2015  . Chest pain 01/28/2015  . Family planning 01/28/2015  . Acid reflux 01/28/2015  . LBP (low back pain) 01/28/2015  . Plantar fasciitis 01/28/2015  . Borderline diabetes 01/28/2015  . Psoriasis 01/28/2015  . Avitaminosis D 01/28/2015  . Disease of white blood cells 11/22/2009  . Awareness of heartbeats 03/23/2008  . Adult hypothyroidism 03/14/2008  . History of tobacco use 11/30/2007  . Calculus of kidney 11/23/2007  . 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 Clinical depression 03/04/2001  . Extreme obesity 08/10/1998    Blythe Stanford, PT DPT 02/15/2017, 5:18 PM  [place] [facility] [address], [place], [address] Phone: [phone]   Fax:  [phone]  Name: ASHLEYMARIE GRANDERSON MRN:  [id number] Date of Birth: Jan 21, 1976

## 2017-02-15 NOTE — Therapy (Signed)
[place] [facility] [facility] [address] [place], [address] Phone: [phone]   Fax:  [phone]  Physical Therapy Treatment  Patient Details  Name: Kelsey Randolph MRN: [id number] Date of Birth: 06-Jun-1976 Referring Provider: Samara Deist MD  Encounter Date: 02/12/2017      PT End of Session - 02/15/17 1201    Visit Number 20   Number of Visits 21   Date for PT Re-Evaluation 02/15/17   PT Start Time 4627   PT Stop Time 1340   PT Time Calculation (min) 45 min   Activity Tolerance Patient tolerated treatment well   Behavior During Therapy [facility] for tasks assessed/performed      Past Medical History:  Diagnosis Date  . Allergy   . Anxiety   . Depression   . GERD (gastroesophageal reflux disease)     Past Surgical History:  Procedure Laterality Date  . FOOT SURGERY Left   . kidney stones removed  2004  . tonsillectomy and adenoidectomy  1993    There were no vitals filed for this visit.      Subjective Assessment - 02/15/17 1157    Subjective Patient reports she had been improving with symptoms, but has had increased pain over the past few weeks.    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 57min   Diagnostic tests X-Ray   Patient Stated Goals Walk without pain   Currently in Pain? Yes  Reports mild to moderate pain along the posterior tibialis tendon and muscle belly.   Pain Location Ankle   Pain Orientation Right;Left   Pain Descriptors / Indicators Aching   Pain Type Chronic pain   Pain Onset More than a month ago   Pain Frequency Intermittent   Aggravating Factors  Weightbearing    Pain Relieving Factors Ice, rest      Assessed standing posture in single leg stance, notable for arch height loss bilaterally L>R, notable for considerable pronation and hallux valgus bilaterally   Performed DF mobilizations in  supine grade IV x 6 bouts x 45" with DF mobilizations at the talus performed as well.   Performed soft tissue mobilizations over the posterior tibialis tendon and muscle belly with multiple points of tenderness/pain reported. She reports decreased pain after completion of STM.   Observed patient with heel lift provided and assessed response in gait. Patient reports noticeable decrease in pain levels with heel lift provided. Educated patient to ambulate with heel lift over the weekend though remove temporarily as she allows soft tissue time to accomodate for changes.   Performed isometric inversion manually resisted bouts x 10 for 3 sets with 3-5" holds                             PT Education - 02/15/17 1201    Education provided Yes   Education Details Will see how she does with heel lift, may refer out for orthotics   Person(s) Educated Patient   Methods Explanation;Demonstration;Handout   Comprehension Verbalized understanding;Returned demonstration             PT Long Term Goals - 01/18/17 1411      PT LONG TERM GOAL #1   Title Patient will be independent with HEP focused on improving ankle function to continue benefits of therapy after discharge.   Baseline dependent with form/technique; 01/18/17:Moderate  cueing required for exercise technique   Time 6   Period Weeks   Status New     PT LONG TERM GOAL #2   Title Patient will be able to perform boot camp exercise program without pain to demonstrate significant improvement in ankle function.   Baseline unable; 01/18/17: attempted with increased pain   Time 6   Period Weeks   Status New     PT LONG TERM GOAL #3   Title Patient will improve LEFS to 64/80 to demonstrate significant improvement in foot/ankle function and greater ability to walk without pain.    Baseline 40/80; 01/18/17: 45/80   Time 6   Period Weeks   Status New               Plan - 02/15/17 1202    Clinical Impression  Statement Patient demonstrates some of the classic factors associated with posterior tibial tendonitis including decreased arch height, toe off through medial portion of great toe and pronation in weightbearing. She was provided with heel cord stretching and heel lift in this session as well as soft tissue mobilization for acute pain management. She does report noticeable improvement in symptoms with heel lift/heel cord stretching, which may indicate she is a candidate for orthotics with medial post and heel lift. Will continue to monitor her response to discussed treatments.    Rehab Potential Good   Clinical Impairments Affecting Rehab Potential (+) highly motivated (-) pain aggrvation with weight bearing on LEs   PT Frequency 2x / week   PT Duration 6 weeks   PT Treatment/Interventions Cryotherapy;Electrical Stimulation;Iontophoresis 4mg /ml Dexamethasone;Moist Heat;Ultrasound;Therapeutic exercise;Therapeutic activities;Gait training;Balance training;Neuromuscular re-education;Patient/family education;Manual techniques;Dry needling;Passive range of motion   PT Next Visit Plan Progress strengthening on the L LE and hip; coordination of foot/ankle musculature   PT Home Exercise Plan isometrics plantarflexion/ inversion   Consulted and Agree with Plan of Care Patient      Patient will benefit from skilled therapeutic intervention in order to improve the following deficits and impairments:  Abnormal gait, Increased fascial restricitons, Pain, Decreased coordination, Decreased mobility, Decreased endurance, Decreased range of motion, Decreased strength, Difficulty walking, Impaired UE functional use  Visit Diagnosis: Pain in left ankle and joints of left foot  Stiffness of left ankle, not elsewhere classified  Muscle weakness (generalized)     Problem List Patient Active Problem List   Diagnosis Date Noted  . Abnormal blood chemistry 01/28/2015  . Allergic rhinitis 01/28/2015  . Anxiety  01/28/2015  . Chest pain 01/28/2015  . Family planning 01/28/2015  . Acid reflux 01/28/2015  . LBP (low back pain) 01/28/2015  . Plantar fasciitis 01/28/2015  . Borderline diabetes 01/28/2015  . Psoriasis 01/28/2015  . Avitaminosis D 01/28/2015  . Disease of white blood cells 11/22/2009  . Awareness of heartbeats 03/23/2008  . Adult hypothyroidism 03/14/2008  . History of tobacco use 11/30/2007  . Calculus of kidney 11/23/2007  . 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 Clinical depression 03/04/2001  . Extreme obesity 08/10/1998    Royce Macadamia PT, DPT, CSCS    02/15/2017, 12:05 PM  [place] [facility] [address] [place], [address] Phone: [phone]   Fax:  [phone]  Name: ROBINN OVERHOLT MRN: [id number] Date of Birth: Oct 01, 1975

## 2017-02-17 ENCOUNTER — Ambulatory Visit: Payer: 59

## 2017-02-18 ENCOUNTER — Ambulatory Visit: Payer: 59

## 2017-02-18 DIAGNOSIS — M6281 Muscle weakness (generalized): Secondary | ICD-10-CM

## 2017-02-18 DIAGNOSIS — M25672 Stiffness of left ankle, not elsewhere classified: Secondary | ICD-10-CM

## 2017-02-18 DIAGNOSIS — M25572 Pain in left ankle and joints of left foot: Secondary | ICD-10-CM

## 2017-02-18 NOTE — Therapy (Signed)
[place] [address], [place], [address] Phone: [phone]   Fax:  [phone]  Physical Therapy Treatment  Patient Details  Name: Kelsey Randolph MRN: [id number] Date of Birth: May 07, 1976 Referring Provider: Samara Deist MD  Encounter Date: 02/18/2017      PT End of Session - 02/18/17 1556    Visit Number 22   Number of Visits 29   Date for PT Re-Evaluation 03/18/17   PT Start Time 4742   PT Stop Time 1600   PT Time Calculation (min) 43 min   Activity Tolerance Patient tolerated treatment well   Behavior During Therapy [facility] for tasks assessed/performed      Past Medical History:  Diagnosis Date  . Allergy   . Anxiety   . Depression   . GERD (gastroesophageal reflux disease)     Past Surgical History:  Procedure Laterality Date  . FOOT SURGERY Left   . kidney stones removed  2004  . tonsillectomy and adenoidectomy  1993    There were no vitals filed for this visit.      Subjective Assessment - 02/18/17 1545    Subjective Patient reports her pain is much improved since the use of the heel lifts. Patient reports she's going to see a PT for her orthotics.    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 22min   Diagnostic tests X-Ray   Patient Stated Goals Walk without pain   Currently in Pain? Yes   Pain Score 2    Pain Location Ankle   Pain Orientation Left   Pain Descriptors / Indicators Aching   Pain Type Chronic pain   Pain Onset More than a month ago       TREATMENT:   Dry Needling: (4 min unbilled) One 140mm x .4 mm needles placed into the lower leg to directed towards the flexor digitorum longus and tibialis posterior muscle belly to decrease muscle spasms and dysfunction. (Patient given verbal consent before performing)    Therapeutic Exercise: Single leg ball toss at rebounder -  x 20  B Heel raises off of airex  - x20  Single leg inversion with leaning against wall and pushing off - x 20  Tandem Ambulation down airex beam - x 20  Single leg stance ant/post weight shifts; lateral weight shifts in standing on dynadisc.-x 20 each direction Single leg stance stretch against on ramp board for gastrocnemius - 4 x 1 min   Patient reports decreased pain after dry needling.   Observation: LEFS: 53/80      PT Education - 02/18/17 1548    Education provided Yes   Education Details Form/technique with exercise   Person(s) Educated Patient   Methods Explanation;Demonstration   Comprehension Verbalized understanding;Returned demonstration             PT Long Term Goals - 02/18/17 1549      PT LONG TERM GOAL #1   Title Patient will be independent with HEP focused on improving ankle function to continue benefits of therapy after discharge.   Baseline dependent with form/technique; 01/18/17:Moderate cueing required for exercise technique; 02/18/2017: minimal cueing to perform   Time 6   Period Weeks   Status On-going     PT LONG TERM GOAL #2   Title Patient will be able to perform boot camp exercise program without pain to demonstrate significant improvement in ankle  function.   Baseline unable; 01/18/17: attempted with increased pain; 02/18/2017: able to perform but requires use of ankle brace and avoids ballistic movements   Time 6   Period Weeks   Status On-going     PT LONG TERM GOAL #3   Title Patient will improve LEFS to 64/80 to demonstrate significant improvement in foot/ankle function and greater ability to walk without pain.    Baseline 40/80; 01/18/17: 45/80 02/18/17: 48/80   Time 6   Period Weeks   Status On-going               Plan - 02/18/17 1557    Clinical Impression Statement Patient demonstrates improvement in LEFS, ability to perform HEP, and exercise program at the gym. Patient continues to have constant pain but has significanntly  decreased since the beginning  of therapy indicating functional improvement of her ankle function. Patient will benefit from further skilled therapy focused on improving ankle function to return to prior level of function.    Rehab Potential Good   Clinical Impairments Affecting Rehab Potential (+) highly motivated (-) pain aggrvation with weight bearing on LEs   PT Frequency 2x / week   PT Duration 6 weeks   PT Treatment/Interventions Cryotherapy;Electrical Stimulation;Iontophoresis 4mg /ml Dexamethasone;Moist Heat;Ultrasound;Therapeutic exercise;Therapeutic activities;Gait training;Balance training;Neuromuscular re-education;Patient/family education;Manual techniques;Dry needling;Passive range of motion   PT Next Visit Plan Progress strengthening on the L LE and hip; coordination of foot/ankle musculature   PT Home Exercise Plan isometrics plantarflexion/ inversion   Consulted and Agree with Plan of Care Patient      Patient will benefit from skilled therapeutic intervention in order to improve the following deficits and impairments:  Abnormal gait, Increased fascial restricitons, Pain, Decreased coordination, Decreased mobility, Decreased endurance, Decreased range of motion, Decreased strength, Difficulty walking, Impaired UE functional use  Visit Diagnosis: Stiffness of left ankle, not elsewhere classified - Plan: PT plan of care cert/re-cert  Muscle weakness (generalized) - Plan: PT plan of care cert/re-cert  Pain in left ankle and joints of left foot - Plan: PT plan of care cert/re-cert     Problem List Patient Active Problem List   Diagnosis Date Noted  . Abnormal blood chemistry 01/28/2015  . Allergic rhinitis 01/28/2015  . Anxiety 01/28/2015  . Chest pain 01/28/2015  . Family planning 01/28/2015  . Acid reflux 01/28/2015  . LBP (low back pain) 01/28/2015  . Plantar fasciitis 01/28/2015  . Borderline diabetes 01/28/2015  . Psoriasis 01/28/2015  . Avitaminosis D 01/28/2015   . Disease of white blood cells 11/22/2009  . Awareness of heartbeats 03/23/2008  . Adult hypothyroidism 03/14/2008  . History of tobacco use 11/30/2007  . Calculus of kidney 11/23/2007  . 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 Clinical depression 03/04/2001  . Extreme obesity 08/10/1998    Blythe Stanford, PT DPT 02/18/2017, 4:03 PM  [place] [facility] [address] [place], [address] Phone: [phone]   Fax:  [phone]  Name: ALAYJA ARMAS MRN: [id number] Date of Birth: July 15, 1976

## 2017-02-19 ENCOUNTER — Other Ambulatory Visit: Payer: Self-pay | Admitting: Physician Assistant

## 2017-02-19 MED FILL — HUMIRA PEN 40 MG/0.8ML PNKT: 40 | 28 days supply | Qty: 2 | Fill #2

## 2017-02-19 NOTE — Telephone Encounter (Signed)
[facility] pharmacy faxed a request on the following medication. Thanks [facility]  levothyroxine (SYNTHROID, LEVOTHROID) 50 MCG tablet  Take 1 tablet (50 MCG Total ) by mouth daily.

## 2017-02-22 MED ORDER — LEVOTHYROXINE SODIUM 50 MCG PO TABS: 50.0000 ug | ORAL_TABLET | Freq: Every day | ORAL | 3 refills | Status: DC

## 2017-02-23 ENCOUNTER — Ambulatory Visit: Payer: 59

## 2017-02-24 DIAGNOSIS — H5203 Hypermetropia, bilateral: Secondary | ICD-10-CM | POA: Diagnosis not present

## 2017-02-25 ENCOUNTER — Ambulatory Visit: Payer: 59

## 2017-02-25 DIAGNOSIS — M25572 Pain in left ankle and joints of left foot: Secondary | ICD-10-CM

## 2017-02-25 DIAGNOSIS — M25672 Stiffness of left ankle, not elsewhere classified: Secondary | ICD-10-CM | POA: Diagnosis not present

## 2017-02-25 DIAGNOSIS — M76829 Posterior tibial tendinitis, unspecified leg: Secondary | ICD-10-CM | POA: Diagnosis not present

## 2017-02-25 DIAGNOSIS — M6281 Muscle weakness (generalized): Secondary | ICD-10-CM

## 2017-02-25 NOTE — Therapy (Signed)
[place] [facility] [address], [place], [address] Phone: [phone]   Fax:  [phone]  Physical Therapy Treatment  Patient Details  Name: Kelsey Randolph MRN: [id number] Date of Birth: [birth year] Referring Provider: Samara Deist MD  Encounter Date: 02/25/2017      PT End of Session - 02/25/17 1823    Visit Number 23   Number of Visits 29   Date for PT Re-Evaluation 03/18/17   PT Start Time 8250   PT Stop Time 1830   PT Time Calculation (min) 45 min   Activity Tolerance Patient tolerated treatment well   Behavior During Therapy [facility] for tasks assessed/performed      Past Medical History:  Diagnosis Date  . Allergy   . Anxiety   . Depression   . GERD (gastroesophageal reflux disease)     Past Surgical History:  Procedure Laterality Date  . FOOT SURGERY Left   . kidney stones removed  2004  . tonsillectomy and adenoidectomy  1993    There were no vitals filed for this visit.      Subjective Assessment - 02/25/17 1817    Subjective Patient reports her pain has singificantly improved and states she went and received her orthotics today.    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 24min   Diagnostic tests X-Ray   Patient Stated Goals Walk without pain   Currently in Pain? Yes   Pain Score 1    Pain Location Ankle   Pain Orientation Left   Pain Descriptors / Indicators Aching   Pain Type Chronic pain   Pain Onset More than a month ago   Pain Frequency Intermittent      TREATMENT:   Dry Needling: (5 min unbilled) One 142mm x .4 mm needle placed into the lower leg to directed towards the flexor digitorum longus and tibialis posterior muscle belly to decrease muscle spasms and dysfunction. (Patient given verbal consent before performing)    Therapeutic Exercise: Tandem Ambulation down airex beam - x 20   Side stepping down airex beam - x10 down and back Hip machine - hip abduction - x25, x15 40# Heel raises off of step  - 2x20  Single leg inversion with leaning against wall and pushing off - x 20  Plantarflexion stretch - 2 x 30sec Lunges in standing (static) - x15 B  Patient demonstrates increased fatigue at end of the session      PT Education - 02/25/17 1822    Education provided Yes   Education Details form/technique with exercise   Person(s) Educated Patient   Methods Explanation;Demonstration   Comprehension Verbalized understanding;Returned demonstration             PT Long Term Goals - 02/18/17 1549      PT LONG TERM GOAL #1   Title Patient will be independent with HEP focused on improving ankle function to continue benefits of therapy after discharge.   Baseline dependent with form/technique; 01/18/17:Moderate cueing required for exercise technique; 02/18/2017: minimal cueing to perform   Time 6   Period Weeks   Status On-going     PT LONG TERM GOAL #2   Title Patient will be able to perform boot camp exercise program without pain to demonstrate significant improvement in ankle function.   Baseline unable; 01/18/17: attempted with increased pain; 02/18/2017: able to perform but requires use of ankle  brace and avoids ballistic movements   Time 6   Period Weeks   Status On-going     PT LONG TERM GOAL #3   Title Patient will improve LEFS to 64/80 to demonstrate significant improvement in foot/ankle function and greater ability to walk without pain.    Baseline 40/80; 01/18/17: 45/80 02/18/17: 48/80   Time 6   Period Weeks   Status On-going               Plan - 02/25/17 1824    Clinical Impression Statement Patient demonstrates improvement in ankle pain and is ability to tolerate advanced exercises without increase in pain indciating functional improvement over treatment sessions. Patient continues to demonstrate inability to perform higher level workouts  without increased fatigue. Patient will benefit from further skilled therapy to return to prior level of function.    Rehab Potential Good   Clinical Impairments Affecting Rehab Potential (+) highly motivated (-) pain aggrvation with weight bearing on LEs   PT Frequency 2x / week   PT Duration 6 weeks   PT Treatment/Interventions Cryotherapy;Electrical Stimulation;Iontophoresis 4mg /ml Dexamethasone;Moist Heat;Ultrasound;Therapeutic exercise;Therapeutic activities;Gait training;Balance training;Neuromuscular re-education;Patient/family education;Manual techniques;Dry needling;Passive range of motion   PT Next Visit Plan Progress strengthening on the L LE and hip; coordination of foot/ankle musculature   PT Home Exercise Plan isometrics plantarflexion/ inversion   Consulted and Agree with Plan of Care Patient      Patient will benefit from skilled therapeutic intervention in order to improve the following deficits and impairments:  Abnormal gait, Increased fascial restricitons, Pain, Decreased coordination, Decreased mobility, Decreased endurance, Decreased range of motion, Decreased strength, Difficulty walking, Impaired UE functional use  Visit Diagnosis: Stiffness of left ankle, not elsewhere classified  Muscle weakness (generalized)  Pain in left ankle and joints of left foot     Problem List Patient Active Problem List   Diagnosis Date Noted  . Abnormal blood chemistry 01/28/2015  . Allergic rhinitis 01/28/2015  . Anxiety 01/28/2015  . Chest pain 01/28/2015  . Family planning 01/28/2015  . Acid reflux 01/28/2015  . LBP (low back pain) 01/28/2015  . Plantar fasciitis 01/28/2015  . Borderline diabetes 01/28/2015  . Psoriasis 01/28/2015  . Avitaminosis D 01/28/2015  . Disease of white blood cells 11/22/2009  . Awareness of heartbeats 03/23/2008  . Adult hypothyroidism 03/14/2008  . History of tobacco use 11/30/2007  . Calculus of kidney 11/23/2007  . Arthritis with  psoriasis ([place]) 12/03/2005  . Clinical depression 03/04/2001  . Extreme obesity 08/10/1998    Blythe Stanford, PT DPT 02/25/2017, 6:29 PM  [place] [facility] [address], [place], [address] Phone: [phone]   Fax:  [phone]  Name: Kelsey Randolph MRN: [id number] Date of Birth: 1976-01-20

## 2017-03-02 ENCOUNTER — Ambulatory Visit: Payer: 59

## 2017-03-04 ENCOUNTER — Ambulatory Visit: Payer: 59

## 2017-03-04 DIAGNOSIS — M25672 Stiffness of left ankle, not elsewhere classified: Secondary | ICD-10-CM | POA: Diagnosis not present

## 2017-03-04 DIAGNOSIS — M25572 Pain in left ankle and joints of left foot: Secondary | ICD-10-CM | POA: Diagnosis not present

## 2017-03-04 DIAGNOSIS — M6281 Muscle weakness (generalized): Secondary | ICD-10-CM

## 2017-03-04 NOTE — Therapy (Signed)
[place] [facility] [address] [place], [address] Phone: [phone]   Fax:  [phone]  Physical Therapy Treatment  Patient Details  Name: Kelsey Randolph MRN: [id number] Date of Birth: [birth year] Referring Provider: Samara Deist MD  Encounter Date: 03/04/2017      PT End of Session - 03/04/17 0935    Visit Number 24   Number of Visits 29   Date for PT Re-Evaluation 03/18/17   PT Start Time 0900   PT Stop Time 0945   PT Time Calculation (min) 45 min   Activity Tolerance Patient tolerated treatment well   Behavior During Therapy [facility] for tasks assessed/performed      Past Medical History:  Diagnosis Date  . Allergy   . Anxiety   . Depression   . GERD (gastroesophageal reflux disease)     Past Surgical History:  Procedure Laterality Date  . FOOT SURGERY Left   . kidney stones removed  2004  . tonsillectomy and adenoidectomy  1993    There were no vitals filed for this visit.      Subjective Assessment - 03/04/17 0927    Subjective Patient reports the pain has continued to decrease over the past week. Patient reports increased pain on the bottom of her foot.   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 71min   Diagnostic tests X-Ray   Patient Stated Goals Walk without pain   Currently in Pain? Yes   Pain Score 1    Pain Location Ankle   Pain Orientation Left   Pain Descriptors / Indicators Aching   Pain Type Chronic pain   Pain Onset More than a month ago      TREATMENT:   Dry Needling: (5 min unbilled) One 153mm x .4 mm needle placed into the lower leg to directed towards the flexor digitorum longus and tibialis posterior muscle belly to decrease muscle spasms and dysfunction. (Patient given verbal consent before performing)    Therapeutic Exercise: Heel raises off of step - 2 x 20  Arch lifts in  standing - x20 Feet together ball toss with heel lifts B - x 20 Hip machine - hip abduction - x25 85# B, x25 140# B  Heel walking/ toe walking - 4 x 20 ft Ankle/Gastroc stretching in standing off of step- 2 x30sec B   Patient demonstrates increased fatigue at end of the session       PT Education - 03/04/17 0934    Education provided Yes   Education Details form/technique with exercise   Person(s) Educated Patient   Methods Explanation;Demonstration   Comprehension Verbalized understanding;Returned demonstration             PT Long Term Goals - 02/18/17 1549      PT LONG TERM GOAL #1   Title Patient will be independent with HEP focused on improving ankle function to continue benefits of therapy after discharge.   Baseline dependent with form/technique; 01/18/17:Moderate cueing required for exercise technique; 02/18/2017: minimal cueing to perform   Time 6   Period Weeks   Status On-going     PT LONG TERM GOAL #2   Title Patient will be able to perform boot camp exercise program without pain to demonstrate significant improvement in ankle function.   Baseline unable; 01/18/17: attempted with increased pain; 02/18/2017: able to perform but requires use of ankle brace and avoids  ballistic movements   Time 6   Period Weeks   Status On-going     PT LONG TERM GOAL #3   Title Patient will improve LEFS to 64/80 to demonstrate significant improvement in foot/ankle function and greater ability to walk without pain.    Baseline 40/80; 01/18/17: 45/80 02/18/17: 48/80   Time 6   Period Weeks   Status On-going               Plan - 03/04/17 1165    Clinical Impression Statement Patient demonstrates improvement in ankle strength with exercises today with ability to perform exercises without increase in pain. Patient continues to have decreased dorsiflexion and increased aggravation in the ankle at the end of the day. Patient demonstrates decreased pain after performing dry  needling and patient will benefit from further skilled therapy to return to prior level of function.    Rehab Potential Good   Clinical Impairments Affecting Rehab Potential (+) highly motivated (-) pain aggrvation with weight bearing on LEs   PT Frequency 2x / week   PT Duration 6 weeks   PT Treatment/Interventions Cryotherapy;Electrical Stimulation;Iontophoresis 4mg /ml Dexamethasone;Moist Heat;Ultrasound;Therapeutic exercise;Therapeutic activities;Gait training;Balance training;Neuromuscular re-education;Patient/family education;Manual techniques;Dry needling;Passive range of motion   PT Next Visit Plan Progress strengthening on the L LE and hip; coordination of foot/ankle musculature   PT Home Exercise Plan isometrics plantarflexion/ inversion   Consulted and Agree with Plan of Care Patient      Patient will benefit from skilled therapeutic intervention in order to improve the following deficits and impairments:  Abnormal gait, Increased fascial restricitons, Pain, Decreased coordination, Decreased mobility, Decreased endurance, Decreased range of motion, Decreased strength, Difficulty walking, Impaired UE functional use  Visit Diagnosis: Stiffness of left ankle, not elsewhere classified  Muscle weakness (generalized)  Pain in left ankle and joints of left foot     Problem List Patient Active Problem List   Diagnosis Date Noted  . Abnormal blood chemistry 01/28/2015  . Allergic rhinitis 01/28/2015  . Anxiety 01/28/2015  . Chest pain 01/28/2015  . Family planning 01/28/2015  . Acid reflux 01/28/2015  . LBP (low back pain) 01/28/2015  . Plantar fasciitis 01/28/2015  . Borderline diabetes 01/28/2015  . Psoriasis 01/28/2015  . Avitaminosis D 01/28/2015  . Disease of white blood cells 11/22/2009  . Awareness of heartbeats 03/23/2008  . Adult hypothyroidism 03/14/2008  . History of tobacco use 11/30/2007  . Calculus of kidney 11/23/2007  . 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 Clinical depression 03/04/2001  . Extreme obesity 08/10/1998    Blythe Stanford, PT DPT 03/04/2017, 9:45 AM  [place] [facility] [address], [place], [address] Phone: [phone]   Fax:  [phone]  Name: Kelsey Randolph MRN: [id number] Date of Birth: [birth year]

## 2017-03-08 ENCOUNTER — Ambulatory Visit: Payer: 59

## 2017-03-11 ENCOUNTER — Ambulatory Visit: Payer: 59

## 2017-03-16 ENCOUNTER — Ambulatory Visit: Payer: 59 | Attending: Podiatry

## 2017-03-16 DIAGNOSIS — M25572 Pain in left ankle and joints of left foot: Secondary | ICD-10-CM | POA: Diagnosis not present

## 2017-03-16 DIAGNOSIS — M6281 Muscle weakness (generalized): Secondary | ICD-10-CM | POA: Diagnosis not present

## 2017-03-16 DIAGNOSIS — M25672 Stiffness of left ankle, not elsewhere classified: Secondary | ICD-10-CM | POA: Diagnosis not present

## 2017-03-16 NOTE — Therapy (Signed)
[facility] [address] [place], [address] Phone: [phone]   Fax:  [phone]  Physical Therapy Treatment  Patient Details  Name: Kelsey Randolph MRN: [id number] Date of Birth: 1976-08-08 Referring Provider: Samara Deist MD  Encounter Date: 03/16/2017      PT End of Session - 03/16/17 1703    Visit Number 25   Number of Visits 29   Date for PT Re-Evaluation 03/18/17   PT Start Time 1630   PT Stop Time 2353   PT Time Calculation (min) 45 min   Activity Tolerance Patient tolerated treatment well   Behavior During Therapy [facility] for tasks assessed/performed      Past Medical History:  Diagnosis Date  . Allergy   . Anxiety   . Depression   . GERD (gastroesophageal reflux disease)     Past Surgical History:  Procedure Laterality Date  . FOOT SURGERY Left   . kidney stones removed  2004  . tonsillectomy and adenoidectomy  1993    There were no vitals filed for this visit.      Subjective Assessment - 03/16/17 1658    Subjective Patient reports her tendon has significantly improved since the beginning of therapy.   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 38min   Diagnostic tests X-Ray   Patient Stated Goals Walk without pain   Currently in Pain? Yes   Pain Score 1    Pain Location Ankle   Pain Orientation Left   Pain Descriptors / Indicators Aching   Pain Type Chronic pain   Pain Onset More than a month ago   Pain Frequency Intermittent      TREATMENT:   Dry Needling: (5 min unbilled) One 132mm x .4 mm needle placed into the lower leg to directed towards the flexor digitorum longus and tibialis posterior muscle belly to decrease muscle spasms and dysfunction. (Patient given verbal consent before performing)    Therapeutic Exercise: Ambulation on the treadmill - 2.73mph 5 min Heel raises off of  step - 3 x 20  Arch lifts in standing in single leg stance - 3x20 Leaning against wall push off for tibialis posterior activation - 3 x 20  Single leg rotations in standing - x30    Patient demonstrates increased fatigue at end of the session       PT Education - 03/16/17 1703    Education provided Yes   Education Details form/technique with exercise   Person(s) Educated Patient   Methods Explanation;Demonstration   Comprehension Verbalized understanding;Returned demonstration             PT Long Term Goals - 02/18/17 1549      PT LONG TERM GOAL #1   Title Patient will be independent with HEP focused on improving ankle function to continue benefits of therapy after discharge.   Baseline dependent with form/technique; 01/18/17:Moderate cueing required for exercise technique; 02/18/2017: minimal cueing to perform   Time 6   Period Weeks   Status On-going     PT LONG TERM GOAL #2   Title Patient will be able to perform boot camp exercise program without pain to demonstrate significant improvement in ankle function.   Baseline unable; 01/18/17: attempted with increased pain; 02/18/2017: able to perform but requires use of ankle brace and avoids ballistic movements   Time 6   Period Weeks   Status On-going  PT LONG TERM GOAL #3   Title Patient will improve LEFS to 64/80 to demonstrate significant improvement in foot/ankle function and greater ability to walk without pain.    Baseline 40/80; 01/18/17: 45/80 02/18/17: 48/80   Time 6   Period Weeks   Status On-going               Plan - 03/16/17 1704    Clinical Impression Statement Patient demonstrates improvement with exercise performance with ability to perform greater amount of exercises  perform onset of fatigue. Patient continues to demonstrate a small amount of pain at rest but is overall improved. Patient to be discharged for L post tib pain next visit.    Rehab Potential Good   Clinical Impairments Affecting  Rehab Potential (+) highly motivated (-) pain aggrvation with weight bearing on LEs   PT Frequency 2x / week   PT Duration 6 weeks   PT Treatment/Interventions Cryotherapy;Electrical Stimulation;Iontophoresis 4mg /ml Dexamethasone;Moist Heat;Ultrasound;Therapeutic exercise;Therapeutic activities;Gait training;Balance training;Neuromuscular re-education;Patient/family education;Manual techniques;Dry needling;Passive range of motion   PT Next Visit Plan Progress strengthening on the L LE and hip; coordination of foot/ankle musculature   PT Home Exercise Plan isometrics plantarflexion/ inversion   Consulted and Agree with Plan of Care Patient      Patient will benefit from skilled therapeutic intervention in order to improve the following deficits and impairments:  Abnormal gait, Increased fascial restricitons, Pain, Decreased coordination, Decreased mobility, Decreased endurance, Decreased range of motion, Decreased strength, Difficulty walking, Impaired UE functional use  Visit Diagnosis: Stiffness of left ankle, not elsewhere classified  Muscle weakness (generalized)  Pain in left ankle and joints of left foot     Problem List Patient Active Problem List   Diagnosis Date Noted  . Abnormal blood chemistry 01/28/2015  . Allergic rhinitis 01/28/2015  . Anxiety 01/28/2015  . Chest pain 01/28/2015  . Family planning 01/28/2015  . Acid reflux 01/28/2015  . LBP (low back pain) 01/28/2015  . Plantar fasciitis 01/28/2015  . Borderline diabetes 01/28/2015  . Psoriasis 01/28/2015  . Avitaminosis D 01/28/2015  . Disease of white blood cells 11/22/2009  . Awareness of heartbeats 03/23/2008  . Adult hypothyroidism 03/14/2008  . History of tobacco use 11/30/2007  . Calculus of kidney 11/23/2007  . 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 Clinical depression 03/04/2001  . Extreme obesity 08/10/1998    Blythe Stanford, PT DPT 03/16/2017, 5:16 PM  [facility] [address], [place], [address] Phone: [phone]   Fax:  [phone]  Name: ZANYLA KLEBBA MRN: [id number] Date of Birth: 10-Aug-1976

## 2017-03-18 ENCOUNTER — Other Ambulatory Visit: Payer: Self-pay | Admitting: Podiatry

## 2017-03-18 ENCOUNTER — Ambulatory Visit: Payer: 59

## 2017-03-18 DIAGNOSIS — S96912A Strain of unspecified muscle and tendon at ankle and foot level, left foot, initial encounter: Secondary | ICD-10-CM | POA: Diagnosis not present

## 2017-03-18 DIAGNOSIS — M76822 Posterior tibial tendinitis, left leg: Secondary | ICD-10-CM | POA: Diagnosis not present

## 2017-03-19 ENCOUNTER — Other Ambulatory Visit: Payer: Self-pay | Admitting: Physician Assistant

## 2017-03-19 MED ORDER — ESCITALOPRAM OXALATE 20 MG PO TABS: 20.0000 mg | ORAL_TABLET | Freq: Every day | ORAL | 3 refills | Status: DC

## 2017-03-19 NOTE — Telephone Encounter (Signed)
Please review. Thanks!  

## 2017-03-19 NOTE — Telephone Encounter (Signed)
[facility] faxed a request on the following medication. Thanks CC  escitalopram (LEXAPRO) 20 MG tablet  Take 1 tablet ( 20 MG total ) by mouth daily.

## 2017-04-05 ENCOUNTER — Other Ambulatory Visit: Payer: Self-pay | Admitting: Pharmacist

## 2017-04-05 MED ORDER — ADALIMUMAB 40 MG/0.8ML ~~LOC~~ PSKT: 0.8000 mL | PREFILLED_SYRINGE | SUBCUTANEOUS | 2 refills | Status: DC

## 2017-04-06 ENCOUNTER — Other Ambulatory Visit: Payer: Self-pay | Admitting: Pharmacist

## 2017-04-06 MED ORDER — ADALIMUMAB 40 MG/0.8ML ~~LOC~~ AJKT: 0.8000 mL | AUTO-INJECTOR | SUBCUTANEOUS | 2 refills | Status: DC

## 2017-04-06 MED FILL — HUMIRA PEN 40 MG/0.8ML PNKT: 40 | 28 days supply | Qty: 2 | Fill #0

## 2017-04-09 ENCOUNTER — Encounter
Admission: RE | Admit: 2017-04-09 | Discharge: 2017-04-09 | Disposition: A | Discharge status: Home / Self Care | Payer: 59 | Source: Ambulatory Visit | Attending: Podiatry | Admitting: Podiatry

## 2017-04-09 HISTORY — DX: Nausea with vomiting, unspecified: R11.2

## 2017-04-09 HISTORY — DX: Unspecified osteoarthritis, unspecified site: M19.90

## 2017-04-09 HISTORY — DX: Other specified postprocedural states: Z98.890

## 2017-04-09 HISTORY — DX: Hypothyroidism, unspecified: E03.9

## 2017-04-09 HISTORY — DX: Personal history of urinary calculi: Z87.442

## 2017-04-09 NOTE — Patient Instructions (Signed)
  Your procedure is scheduled on: 04-16-17 FRIDAY Report to Same Day Surgery 2nd floor medical mall [facility] Entrance-take elevator on left to 2nd floor.  Check in with surgery information desk.) To find out your arrival time please call [phone] between 1PM - 3PM on 04-15-17 THURSDAY  Remember: Instructions that are not followed completely may result in serious medical risk, up to and including death, or upon the discretion of your surgeon and anesthesiologist your surgery may need to be rescheduled.    _x___ 1. Do not eat food after midnight the night before your procedure. NO GUM CHEWING OR HARD CANDIES. You may drink clear liquids up to 2 hours before you are scheduled to arrive at the hospital for your procedure.  Do not drink clear liquids within 2 hours of your scheduled arrival to the hospital.  Clear liquids include  --Water or Apple juice without pulp  --Clear carbohydrate beverage such as ClearFast or Gatorade  --Black Coffee or Clear Tea (No milk, no creamers, do not add anything to the coffee or Tea)  Type 1 and type 2 diabetics should only drink water.     __x__ 2. No Alcohol for 24 hours before or after surgery.   __x__3. No Smoking for 24 prior to surgery.   ____  4. Bring all medications with you on the day of surgery if instructed.    __x__ 5. Notify your doctor if there is any change in your medical condition     (cold, fever, infections).     Do not wear jewelry, make-up, hairpins, clips or nail polish.  Do not wear lotions, powders, or perfumes. You may wear deodorant.  Do not shave 48 hours prior to surgery. Men may shave face and neck.  Do not bring valuables to the hospital.    [facility] is not responsible for any belongings or valuables.               Contacts, dentures or bridgework may not be worn into surgery.  Leave your suitcase in the car. After surgery it may be brought to your room.  For patients admitted to the hospital, discharge time is  determined by your treatment team.   Patients discharged the day of surgery will not be allowed to drive home.  You will need someone to drive you home and stay with you the night of your procedure.    Please read over the following fact sheets that you were given:     _x___ [place] WITH A SMALL SIP OF WATER. These include:  1. LEVOTHYROXINE  2. LEXAPRO  3. XANAX  4.  5.  6.  ____Fleets enema or Magnesium Citrate as directed.   _x___ Use CHG Soap or sage wipes as directed on instruction sheet   ____ Use inhalers on the day of surgery and bring to hospital day of surgery  ____ Stop Metformin and Janumet 2 days prior to surgery.    ____ Take 1/2 of usual insulin dose the night before surgery and none on the morning  surgery.   ____ Follow recommendations from Cardiologist, Pulmonologist or PCP regarding stopping Aspirin, Coumadin, Plavix ,Eliquis, Effient, or Pradaxa, and Pletal.  X____Stop Anti-inflammatories such as Advil, Aleve, Ibuprofen, Motrin, Naproxen, Naprosyn, Goodies powders or aspirin products NOW-OK to take Tylenol    ____ Stop supplements until after surgery.     ____ Bring C-Pap to the hospital.

## 2017-04-13 ENCOUNTER — Encounter
Admission: RE | Admit: 2017-04-13 | Discharge: 2017-04-13 | Disposition: A | Discharge status: Home / Self Care | Payer: 59 | Source: Ambulatory Visit | Attending: Podiatry | Admitting: Podiatry

## 2017-04-13 DIAGNOSIS — Z79899 Other long term (current) drug therapy: Secondary | ICD-10-CM | POA: Diagnosis not present

## 2017-04-13 DIAGNOSIS — F418 Other specified anxiety disorders: Secondary | ICD-10-CM | POA: Diagnosis not present

## 2017-04-13 DIAGNOSIS — Z87891 Personal history of nicotine dependence: Secondary | ICD-10-CM | POA: Diagnosis not present

## 2017-04-13 DIAGNOSIS — M76822 Posterior tibial tendinitis, left leg: Secondary | ICD-10-CM | POA: Diagnosis not present

## 2017-04-13 DIAGNOSIS — E039 Hypothyroidism, unspecified: Secondary | ICD-10-CM | POA: Diagnosis not present

## 2017-04-13 DIAGNOSIS — G5752 Tarsal tunnel syndrome, left lower limb: Secondary | ICD-10-CM | POA: Diagnosis not present

## 2017-04-13 DIAGNOSIS — K219 Gastro-esophageal reflux disease without esophagitis: Secondary | ICD-10-CM | POA: Diagnosis not present

## 2017-04-13 LAB — BASIC METABOLIC PANEL
ANION GAP: 9 (ref 5–15)
BUN: 9 mg/dL (ref 6–20)
CHLORIDE: 102 mmol/L (ref 101–111)
CO2: 25 mmol/L (ref 22–32)
Calcium: 8.4 mg/dL — ABNORMAL LOW (ref 8.9–10.3)
Creatinine, Ser: 0.52 mg/dL (ref 0.44–1.00)
GFR calc non Af Amer: >60 mL/min (ref >60)
Glucose, Bld: 79 mg/dL (ref 65–99)
Potassium: 3.6 mmol/L (ref 3.5–5.1)
Sodium: 136 mmol/L (ref 135–145)

## 2017-04-15 MED ORDER — CEFAZOLIN SODIUM-DEXTROSE 2-4 GM/100ML-% IV SOLN
2.0000 g | INTRAVENOUS | Status: AC
  Administered 2017-04-16: 2 g via INTRAVENOUS

## 2017-04-16 ENCOUNTER — Ambulatory Visit
Admission: RE | Admit: 2017-04-16 | Discharge: 2017-04-16 | Disposition: A | Discharge status: Home / Self Care | Payer: 59 | Source: Ambulatory Visit | Attending: Podiatry | Admitting: Podiatry

## 2017-04-16 ENCOUNTER — Encounter
Admission: RE | Disposition: A | Discharge status: Home / Self Care | Payer: Self-pay | Source: Ambulatory Visit | Attending: Podiatry

## 2017-04-16 ENCOUNTER — Ambulatory Visit: Payer: 59 | Admitting: Anesthesiology

## 2017-04-16 DIAGNOSIS — M76822 Posterior tibial tendinitis, left leg: Secondary | ICD-10-CM | POA: Diagnosis not present

## 2017-04-16 DIAGNOSIS — F418 Other specified anxiety disorders: Secondary | ICD-10-CM | POA: Diagnosis not present

## 2017-04-16 DIAGNOSIS — F329 Major depressive disorder, single episode, unspecified: Secondary | ICD-10-CM | POA: Diagnosis not present

## 2017-04-16 DIAGNOSIS — G5752 Tarsal tunnel syndrome, left lower limb: Secondary | ICD-10-CM | POA: Diagnosis not present

## 2017-04-16 DIAGNOSIS — Z79899 Other long term (current) drug therapy: Secondary | ICD-10-CM | POA: Insufficient documentation

## 2017-04-16 DIAGNOSIS — S93412A Sprain of calcaneofibular ligament of left ankle, initial encounter: Secondary | ICD-10-CM | POA: Diagnosis not present

## 2017-04-16 DIAGNOSIS — E039 Hypothyroidism, unspecified: Secondary | ICD-10-CM | POA: Diagnosis not present

## 2017-04-16 DIAGNOSIS — F419 Anxiety disorder, unspecified: Secondary | ICD-10-CM | POA: Diagnosis not present

## 2017-04-16 DIAGNOSIS — K219 Gastro-esophageal reflux disease without esophagitis: Secondary | ICD-10-CM | POA: Diagnosis not present

## 2017-04-16 DIAGNOSIS — Z87891 Personal history of nicotine dependence: Secondary | ICD-10-CM | POA: Insufficient documentation

## 2017-04-16 DIAGNOSIS — S86809A Unspecified injury of other muscle(s) and tendon(s) at lower leg level, unspecified leg, initial encounter: Secondary | ICD-10-CM | POA: Diagnosis not present

## 2017-04-16 HISTORY — PX: TENDON REPAIR: SHX5111

## 2017-04-16 HISTORY — PX: TARSAL TUNNEL RELEASE: SHX5042

## 2017-04-16 LAB — POCT PREGNANCY, URINE: Preg Test, Ur: NEGATIVE

## 2017-04-16 SURGERY — RELEASE, TARSAL TUNNEL
Anesthesia: General | Laterality: Left

## 2017-04-16 MED ORDER — FENTANYL CITRATE (PF) 100 MCG/2ML IJ SOLN
INTRAMUSCULAR | Status: AC
  Filled 2017-04-16: qty 2

## 2017-04-16 MED ORDER — DEXAMETHASONE SODIUM PHOSPHATE 10 MG/ML IJ SOLN
INTRAMUSCULAR | Status: DC | PRN
  Administered 2017-04-16: 10 mg via INTRAVENOUS

## 2017-04-16 MED ORDER — EPHEDRINE SULFATE 50 MG/ML IJ SOLN
INTRAMUSCULAR | Status: DC | PRN
  Administered 2017-04-16 (×5): 10 mg via INTRAVENOUS

## 2017-04-16 MED ORDER — ONDANSETRON HCL 4 MG/2ML IJ SOLN
INTRAMUSCULAR | Status: AC
  Filled 2017-04-16: qty 2

## 2017-04-16 MED ORDER — NEOMYCIN-POLYMYXIN B GU 40-200000 IR SOLN
Status: DC | PRN
  Administered 2017-04-16: 2 mL

## 2017-04-16 MED ORDER — FAMOTIDINE 20 MG PO TABS
20.0000 mg | ORAL_TABLET | Freq: Once | ORAL | Status: AC
  Administered 2017-04-16: 20 mg via ORAL

## 2017-04-16 MED ORDER — EPINEPHRINE PF 1 MG/ML IJ SOLN
INTRAMUSCULAR | Status: AC
  Filled 2017-04-16: qty 1

## 2017-04-16 MED ORDER — FENTANYL CITRATE (PF) 100 MCG/2ML IJ SOLN
50.0000 ug | Freq: Once | INTRAMUSCULAR | Status: AC
  Administered 2017-04-16: 50 ug via INTRAVENOUS

## 2017-04-16 MED ORDER — FENTANYL CITRATE (PF) 100 MCG/2ML IJ SOLN: 25.0000 ug | INTRAMUSCULAR | Status: DC | PRN

## 2017-04-16 MED ORDER — ROPIVACAINE HCL 5 MG/ML IJ SOLN
INTRAMUSCULAR | Status: AC
  Filled 2017-04-16: qty 30

## 2017-04-16 MED ORDER — PHENYLEPHRINE HCL 10 MG/ML IJ SOLN
INTRAMUSCULAR | Status: DC | PRN
  Administered 2017-04-16 (×2): 100 ug via INTRAVENOUS
  Administered 2017-04-16: 200 ug via INTRAVENOUS
  Administered 2017-04-16 (×8): 100 ug via INTRAVENOUS

## 2017-04-16 MED ORDER — BUPIVACAINE HCL (PF) 0.25 % IJ SOLN
INTRAMUSCULAR | Status: AC
  Filled 2017-04-16: qty 30

## 2017-04-16 MED ORDER — SUCCINYLCHOLINE CHLORIDE 20 MG/ML IJ SOLN
INTRAMUSCULAR | Status: AC
  Filled 2017-04-16: qty 1

## 2017-04-16 MED ORDER — PHENYLEPHRINE HCL 10 MG/ML IJ SOLN
INTRAMUSCULAR | Status: AC
  Filled 2017-04-16: qty 1

## 2017-04-16 MED ORDER — CEFAZOLIN SODIUM-DEXTROSE 2-4 GM/100ML-% IV SOLN
INTRAVENOUS | Status: AC
  Filled 2017-04-16: qty 100

## 2017-04-16 MED ORDER — LACTATED RINGERS IV SOLN
INTRAVENOUS | Status: DC
  Administered 2017-04-16: 07:00:00 via INTRAVENOUS

## 2017-04-16 MED ORDER — BUPIVACAINE-EPINEPHRINE (PF) 0.25% -1:200000 IJ SOLN
INTRAMUSCULAR | Status: DC | PRN
  Administered 2017-04-16: 8 mL

## 2017-04-16 MED ORDER — LIDOCAINE-EPINEPHRINE (PF) 1 %-1:200000 IJ SOLN
INTRAMUSCULAR | Status: AC
  Filled 2017-04-16: qty 30

## 2017-04-16 MED ORDER — LIDOCAINE HCL (PF) 2 % IJ SOLN
INTRAMUSCULAR | Status: AC
  Filled 2017-04-16: qty 8

## 2017-04-16 MED ORDER — MIDAZOLAM HCL 2 MG/2ML IJ SOLN
INTRAMUSCULAR | Status: DC | PRN
  Administered 2017-04-16: 2 mg via INTRAVENOUS

## 2017-04-16 MED ORDER — FAMOTIDINE 20 MG PO TABS
ORAL_TABLET | ORAL | Status: AC
  Administered 2017-04-16: 20 mg via ORAL
  Filled 2017-04-16: qty 1

## 2017-04-16 MED ORDER — ACETAMINOPHEN 10 MG/ML IV SOLN
INTRAVENOUS | Status: AC
  Filled 2017-04-16: qty 100

## 2017-04-16 MED ORDER — ONDANSETRON HCL 4 MG/2ML IJ SOLN
4.0000 mg | Freq: Once | INTRAMUSCULAR | Status: AC | PRN
  Administered 2017-04-16: 4 mg via INTRAVENOUS

## 2017-04-16 MED ORDER — CEFAZOLIN SODIUM-DEXTROSE 1-4 GM/50ML-% IV SOLN
INTRAVENOUS | Status: DC | PRN
  Administered 2017-04-16: 1 g via INTRAVENOUS

## 2017-04-16 MED ORDER — BUPIVACAINE HCL (PF) 0.25 % IJ SOLN
INTRAMUSCULAR | Status: DC | PRN
  Administered 2017-04-16: 10 mL

## 2017-04-16 MED ORDER — GLYCOPYRROLATE 0.2 MG/ML IJ SOLN
INTRAMUSCULAR | Status: AC
  Filled 2017-04-16: qty 2

## 2017-04-16 MED ORDER — MIDAZOLAM HCL 2 MG/2ML IJ SOLN
INTRAMUSCULAR | Status: AC
  Administered 2017-04-16: 1 mg
  Filled 2017-04-16: qty 2

## 2017-04-16 MED ORDER — OXYCODONE-ACETAMINOPHEN 5-325 MG PO TABS: 1.0000 | ORAL_TABLET | ORAL | 0 refills | Status: DC | PRN

## 2017-04-16 MED ORDER — SCOPOLAMINE 1 MG/3DAYS TD PT72
MEDICATED_PATCH | TRANSDERMAL | Status: AC
  Administered 2017-04-16: 1.5 mg via TRANSDERMAL
  Filled 2017-04-16: qty 1

## 2017-04-16 MED ORDER — NEOMYCIN-POLYMYXIN B GU 40-200000 IR SOLN
Status: AC
  Filled 2017-04-16: qty 2

## 2017-04-16 MED ORDER — FENTANYL CITRATE (PF) 100 MCG/2ML IJ SOLN
INTRAMUSCULAR | Status: AC
  Administered 2017-04-16: 50 ug via INTRAVENOUS
  Filled 2017-04-16: qty 2

## 2017-04-16 MED ORDER — SCOPOLAMINE 1 MG/3DAYS TD PT72
1.0000 | MEDICATED_PATCH | Freq: Once | TRANSDERMAL | Status: DC
Start: 2017-04-16 — End: 2017-04-16
  Administered 2017-04-16: 1.5 mg via TRANSDERMAL

## 2017-04-16 MED ORDER — CEFAZOLIN SODIUM 1 G IJ SOLR
INTRAMUSCULAR | Status: AC
  Filled 2017-04-16: qty 10

## 2017-04-16 MED ORDER — MIDAZOLAM HCL 2 MG/2ML IJ SOLN: 2.0000 mg | Freq: Once | INTRAMUSCULAR | Status: DC

## 2017-04-16 MED ORDER — ACETAMINOPHEN 10 MG/ML IV SOLN
INTRAVENOUS | Status: DC | PRN
  Administered 2017-04-16: 1000 mg via INTRAVENOUS

## 2017-04-16 MED ORDER — DEXAMETHASONE SODIUM PHOSPHATE 10 MG/ML IJ SOLN
INTRAMUSCULAR | Status: AC
  Filled 2017-04-16: qty 1

## 2017-04-16 MED ORDER — MIDAZOLAM HCL 2 MG/2ML IJ SOLN
INTRAMUSCULAR | Status: AC
  Filled 2017-04-16: qty 2

## 2017-04-16 MED ORDER — BUPIVACAINE LIPOSOME 1.3 % IJ SUSP
INTRAMUSCULAR | Status: DC | PRN
  Administered 2017-04-16: 10 mL

## 2017-04-16 MED ORDER — SODIUM CHLORIDE FLUSH 0.9 % IV SOLN
INTRAVENOUS | Status: AC
  Filled 2017-04-16: qty 10

## 2017-04-16 MED ORDER — POVIDONE-IODINE 7.5 % EX SOLN: Freq: Once | CUTANEOUS | Status: DC

## 2017-04-16 MED ORDER — PROPOFOL 10 MG/ML IV BOLUS
INTRAVENOUS | Status: AC
  Filled 2017-04-16: qty 40

## 2017-04-16 MED ORDER — SEVOFLURANE IN SOLN
RESPIRATORY_TRACT | Status: AC
  Filled 2017-04-16: qty 250

## 2017-04-16 MED ORDER — EPHEDRINE SULFATE 50 MG/ML IJ SOLN
INTRAMUSCULAR | Status: AC
  Filled 2017-04-16: qty 1

## 2017-04-16 MED ORDER — LIDOCAINE HCL (PF) 1 % IJ SOLN
INTRAMUSCULAR | Status: AC
  Filled 2017-04-16: qty 30

## 2017-04-16 MED ORDER — ONDANSETRON HCL 4 MG/2ML IJ SOLN
INTRAMUSCULAR | Status: DC | PRN
  Administered 2017-04-16: 4 mg via INTRAVENOUS

## 2017-04-16 MED ORDER — BUPIVACAINE LIPOSOME 1.3 % IJ SUSP
INTRAMUSCULAR | Status: AC
  Filled 2017-04-16: qty 20

## 2017-04-16 MED ORDER — FENTANYL CITRATE (PF) 100 MCG/2ML IJ SOLN
INTRAMUSCULAR | Status: DC | PRN
  Administered 2017-04-16 (×2): 50 ug via INTRAVENOUS
  Administered 2017-04-16 (×2): 25 ug via INTRAVENOUS

## 2017-04-16 MED ORDER — LIDOCAINE HCL (CARDIAC) 20 MG/ML IV SOLN
INTRAVENOUS | Status: DC | PRN
  Administered 2017-04-16: 100 mg via INTRAVENOUS

## 2017-04-16 MED ORDER — BUPIVACAINE HCL (PF) 0.5 % IJ SOLN
INTRAMUSCULAR | Status: AC
  Filled 2017-04-16: qty 30

## 2017-04-16 MED ORDER — PROPOFOL 10 MG/ML IV BOLUS
INTRAVENOUS | Status: DC | PRN
  Administered 2017-04-16 (×2): 100 mg via INTRAVENOUS

## 2017-04-16 MED ORDER — LIDOCAINE HCL (PF) 2 % IJ SOLN
INTRAMUSCULAR | Status: AC
  Filled 2017-04-16: qty 2

## 2017-04-16 MED ORDER — GLYCOPYRROLATE 0.2 MG/ML IJ SOLN
INTRAMUSCULAR | Status: DC | PRN
  Administered 2017-04-16: 0.2 mg via INTRAVENOUS

## 2017-04-16 SURGICAL SUPPLY — 58 items
BANDAGE ELASTIC 4 LF NS (GAUZE/BANDAGES/DRESSINGS) ×2 IMPLANT
BANDAGE STRETCH 3X4.1 STRL (GAUZE/BANDAGES/DRESSINGS) ×2 IMPLANT
BLADE SURG 15 STRL LF DISP TIS (BLADE) ×2 IMPLANT
BLADE SURG 15 STRL SS (BLADE) ×2
BLADE SURG MINI STRL (BLADE) ×2 IMPLANT
BNDG ESMARK 4X12 TAN STRL LF (GAUZE/BANDAGES/DRESSINGS) IMPLANT
BNDG GAUZE 4.5X4.1 6PLY STRL (MISCELLANEOUS) ×2 IMPLANT
CANISTER SUCT 1200ML W/VALVE (MISCELLANEOUS) ×2 IMPLANT
CUFF TOURN 34 STER (MISCELLANEOUS) ×2 IMPLANT
DRAPE FLUOR MINI C-ARM 54X84 (DRAPES) IMPLANT
DURAPREP 26ML APPLICATOR (WOUND CARE) ×2 IMPLANT
ELECT REM PT RETURN 9FT ADLT (ELECTROSURGICAL) ×2
ELECTRODE REM PT RTRN 9FT ADLT (ELECTROSURGICAL) ×1 IMPLANT
GAUZE PETRO XEROFOAM 1X8 (MISCELLANEOUS) ×2 IMPLANT
GAUZE SPONGE 4X4 12PLY STRL (GAUZE/BANDAGES/DRESSINGS) ×2 IMPLANT
GAUZE STRETCH 2X75IN STRL (MISCELLANEOUS) ×2 IMPLANT
GLOVE BIO SURGEON STRL SZ7.5 (GLOVE) ×2 IMPLANT
GLOVE INDICATOR 8.0 STRL GRN (GLOVE) ×2 IMPLANT
GOWN STRL REUS W/ TWL LRG LVL3 (GOWN DISPOSABLE) ×2 IMPLANT
GOWN STRL REUS W/TWL LRG LVL3 (GOWN DISPOSABLE) ×2
HANDLE YANKAUER SUCT BULB TIP (MISCELLANEOUS) ×2 IMPLANT
KIT RM TURNOVER STRD PROC AR (KITS) ×2 IMPLANT
KIT SUTURE 1.8 Q-FIX DISP (KITS) ×2 IMPLANT
LABEL OR SOLS (LABEL) IMPLANT
NDL HPO THNWL 1X22GA REG BVL (NEEDLE) ×1 IMPLANT
NDL MAYO CATGUT SZ5 (NEEDLE)
NDL SUT 5 .5 CRC TPR PNT MAYO (NEEDLE) IMPLANT
NEEDLE FILTER BLUNT 18X 1/2SAF (NEEDLE) ×1
NEEDLE FILTER BLUNT 18X1 1/2 (NEEDLE) ×1 IMPLANT
NEEDLE HYPO 25X1 1.5 SAFETY (NEEDLE) ×2 IMPLANT
NEEDLE SAFETY 22GX1 (NEEDLE) ×1
NS IRRIG 500ML POUR BTL (IV SOLUTION) ×2 IMPLANT
PACK EXTREMITY ARMC (MISCELLANEOUS) ×2 IMPLANT
PAD CAST CTTN 4X4 STRL (SOFTGOODS) IMPLANT
PADDING CAST COTTON 4X4 STRL (SOFTGOODS)
RASP SM TEAR CROSS CUT (RASP) IMPLANT
SOL PREP PVP 2OZ (MISCELLANEOUS)
SOLUTION PREP PVP 2OZ (MISCELLANEOUS) IMPLANT
SPLINT CAST 1 STEP 5X30 WHT (MISCELLANEOUS) IMPLANT
SPLINT FAST PLASTER 5X30 (CAST SUPPLIES)
SPLINT PLASTER CAST FAST 5X30 (CAST SUPPLIES) IMPLANT
SPONGE LAP 18X18 5 PK (GAUZE/BANDAGES/DRESSINGS) IMPLANT
STOCKINETTE M/LG 89821 (MISCELLANEOUS) ×2 IMPLANT
STRIP CLOSURE SKIN 1/2X4 (GAUZE/BANDAGES/DRESSINGS) ×2 IMPLANT
SUT MNCRL+ 5-0 VIOLET P-3 (SUTURE) ×1 IMPLANT
SUT MONOCRYL 5-0 (SUTURE) ×1
SUT VIC AB 0 SH 27 (SUTURE) IMPLANT
SUT VIC AB 2-0 SH 27 (SUTURE)
SUT VIC AB 2-0 SH 27XBRD (SUTURE) IMPLANT
SUT VIC AB 3-0 SH 27 (SUTURE) ×1
SUT VIC AB 3-0 SH 27X BRD (SUTURE) ×1 IMPLANT
SUT VIC AB 4-0 FS2 27 (SUTURE) ×2 IMPLANT
SUT VICRYL AB 3-0 FS1 BRD 27IN (SUTURE) IMPLANT
SWABSTK COMLB BENZOIN TINCTURE (MISCELLANEOUS) IMPLANT
SYR 3ML LL SCALE MARK (SYRINGE) ×2 IMPLANT
SYRINGE 10CC LL (SYRINGE) ×4 IMPLANT
WAND TOPAZ EPF  WAS Q (MISCELLANEOUS) ×1
WAND TOPAZ EPF WAS Q (MISCELLANEOUS) ×1 IMPLANT

## 2017-04-16 NOTE — Anesthesia Postprocedure Evaluation (Signed)
Anesthesia Post Note  Patient: Kelsey Randolph  Procedure(s) Performed: Procedure(s) (LRB): TARSAL TUNNEL RELEASE (Left) FLEXOR TENDON REPAIR-SECONDARY  (Left)  Patient location during evaluation: PACU Anesthesia Type: General Level of consciousness: awake and alert Pain management: pain level controlled Vital Signs Assessment: post-procedure vital signs reviewed and stable Respiratory status: spontaneous breathing, nonlabored ventilation, respiratory function stable and patient connected to nasal cannula oxygen Cardiovascular status: blood pressure returned to baseline and stable Postop Assessment: no signs of nausea or vomiting Anesthetic complications: no     Last Vitals:  Vitals:   04/16/17 1022 04/16/17 1047  BP: 133/75 (!) 131/55  Pulse: (!) 109 (!) 107  Resp: 16   Temp: (!) 35.9 C   SpO2: 96% 100%    Last Pain:  Vitals:   04/16/17 1047  TempSrc:   PainSc: [place]

## 2017-04-16 NOTE — H&P (Signed)
HISTORY AND PHYSICAL INTERVAL NOTE:  04/16/2017  7:12 AM  Kelsey Randolph  has presented today for surgery, with the diagnosis of Posterior tibial tendiitis.  The various methods of treatment have been discussed with the patient.  No guarantees were given.  After consideration of risks, benefits and other options for treatment, the patient has consented to surgery.  I have reviewed the patients' chart and labs.    Patient Vitals for the past 24 hrs:  BP Temp Temp src Pulse Resp SpO2 Weight  04/16/17 0703 (!) 145/98 - - (!) 117 20 99 % -  04/16/17 0658 140/66 - - (!) 123 (!) 25 99 % -  04/16/17 0653 138/90 - - (!) 110 (!) 22 99 % -  04/16/17 0650 - 97.7 F (36.5 C) - (!) 112 16 99 % -  04/16/17 0620 (!) 151/78 97.6 F (36.4 C) Tympanic (!) 110 18 95 % -  04/16/17 0618 - - - - - - (!) 146.5 kg (323 lb)    A history and physical examination was performed in my office.  The patient was reexamined.  There have been no changes to this history and physical examination.  Samara Deist A

## 2017-04-16 NOTE — Progress Notes (Signed)
Capillary refill positive to left foot   Warm and dry

## 2017-04-16 NOTE — Anesthesia Procedure Notes (Signed)
Procedure Name: LMA Insertion Date/Time: 04/16/2017 7:33 AM Performed by: Doreen Salvage Pre-anesthesia Checklist: Patient identified, Patient being monitored, Timeout performed, Emergency Drugs available and Suction available Patient Re-evaluated:Patient Re-evaluated prior to induction Oxygen Delivery Method: Circle system utilized Preoxygenation: Pre-oxygenation with 100% oxygen Induction Type: IV induction Ventilation: Mask ventilation without difficulty LMA: LMA inserted LMA Size: 5.0 Tube type: Oral Number of attempts: 1 Placement Confirmation: positive ETCO2 and breath sounds checked- equal and bilateral Tube secured with: Tape Dental Injury: Teeth and Oropharynx as per pre-operative assessment  Comments: Inserted per D. Marcello Moores

## 2017-04-16 NOTE — Transfer of Care (Signed)
  Anesthesia Post-op Note  Patient: Kelsey Randolph  Procedure(s) Performed: Procedure(s): TARSAL TUNNEL RELEASE (Left) FLEXOR TENDON REPAIR-SECONDARY  (Left)  Patient Location: Mother/Baby  Anesthesia Type:Epidural  Level of Consciousness: awake, alert  and oriented  Airway and Oxygen Therapy: Patient Spontanous Breathing  Post-op Pain: 0 /10  Post-op Assessment: Post-op Vital signs reviewed, Patent Airway, No signs of Nausea or vomiting and Pain level controlled  Post-op Vital Signs: Reviewed and stable  Last Vitals:  Vitals:   04/16/17 0937 04/16/17 0939  BP:  (!) 131/47  Pulse:  (!) 128  Resp:  (!) 21  Temp: (!) (P) 36.3 C (!) 36.3 C  SpO2:  44%    Complications: No apparent anesthesia complications

## 2017-04-16 NOTE — Progress Notes (Signed)
Left foot elevated on pillows.

## 2017-04-16 NOTE — Progress Notes (Signed)
Post op shoe in place 

## 2017-04-16 NOTE — Discharge Instructions (Signed)
[facility] °[facility] ° °POST OPERATIVE INSTRUCTIONS FOR DR. TROXLER AND DR. Necole Minassian °[facility] ° ° °1. Take your medication as prescribed.  Pain medication should be taken only as needed. ° °2. Keep the dressing clean, dry and intact. ° °3. Keep your foot elevated above the heart level for the first 48 hours. ° °4. Walking to the bathroom and brief periods of walking are acceptable, unless we have instructed you to be non-weight bearing. ° °5. Always wear your post-op shoe when walking.  Always use your crutches if you are to be non-weight bearing. ° °6. Do not take a shower. Baths are permissible as long as the foot is kept out of the water.  ° °7. Every hour you are awake:  °- Bend your knee 15 times. °- Flex foot 15 times °- Massage calf 15 times ° °8. Call [facility] ([phone]) if any of the following problems occur: °- You develop a temperature or fever. °- The bandage becomes saturated with blood. °- Medication does not stop your pain. °- Injury of the foot occurs. °- Any symptoms of infection including redness, odor, or red streaks running from wound. °-  ° °

## 2017-04-16 NOTE — Anesthesia Preprocedure Evaluation (Signed)
Anesthesia Evaluation  Patient identified by MRN, date of birth, ID band Patient awake    Reviewed: Allergy & Precautions, NPO status , Patient's Chart, lab work & pertinent test results, reviewed documented beta blocker date and time   History of Anesthesia Complications (+) PONV and history of anesthetic complications  Airway Mallampati: III  TM Distance: >3 FB     Dental  (+) Chipped   Pulmonary former smoker,           Cardiovascular      Neuro/Psych PSYCHIATRIC DISORDERS Anxiety Depression    GI/Hepatic GERD  Controlled,  Endo/Other  Hypothyroidism   Renal/GU Renal disease     Musculoskeletal  (+) Arthritis ,   Abdominal   Peds  Hematology   Anesthesia Other Findings   Reproductive/Obstetrics                             Anesthesia Physical Anesthesia Plan  ASA: III  Anesthesia Plan: General   Post-op Pain Management:    Induction: Intravenous  PONV Risk Score and Plan:   Airway Management Planned: LMA  Additional Equipment:   Intra-op Plan:   Post-operative Plan:   Informed Consent: I have reviewed the patients History and Physical, chart, labs and discussed the procedure including the risks, benefits and alternatives for the proposed anesthesia with the patient or authorized representative who has indicated his/her understanding and acceptance.     Plan Discussed with: CRNA  Anesthesia Plan Comments:         Anesthesia Quick Evaluation

## 2017-04-16 NOTE — Op Note (Signed)
Operative note   Surgeon:Carolynn Tuley Lawyer: None    Preop diagnosis: 1 left posterior tibial tendinitis 2. Left spring ligament sprain 3. Tarsal tunnel syndrome left ankle    Postop diagnosis: Same    Procedure: 1 open repair of posterior tibial tendinitis 2. Debridement and repair spring ligament left medial foot and ankle 3. Tarsal tunnel release left medial ankle    EBL: Minimal    Anesthesia:local and general    Hemostasis: Epinephrine infiltrated along the incision site at 1-200,000    Specimen: Posterior tibial tendinitis    Complications: None    Operative indications:Kelsey Randolph is an 41 y.o. that presents today for surgical intervention.  The risks/benefits/alternatives/complications have been discussed and consent has been given.    Procedure:  Patient was brought into the OR and placed on the operating table in thesupine position. After anesthesia was obtained theleft lower extremity was prepped and draped in usual sterile fashion.  Incision site was marked and infiltrated with a total of 8 cc of 0.25% Marcaine with epinephrine 1-200,000  Attention was directed to the medial aspect of the left ankle where a curvilinear incision was made course on the posterior tibial tendon beginning just superior to the ankle and ending at the level of the navicular cuneiform joint region. Sharp and blunt dissection carried down to the retinaculum. At this time the tarsal tunnel area was initially entered. Proximal and distal incision of the medial tarsal tunnel retinaculum was then performed. The tibial nerve was noted proximal to the ankle and dissected as it coursed into the abductor canal. This was freed down into the plantar medial aspect of the foot into the arch region. The thickening of the ligament with some mild scarring of the nerve as it was freed away from the deeper tissue.   At this time attention was directed just superior to the tarsal tunnel canal where the  posterior tibial tendon was found as it coursed into the medial aspect of the navicular cuneiform region. The tendon sheath was then opened to the level of the medial malleolus. A second area of opening of the tendon sheath was performed just superior to the medial ankle. There was noted be a fair amount of scarring of the tendon into the tendon sheath itself. This was freed away. A small longitudinal tear was noted but this was very superficial on the posterior tibial tendon. This was sent for pathological examination. There was also noted to be some mild inflammation and fraying of the tendon without loss of attachment at its insertion on the navicular tuberosity. This was very minimally and mildly debrided with scissors at this time. At this time the posterior tibial tendon was then retracted dorsally and plantarly. The spring ligament was noted and found to be intact without obvious tearing. MRI was consistent with inflammation within the spring ligament. At this time a Topaz wand was then used to infiltrate along the spring ligament proximal and distal for further repair and decrease inflammation of this area. The Topaz wand was also used along the posterior tibial tendon from its insertion to the medial aspect of the ankle region.  All wounds were then flushed with copious amounts or irrigation. The posterior tibial tendon sheath was reapproximated with a 4-0 Vicryl. The subcutaneous tissue was reapproximated with 3-0 Vicryl and the skin reapproximated with 4-0 Monocryl.  The area was then infiltrated with a total of 20 cc of a 50-50 mixture of 0.25% Marcaine and Exparel long-acting  Marcaine.    Patient tolerated the procedure and anesthesia well.  Was transported from the OR to the PACU with all vital signs stable and vascular status intact. To be discharged per routine protocol.  Will follow up in approximately 1 week in the outpatient clinic.

## 2017-04-16 NOTE — Anesthesia Post-op Follow-up Note (Signed)
Anesthesia QCDR form completed.        

## 2017-04-19 LAB — SURGICAL PATHOLOGY

## 2017-05-28 MED FILL — HUMIRA PEN 40 MG/0.8ML PNKT: 40 | 28 days supply | Qty: 2 | Fill #1

## 2017-06-10 DIAGNOSIS — M4607 Spinal enthesopathy, lumbosacral region: Secondary | ICD-10-CM | POA: Diagnosis not present

## 2017-06-10 DIAGNOSIS — M9903 Segmental and somatic dysfunction of lumbar region: Secondary | ICD-10-CM | POA: Diagnosis not present

## 2017-06-10 DIAGNOSIS — M62830 Muscle spasm of back: Secondary | ICD-10-CM | POA: Diagnosis not present

## 2017-06-14 DIAGNOSIS — L4050 Arthropathic psoriasis, unspecified: Secondary | ICD-10-CM | POA: Diagnosis not present

## 2017-06-14 DIAGNOSIS — M25561 Pain in right knee: Secondary | ICD-10-CM | POA: Diagnosis not present

## 2017-06-16 ENCOUNTER — Telehealth: Payer: Self-pay | Admitting: Physician Assistant

## 2017-06-16 DIAGNOSIS — F419 Anxiety disorder, unspecified: Secondary | ICD-10-CM

## 2017-06-16 MED ORDER — ALPRAZOLAM 0.5 MG PO TABS: 0.5000 mg | ORAL_TABLET | Freq: Every evening | ORAL | 0 refills | Status: DC | PRN

## 2017-06-16 NOTE — Telephone Encounter (Signed)
Please Review.  Last office visit:11/2016 for ankle pain I don't see any follow-up visit for Anxiety with you.  Last time diagnosis was pulled for refill on 01/22/16 with Dr.Maloney

## 2017-06-16 NOTE — Telephone Encounter (Signed)
[facility] faxed a request for the following medication. Thanks CC  ALPRAZolam (XANAX) 0.5 MG tablet

## 2017-06-16 NOTE — Telephone Encounter (Signed)
Ok to phone in alprazolam 0.5mg  tab take one tab PO q hs prn sleep #30 NR  Needs appt for this issue to continue medication since not seen for this issue since 01/2016

## 2017-06-17 NOTE — Telephone Encounter (Signed)
Prescriptions called in to [facility]. Left voicemail. Patient advised that prescription was called in and needs appointment.  Thanks,  -Shakoya Gilmore

## 2017-06-17 NOTE — Telephone Encounter (Signed)
Patient requested appointment for Monday Nov.12. Scheduled at 1:45.  Thanks,  -Joseline

## 2017-06-21 ENCOUNTER — Other Ambulatory Visit: Payer: Self-pay

## 2017-06-21 ENCOUNTER — Ambulatory Visit: Payer: 59 | Admitting: Physician Assistant

## 2017-06-21 ENCOUNTER — Ambulatory Visit (INDEPENDENT_AMBULATORY_CARE_PROVIDER_SITE_OTHER): Payer: 59 | Admitting: Physician Assistant

## 2017-06-21 ENCOUNTER — Encounter: Payer: Self-pay | Admitting: Physician Assistant

## 2017-06-21 VITALS — BP 120/80 | HR 82 | Temp 98.3°F | Resp 16 | Ht 62.0 in | Wt 326.6 lb

## 2017-06-21 DIAGNOSIS — Z136 Encounter for screening for cardiovascular disorders: Secondary | ICD-10-CM | POA: Diagnosis not present

## 2017-06-21 DIAGNOSIS — R7303 Prediabetes: Secondary | ICD-10-CM

## 2017-06-21 DIAGNOSIS — F419 Anxiety disorder, unspecified: Secondary | ICD-10-CM | POA: Diagnosis not present

## 2017-06-21 DIAGNOSIS — Z6841 Body Mass Index (BMI) 40.0 and over, adult: Secondary | ICD-10-CM | POA: Diagnosis not present

## 2017-06-21 DIAGNOSIS — E039 Hypothyroidism, unspecified: Secondary | ICD-10-CM

## 2017-06-21 DIAGNOSIS — Z23 Encounter for immunization: Secondary | ICD-10-CM

## 2017-06-21 DIAGNOSIS — Z13220 Encounter for screening for lipoid disorders: Secondary | ICD-10-CM

## 2017-06-21 DIAGNOSIS — F3342 Major depressive disorder, recurrent, in full remission: Secondary | ICD-10-CM

## 2017-06-21 DIAGNOSIS — L4050 Arthropathic psoriasis, unspecified: Secondary | ICD-10-CM | POA: Diagnosis not present

## 2017-06-21 MED ORDER — ALPRAZOLAM 0.5 MG PO TABS: 0.5000 mg | ORAL_TABLET | Freq: Every evening | ORAL | 5 refills | Status: DC | PRN

## 2017-06-21 MED FILL — HUMIRA PEN 40 MG/0.8ML PNKT: 40 | 28 days supply | Qty: 2 | Fill #2

## 2017-06-21 NOTE — Patient Instructions (Signed)
Hypothyroidism Hypothyroidism is a disorder of the thyroid. The thyroid is a large gland that is located in the lower front of the neck. The thyroid releases hormones that control how the body works. With hypothyroidism, the thyroid does not make enough of these hormones. What are the causes? Causes of hypothyroidism may include:  Viral infections.  Pregnancy.  Your own defense system (immune system) attacking your thyroid.  Certain medicines.  Birth defects.  Past radiation treatments to your head or neck.  Past treatment with radioactive iodine.  Past surgical removal of part or all of your thyroid.  Problems with the gland that is located in the center of your brain (pituitary).  What are the signs or symptoms? Signs and symptoms of hypothyroidism may include:  Feeling as though you have no energy (lethargy).  Inability to tolerate cold.  Weight gain that is not explained by a change in diet or exercise habits.  Dry skin.  Coarse hair.  Menstrual irregularity.  Slowing of thought processes.  Constipation.  Sadness or depression.  How is this diagnosed? Your health care provider may diagnose hypothyroidism with blood tests and ultrasound tests. How is this treated? Hypothyroidism is treated with medicine that replaces the hormones that your body does not make. After you begin treatment, it may take several weeks for symptoms to go away. Follow these instructions at home:  Take medicines only as directed by your health care provider.  If you start taking any new medicines, tell your health care provider.  Keep all follow-up visits as directed by your health care provider. This is important. As your condition improves, your dosage needs may change. You will need to have blood tests regularly so that your health care provider can watch your condition. Contact a health care provider if:  Your symptoms do not get better with treatment.  You are taking thyroid  replacement medicine and: ? You sweat excessively. ? You have tremors. ? You feel anxious. ? You lose weight rapidly. ? You cannot tolerate heat. ? You have emotional swings. ? You have diarrhea. ? You feel weak. Get help right away if:  You develop chest pain.  You develop an irregular heartbeat.  You develop a rapid heartbeat. This information is not intended to replace advice given to you by your health care provider. Make sure you discuss any questions you have with your health care provider. Document Released: 07/27/2005 Document Revised: 01/02/2016 Document Reviewed: 12/12/2013 Elsevier Interactive Patient Education  2017 [employer]  

## 2017-06-21 NOTE — Progress Notes (Signed)
Patient: Kelsey Randolph Female    DOB: 1976/07/24   41 y.o.   MRN: [id number] Visit Date: 06/21/2017  Today's Provider: Mar Daring, PA-C   Chief Complaint  Patient presents with  . Anxiety   Subjective:    HPI  Anxiety, Follow-up  She  was last seen for this 1 years ago. Changes made at last visit include no changes.   She reports excellent compliance with treatment. She is not having side effects.   She reports excellent tolerance of treatment. Current symptoms include: none She feels she is Improved since last visit.  ------------------------------------------------------------------------  Hypothyroid, follow-up:  TSH  Date Value Ref Range Status  01/30/2016 2.010 0.450 - 4.500 uIU/mL Final   Wt Readings from Last 3 Encounters:  06/21/17 (!) 326 lb 9.6 oz (148.1 kg)  04/16/17 (!) 323 lb (146.5 kg)  04/09/17 (!) 323 lb (146.5 kg)    She was last seen for hypothyroid 1 years ago.  Management since that visit includes no changes. She reports excellent compliance with treatment. She is not having side effects.  She is exercising. She is experiencing none She denies change in energy level Weight trend: stable  She does also have psoriatic arthritis, stable on Humira. She is followed by Dr. Lindon Romp.   Also healing from posterior tibial tendon partial tear. Followed by Dr. Vickki Muff. Reports it took 2 months post repair to heal. Last week was first week without pain.  ------------------------------------------------------------------------      No Known Allergies   Current Outpatient Medications:  .  acetaminophen (TYLENOL) 500 MG tablet, Take 500 mg by mouth every 6 (six) hours as needed., Disp: , Rfl:  .  Adalimumab (HUMIRA PEN) 40 MG/0.8ML PNKT, Inject 0.8 mLs into the skin every 14 (fourteen) days. (Patient taking differently: Inject 0.8 mLs into the skin every 14 (fourteen) days. Last dose 03-12-2017), Disp: 2 each, Rfl: 2 .  ALPRAZolam  (XANAX) 0.5 MG tablet, Take 1 tablet (0.5 mg total) at bedtime as needed by mouth for anxiety., Disp: 30 tablet, Rfl: 0 .  cetirizine (ZYRTEC ALLERGY) 10 MG tablet, Take 10 mg by mouth daily. , Disp: , Rfl:  .  Cholecalciferol 1000 UNITS capsule, Take 1,000 Units by mouth daily. , Disp: , Rfl:  .  escitalopram (LEXAPRO) 20 MG tablet, Take 1 tablet (20 mg total) by mouth daily. (Patient taking differently: Take 20 mg by mouth every morning. ), Disp: 90 tablet, Rfl: 3 .  levothyroxine (SYNTHROID, LEVOTHROID) 50 MCG tablet, Take 1 tablet (50 mcg total) by mouth daily., Disp: 90 tablet, Rfl: 3 .  sulfaSALAzine (AZULFIDINE) 500 MG tablet, Take 500 mg by mouth 2 (two) times daily., Disp: , Rfl:   Review of Systems  Constitutional: Negative.   Respiratory: Negative.   Cardiovascular: Negative.   Gastrointestinal: Negative.   Endocrine: Negative.   Neurological: Negative.     Social History   Tobacco Use  . Smoking status: Former Smoker    Packs/day: 0.50    Years: 20.00    Pack years: 10.00    Types: Cigarettes    Last attempt to quit: 08/10/2015    Years since quitting: 1.8  . Smokeless tobacco: Never Used  Substance Use Topics  . Alcohol use: Yes    Comment: RARE   Objective:   BP 120/80 (BP Location: Left Arm, Patient Position: Sitting, Cuff Size: Large)   Pulse 82   Temp 98.3 F (36.8 C) (Oral)   Resp  16   Ht 5\' 2"  (1.575 m)   Wt (!) 326 lb 9.6 oz (148.1 kg)   LMP 06/18/2017 (Exact Date)   SpO2 96%   BMI 59.74 kg/m  Vitals:   06/21/17 0909  BP: 120/80  Pulse: 82  Resp: 16  Temp: 98.3 F (36.8 C)  TempSrc: Oral  SpO2: 96%  Weight: (!) 326 lb 9.6 oz (148.1 kg)  Height: 5\' 2"  (1.575 m)    Depression screen [facility] 2/9 06/21/2017 01/22/2016  Decreased Interest 0 0  Down, Depressed, Hopeless 0 1  PHQ - 2 Score 0 1  Altered sleeping 0 -  Tired, decreased energy 0 -  Change in appetite 0 -  Feeling bad or failure about yourself  0 -  Trouble concentrating 0 -  Moving  slowly or fidgety/restless 0 -  Suicidal thoughts 0 -  PHQ-9 Score 0 -  Difficult doing work/chores Not difficult at all -   GAD 7 : Generalized Anxiety Score 06/21/2017  Nervous, Anxious, on Edge 0  Control/stop worrying 0  Worry too much - different things 1  Trouble relaxing 0  Restless 0  Easily annoyed or irritable 1  Afraid - awful might happen 0  Total GAD 7 Score 2  Anxiety Difficulty Not difficult at all     Physical Exam  Constitutional: She appears well-developed and well-nourished. No distress.  Morbidly obese  Neck: Normal range of motion. Neck supple. No JVD present. No tracheal deviation present. No thyromegaly present.  Cardiovascular: Normal rate, regular rhythm and normal heart sounds. Exam reveals no gallop and no friction rub.  No murmur heard. Pulmonary/Chest: Effort normal and breath sounds normal. No respiratory distress. She has no wheezes. She has no rales.  Lymphadenopathy:    She has no cervical adenopathy.  Skin: She is not diaphoretic.  Psychiatric: She has a normal mood and affect. Her behavior is normal. Judgment and thought content normal.  Vitals reviewed.     Assessment & Plan:     1. Anxiety Stable. Diagnosis pulled for medication refill. Continue current medical treatment plan. - ALPRAZolam (XANAX) 0.5 MG tablet; Take 1 tablet (0.5 mg total) at bedtime as needed by mouth for anxiety.  Dispense: 30 tablet; Refill: 5  2. Adult hypothyroidism Stable on levothyroxine 72mcg. Will check labs as below and f/u pending results. - CBC w/Diff/Platelet - TSH  3. Encounter for lipid screening for cardiovascular disease Will check labs as below and f/u pending results. - Lipid Profile - HgB A1c  4. BMI 50.0-59.9, adult ([place]) Counseled patient on healthy lifestyle modifications including dieting and exercise.  - Lipid Profile - HgB A1c  5. Arthritis with psoriasis (H[facility]) Stable on Humira. Followed by Dr. Jefm Bryant.   6. Recurrent major  depressive disorder, in full remission ([place]) Stable on lexapro 20mg  and xanax 0.5mg  at bedtime.   7. Borderline diabetes Checking A1c.   8. Morbidly obese ([place])  9. Need for Td vaccine Had amoxil on 06/15/17. Needs to wait one more week before getting Td vaccine.          Mar Daring, PA-C  [facility]

## 2017-06-22 ENCOUNTER — Telehealth: Payer: Self-pay

## 2017-06-22 LAB — LIPID PANEL
CHOL/HDL RATIO: 3.6 (calc) (ref <5.0)
CHOLESTEROL: 180 mg/dL (ref <200)
HDL: 50 mg/dL — ABNORMAL LOW (ref >50)
LDL Cholesterol (Calc): 105 mg/dL (calc) — ABNORMAL HIGH
NON-HDL CHOLESTEROL (CALC): 130 mg/dL — AB (ref <130)
TRIGLYCERIDES: 133 mg/dL (ref <150)

## 2017-06-22 LAB — CBC WITH DIFFERENTIAL/PLATELET
BASOS PCT: 0.6 %
Basophils Absolute: 50 cells/uL (ref 0–200)
EOS PCT: 2.3 %
Eosinophils Absolute: 191 cells/uL (ref 15–500)
HEMATOCRIT: 41.9 % (ref 35.0–45.0)
Hemoglobin: 14.3 g/dL (ref 11.7–15.5)
LYMPHS ABS: 2175 {cells}/uL (ref 850–3900)
MCH: 28.7 pg (ref 27.0–33.0)
MCHC: 34.1 g/dL (ref 32.0–36.0)
MCV: 84.1 fL (ref 80.0–100.0)
MPV: 10.2 fL (ref 7.5–12.5)
Monocytes Relative: 4 %
NEUTROS PCT: 66.9 %
Neutro Abs: 5553 cells/uL (ref 1500–7800)
PLATELETS: 325 10*3/uL (ref 140–400)
RBC: 4.98 10*6/uL (ref 3.80–5.10)
RDW: 13 % (ref 11.0–15.0)
Total Lymphocyte: 26.2 %
WBC: 8.3 10*3/uL (ref 3.8–10.8)
WBCMIX: 332 {cells}/uL (ref 200–950)

## 2017-06-22 LAB — TSH: TSH: 2.3 mIU/L

## 2017-06-22 LAB — HEMOGLOBIN A1C
EAG (MMOL/L): 6.6 (calc)
Hgb A1c MFr Bld: 5.8 % of total Hgb — ABNORMAL HIGH (ref <5.7)
Mean Plasma Glucose: 120 (calc)

## 2017-06-22 NOTE — Telephone Encounter (Signed)
-----   Message from Mar Daring, PA-C sent at 06/22/2017  9:49 AM EST ----- A1c up slightly to 5.8 from 5.6. Cholesterol WNL and stable. Work on healthy lifestyle modifications including limiting fatty foods, red meats and carbs from diet. Try to increase physical activity to get in 150 min moderate activity per week. All other labs are WNL and stable.

## 2017-06-22 NOTE — Telephone Encounter (Signed)
Patient had seen results on Mychart. sd

## 2017-06-28 ENCOUNTER — Ambulatory Visit: Payer: 59 | Admitting: Physician Assistant

## 2017-07-12 DIAGNOSIS — L4052 Psoriatic arthritis mutilans: Secondary | ICD-10-CM | POA: Diagnosis not present

## 2017-07-12 DIAGNOSIS — D485 Neoplasm of uncertain behavior of skin: Secondary | ICD-10-CM | POA: Diagnosis not present

## 2017-07-12 DIAGNOSIS — Z1283 Encounter for screening for malignant neoplasm of skin: Secondary | ICD-10-CM | POA: Diagnosis not present

## 2017-07-12 DIAGNOSIS — D229 Melanocytic nevi, unspecified: Secondary | ICD-10-CM | POA: Diagnosis not present

## 2017-07-12 DIAGNOSIS — D225 Melanocytic nevi of trunk: Secondary | ICD-10-CM | POA: Diagnosis not present

## 2017-07-12 DIAGNOSIS — L578 Other skin changes due to chronic exposure to nonionizing radiation: Secondary | ICD-10-CM | POA: Diagnosis not present

## 2017-07-12 DIAGNOSIS — L905 Scar conditions and fibrosis of skin: Secondary | ICD-10-CM | POA: Diagnosis not present

## 2017-07-12 DIAGNOSIS — L812 Freckles: Secondary | ICD-10-CM | POA: Diagnosis not present

## 2017-07-12 DIAGNOSIS — D1801 Hemangioma of skin and subcutaneous tissue: Secondary | ICD-10-CM | POA: Diagnosis not present

## 2017-07-12 DIAGNOSIS — D2262 Melanocytic nevi of left upper limb, including shoulder: Secondary | ICD-10-CM | POA: Diagnosis not present

## 2017-07-12 DIAGNOSIS — L821 Other seborrheic keratosis: Secondary | ICD-10-CM | POA: Diagnosis not present

## 2017-07-12 DIAGNOSIS — L400 Psoriasis vulgaris: Secondary | ICD-10-CM | POA: Diagnosis not present

## 2017-07-21 ENCOUNTER — Other Ambulatory Visit: Payer: Self-pay | Admitting: Pharmacist

## 2017-07-21 MED ORDER — ADALIMUMAB 40 MG/0.8ML ~~LOC~~ AJKT: 0.8000 mL | AUTO-INJECTOR | SUBCUTANEOUS | 1 refills | Status: DC

## 2017-07-21 MED FILL — HUMIRA PEN 40 MG/0.8ML PNKT: 40 | 28 days supply | Qty: 2 | Fill #0

## 2017-08-18 MED FILL — HUMIRA PEN 40 MG/0.8ML PNKT: 40 | 28 days supply | Qty: 2 | Fill #1

## 2017-09-15 MED FILL — HUMIRA PEN 40 MG/0.8ML PNKT: 40 | 28 days supply | Qty: 2 | Fill #2

## 2017-09-28 DIAGNOSIS — L988 Other specified disorders of the skin and subcutaneous tissue: Secondary | ICD-10-CM | POA: Diagnosis not present

## 2017-09-28 DIAGNOSIS — D485 Neoplasm of uncertain behavior of skin: Secondary | ICD-10-CM | POA: Diagnosis not present

## 2017-10-11 MED FILL — HUMIRA PEN 40 MG/0.8ML PNKT: 40 | 28 days supply | Qty: 2 | Fill #3

## 2017-11-15 MED FILL — HUMIRA PEN 40 MG/0.8ML PNKT: 40 | 28 days supply | Qty: 2 | Fill #4

## 2017-12-14 MED FILL — HUMIRA PEN 40 MG/0.8ML PNKT: 40 | 28 days supply | Qty: 2 | Fill #5

## 2017-12-21 DIAGNOSIS — M25561 Pain in right knee: Secondary | ICD-10-CM | POA: Diagnosis not present

## 2017-12-21 DIAGNOSIS — L4050 Arthropathic psoriasis, unspecified: Secondary | ICD-10-CM | POA: Diagnosis not present

## 2018-01-05 DIAGNOSIS — M4607 Spinal enthesopathy, lumbosacral region: Secondary | ICD-10-CM | POA: Diagnosis not present

## 2018-01-05 DIAGNOSIS — M9903 Segmental and somatic dysfunction of lumbar region: Secondary | ICD-10-CM | POA: Diagnosis not present

## 2018-01-05 DIAGNOSIS — M62830 Muscle spasm of back: Secondary | ICD-10-CM | POA: Diagnosis not present

## 2018-01-14 ENCOUNTER — Encounter: Payer: Self-pay | Admitting: Physician Assistant

## 2018-01-14 ENCOUNTER — Ambulatory Visit (INDEPENDENT_AMBULATORY_CARE_PROVIDER_SITE_OTHER): Payer: 59 | Admitting: Physician Assistant

## 2018-01-14 ENCOUNTER — Other Ambulatory Visit: Payer: Self-pay | Admitting: Internal Medicine

## 2018-01-14 VITALS — BP 120/70 | HR 80 | Temp 97.9°F | Resp 16 | Ht 62.0 in | Wt 331.0 lb

## 2018-01-14 DIAGNOSIS — F419 Anxiety disorder, unspecified: Secondary | ICD-10-CM

## 2018-01-14 DIAGNOSIS — L4050 Arthropathic psoriasis, unspecified: Secondary | ICD-10-CM | POA: Diagnosis not present

## 2018-01-14 DIAGNOSIS — F3342 Major depressive disorder, recurrent, in full remission: Secondary | ICD-10-CM | POA: Insufficient documentation

## 2018-01-14 DIAGNOSIS — Z23 Encounter for immunization: Secondary | ICD-10-CM

## 2018-01-14 DIAGNOSIS — Z1231 Encounter for screening mammogram for malignant neoplasm of breast: Secondary | ICD-10-CM

## 2018-01-14 DIAGNOSIS — E039 Hypothyroidism, unspecified: Secondary | ICD-10-CM | POA: Diagnosis not present

## 2018-01-14 DIAGNOSIS — E559 Vitamin D deficiency, unspecified: Secondary | ICD-10-CM | POA: Diagnosis not present

## 2018-01-14 DIAGNOSIS — R7303 Prediabetes: Secondary | ICD-10-CM

## 2018-01-14 DIAGNOSIS — Z0000 Encounter for general adult medical examination without abnormal findings: Secondary | ICD-10-CM

## 2018-01-14 DIAGNOSIS — Z1239 Encounter for other screening for malignant neoplasm of breast: Secondary | ICD-10-CM

## 2018-01-14 DIAGNOSIS — Z6841 Body Mass Index (BMI) 40.0 and over, adult: Secondary | ICD-10-CM

## 2018-01-14 MED ORDER — ADALIMUMAB 40 MG/0.8ML ~~LOC~~ AJKT: 0.8000 mL | AUTO-INJECTOR | SUBCUTANEOUS | 1 refills | Status: DC

## 2018-01-14 MED ORDER — ESCITALOPRAM OXALATE 20 MG PO TABS: 20.0000 mg | ORAL_TABLET | Freq: Every day | ORAL | 1 refills | Status: DC

## 2018-01-14 MED ORDER — LEVOTHYROXINE SODIUM 50 MCG PO TABS: 50.0000 ug | ORAL_TABLET | Freq: Every day | ORAL | 3 refills | Status: DC

## 2018-01-14 MED ORDER — ALPRAZOLAM 0.5 MG PO TABS: 0.5000 mg | ORAL_TABLET | Freq: Every evening | ORAL | 1 refills | Status: DC | PRN

## 2018-01-14 MED FILL — HUMIRA PEN 40 MG/0.8ML PNKT: 40 | 28 days supply | Qty: 2 | Fill #0

## 2018-01-14 NOTE — Progress Notes (Signed)
Patient: Kelsey Randolph, Female    DOB: 17-Jan-1976, 42 y.o.   MRN: [id number] Visit Date: 01/14/2018  Today's Provider: Mar Daring, PA-C   Chief Complaint  Patient presents with  . Annual Exam   Subjective:    Annual physical exam Kelsey Randolph is a 42 y.o. female who presents today for health maintenance and complete physical. She feels well. She reports exercising, does boot camp 3 times a week. She reports she is sleeping well.  Last CPE:01/22/16 Pap:01/22/16-Normal -----------------------------------------------------------------   Review of Systems  Constitutional: Negative.   HENT: Negative.   Eyes: Negative.   Respiratory: Negative.   Cardiovascular: Negative.   Gastrointestinal: Negative.   Endocrine: Negative.   Genitourinary: Negative.   Musculoskeletal: Positive for arthralgias and myalgias.  Skin: Negative.   Allergic/Immunologic: Negative.   Neurological: Negative.   Hematological: Negative.   Psychiatric/Behavioral: Negative.     Social History      She  reports that she quit smoking about 2 years ago. Her smoking use included cigarettes. She has a 10.00 pack-year smoking history. She has never used smokeless tobacco. She reports that she drinks alcohol. She reports that she does not use drugs.       Social History   Socioeconomic History  . Marital status: Married    Spouse name: Not on file  . Number of children: Not on file  . Years of education: Not on file  . Highest education level: Not on file  Occupational History    Employer: [employer]  . Financial resource strain: Not on file  . Food insecurity:    Worry: Not on file    Inability: Not on file  . Transportation needs:    Medical: Not on file    Non-medical: Not on file  Tobacco Use  . Smoking status: Former Smoker    Packs/day: 0.50    Years: 20.00    Pack years: 10.00    Types: Cigarettes    Last attempt to quit: 08/10/2015    Years since  quitting: 2.4  . Smokeless tobacco: Never Used  Substance and Sexual Activity  . Alcohol use: Yes    Comment: RARE  . Drug use: No  . Sexual activity: Not on file  Lifestyle  . Physical activity:    Days per week: Not on file    Minutes per session: Not on file  . Stress: Not on file  Relationships  . Social connections:    Talks on phone: Not on file    Gets together: Not on file    Attends religious service: Not on file    Active member of club or organization: Not on file    Attends meetings of clubs or organizations: Not on file    Relationship status: Not on file  Other Topics Concern  . Not on file  Social History Narrative  . Not on file    Past Medical History:  Diagnosis Date  . Allergy   . Anxiety   . Arthritis    PSORIATIC  . Depression   . GERD (gastroesophageal reflux disease)    RARE-NO MEDS  . History of kidney stones 2004  . Hypothyroidism   . PONV (postoperative nausea and vomiting)    WITH KIDNEY STONE AND TONSILLECTOMY BUT HAD NO N/V WITH HER LAST FOOT SURGERY IN 2013     Patient Active Problem List   Diagnosis Date Noted  . Recurrent major depressive disorder,  in full remission ([place]) 01/14/2018  . Abnormal blood chemistry 01/28/2015  . Allergic rhinitis 01/28/2015  . Anxiety 01/28/2015  . Chest pain 01/28/2015  . Family planning 01/28/2015  . Acid reflux 01/28/2015  . LBP (low back pain) 01/28/2015  . Plantar fasciitis 01/28/2015  . Borderline diabetes 01/28/2015  . Psoriasis 01/28/2015  . Avitaminosis D 01/28/2015  . Disease of white blood cells 11/22/2009  . Awareness of heartbeats 03/23/2008  . Adult hypothyroidism 03/14/2008  . History of tobacco use 11/30/2007  . Calculus of kidney 11/23/2007  . Arthritis with psoriasis ([place]) 12/03/2005  . Clinical depression 03/04/2001  . Morbidly obese ([place]) 08/10/1998    Past Surgical History:  Procedure Laterality Date  . FOOT SURGERY Left   . kidney stones removed  2004  . TARSAL  TUNNEL RELEASE Left 04/16/2017   Procedure: TARSAL TUNNEL RELEASE;  Surgeon: Samara Deist, DPM;  Location: [facility] ORS;  Service: Podiatry;  Laterality: Left;  . TENDON REPAIR Left 04/16/2017   Procedure: FLEXOR TENDON REPAIR-SECONDARY ;  Surgeon: Samara Deist, DPM;  Location: [facility] ORS;  Service: Podiatry;  Laterality: Left;  . tonsillectomy and adenoidectomy  1993    Family History        Family Status  Relation Name Status  . Mother  Alive  . Brother  Alive  . PGF  Deceased at age 42  . Father  Deceased at age 67       MI  . MGM  (Not Specified)        Her family history includes Dementia in her maternal grandmother; Depression in her mother; Diabetes in her maternal grandmother; Healthy in her brother; Heart disease in her father and paternal grandfather; Hyperlipidemia in her mother; Hypertension in her mother.      No Known Allergies   Current Outpatient Medications:  .  acetaminophen (TYLENOL) 500 MG tablet, Take 500 mg by mouth every 6 (six) hours as needed., Disp: , Rfl:  .  Adalimumab (HUMIRA PEN) 40 MG/0.8ML PNKT, Inject 0.8 mLs into the skin every 14 (fourteen) days., Disp: 6 each, Rfl: 1 .  ALPRAZolam (XANAX) 0.5 MG tablet, Take 1 tablet (0.5 mg total) at bedtime as needed by mouth for anxiety., Disp: 30 tablet, Rfl: 5 .  cetirizine (ZYRTEC ALLERGY) 10 MG tablet, Take 10 mg by mouth daily. , Disp: , Rfl:  .  Cholecalciferol 1000 UNITS capsule, Take 1,000 Units by mouth daily. , Disp: , Rfl:  .  escitalopram (LEXAPRO) 20 MG tablet, Take 1 tablet (20 mg total) by mouth daily. (Patient taking differently: Take 20 mg by mouth every morning. ), Disp: 90 tablet, Rfl: 3 .  levothyroxine (SYNTHROID, LEVOTHROID) 50 MCG tablet, Take 1 tablet (50 mcg total) by mouth daily., Disp: 90 tablet, Rfl: 3 .  sulfaSALAzine (AZULFIDINE) 500 MG tablet, Take 500 mg by mouth 2 (two) times daily., Disp: , Rfl:    Patient Care Team: Mar Daring, PA-C as PCP - General (Family Medicine)        Objective:   Vitals: BP 120/70 (BP Location: Left Wrist, Patient Position: Sitting, Cuff Size: Normal)   Pulse 80   Temp 97.9 F (36.6 C) (Oral)   Resp 16   Ht 5\' 2"  (1.575 m)   Wt (!) 331 lb (150.1 kg)   LMP 01/11/2018   BMI 60.54 kg/m    Physical Exam  Constitutional: She is oriented to person, place, and time. She appears well-developed and well-nourished. No distress.  Morbidly obese  HENT:  Head: Normocephalic and atraumatic.  Right Ear: Hearing, tympanic membrane, external ear and ear canal normal.  Left Ear: Hearing, tympanic membrane, external ear and ear canal normal.  Nose: Nose normal.  Mouth/Throat: Uvula is midline, oropharynx is clear and moist and mucous membranes are normal. No oropharyngeal exudate.  Eyes: Pupils are equal, round, and reactive to light. Conjunctivae and EOM are normal. Right eye exhibits no discharge. Left eye exhibits no discharge. No scleral icterus.  Neck: Normal range of motion. Neck supple. No JVD present. No tracheal deviation present. No thyromegaly present.  Cardiovascular: Normal rate, regular rhythm, normal heart sounds and intact distal pulses. Exam reveals no gallop and no friction rub.  No murmur heard. Pulmonary/Chest: Effort normal and breath sounds normal. No respiratory distress. She has no wheezes. She has no rales. She exhibits no tenderness. Right breast exhibits no inverted nipple, no mass, no nipple discharge, no skin change and no tenderness. Left breast exhibits no inverted nipple, no mass, no nipple discharge, no skin change and no tenderness. No breast swelling, tenderness, discharge or bleeding. Breasts are symmetrical.  Abdominal: Soft. Bowel sounds are normal. She exhibits no distension and no mass. There is no tenderness. There is no rebound and no guarding.  Musculoskeletal: Normal range of motion. She exhibits no edema or tenderness.  Lymphadenopathy:    She has no cervical adenopathy.  Neurological: She is alert  and oriented to person, place, and time.  Skin: Skin is warm and dry. No rash noted. She is not diaphoretic.  Diffuse psoriasis  Psychiatric: She has a normal mood and affect. Her behavior is normal. Judgment and thought content normal.  Vitals reviewed.    Depression Screen PHQ 2/9 Scores 01/14/2018 06/21/2017 01/22/2016  PHQ - 2 Score 0 0 1  PHQ- 9 Score 0 0 -      Assessment & Plan:     Routine Health Maintenance and Physical Exam  Exercise Activities and Dietary recommendations Goals    None      Immunization History  Administered Date(s) Administered  . Influenza-Unspecified 05/20/2017  . Td 01/14/2018  . Tdap 10/21/2006    Health Maintenance  Topic Date Due  . TETANUS/TDAP  10/20/2016  . INFLUENZA VACCINE  03/10/2018  . PAP SMEAR  01/22/2019  . HIV Screening  Completed     Discussed health benefits of physical activity, and encouraged her to engage in regular exercise appropriate for her age and condition.    1. Annual physical exam Normal physical exam today. Will check labs as below and f/u pending lab results. If labs are stable and WNL she will not need to have these rechecked for one year at her next annual physical exam. She is to call the office in the meantime if she has any acute issue, questions or concerns. - CBC with Differential/Platelet - Comprehensive metabolic panel - Hemoglobin A1c - Lipid panel - TSH  2. Breast cancer screening Breast exam today was normal. There is no family history of breast cancer. She does perform regular self breast exams. Mammogram was ordered as below. Information for [facility] was given to patient so she may schedule her mammogram at her convenience. - MM DIGITAL SCREENING BILATERAL; Future  3. Adult hypothyroidism Stable. Diagnosis pulled for medication refill. Continue current medical treatment plan. Will check labs as below and f/u pending results. - CBC with Differential/Platelet - Comprehensive  metabolic panel - TSH - levothyroxine (SYNTHROID, LEVOTHROID) 50 MCG tablet; Take 1 tablet (50 mcg total) by  mouth daily before breakfast.  Dispense: 90 tablet; Refill: 3  4. Borderline diabetes Diet controlled. Will check labs as below and f/u pending results. - CBC with Differential/Platelet - Comprehensive metabolic panel - Hemoglobin A1c  5. Avitaminosis D H/O this. Will check labs as below and f/u pending results. - CBC with Differential/Platelet - Comprehensive metabolic panel - Vitamin D (25 hydroxy)  6. Class 3 severe obesity due to excess calories without serious comorbidity with body mass index (BMI) of 60.0 to 69.9 in adult [facility]) Counseled patient on healthy lifestyle modifications including dieting and exercise.  - Comprehensive metabolic panel - Hemoglobin A1c - Lipid panel  7. Arthritis with psoriasis ([place]) Followed by Dr. Lindon Romp.   8. Recurrent major depressive disorder, in full remission ([place]) Stable. Diagnosis pulled for medication refill. Continue current medical treatment plan. - escitalopram (LEXAPRO) 20 MG tablet; Take 1 tablet (20 mg total) by mouth daily.  Dispense: 90 tablet; Refill: 1  9. Anxiety Stable. Diagnosis pulled for medication refill. Continue current medical treatment plan. - ALPRAZolam (XANAX) 0.5 MG tablet; Take 1 tablet (0.5 mg total) by mouth at bedtime as needed for anxiety.  Dispense: 90 tablet; Refill: 1  10. Need for Td vaccine Td Vaccine given to patient without complications. Patient sat for 15 minutes after administration and was tolerated well without adverse effects. - Td : Tetanus/diphtheria >7yo Preservative  free  --------------------------------------------------------------------    Mar Daring, PA-C  [facility]

## 2018-01-14 NOTE — Patient Instructions (Signed)

## 2018-01-19 DIAGNOSIS — Z0000 Encounter for general adult medical examination without abnormal findings: Secondary | ICD-10-CM | POA: Diagnosis not present

## 2018-01-19 DIAGNOSIS — Z6841 Body Mass Index (BMI) 40.0 and over, adult: Secondary | ICD-10-CM | POA: Diagnosis not present

## 2018-01-19 DIAGNOSIS — E559 Vitamin D deficiency, unspecified: Secondary | ICD-10-CM | POA: Diagnosis not present

## 2018-01-19 DIAGNOSIS — R7303 Prediabetes: Secondary | ICD-10-CM | POA: Diagnosis not present

## 2018-01-19 DIAGNOSIS — E039 Hypothyroidism, unspecified: Secondary | ICD-10-CM | POA: Diagnosis not present

## 2018-01-20 ENCOUNTER — Telehealth: Payer: Self-pay

## 2018-01-20 LAB — COMPREHENSIVE METABOLIC PANEL
A/G RATIO: 1.7 (ref 1.2–2.2)
ALBUMIN: 3.8 g/dL (ref 3.5–5.5)
ALT: 11 IU/L (ref 0–32)
AST: 14 IU/L (ref 0–40)
Alkaline Phosphatase: 87 IU/L (ref 39–117)
BILIRUBIN TOTAL: 0.4 mg/dL (ref 0.0–1.2)
BUN / CREAT RATIO: 28 — AB (ref 9–23)
BUN: 13 mg/dL (ref 6–24)
CALCIUM: 9.1 mg/dL (ref 8.7–10.2)
CO2: 23 mmol/L (ref 20–29)
Chloride: 102 mmol/L (ref 96–106)
Creatinine, Ser: 0.47 mg/dL — ABNORMAL LOW (ref 0.57–1.00)
GFR, EST AFRICAN AMERICAN: 143 mL/min/{1.73_m2} (ref >59)
GFR, EST NON AFRICAN AMERICAN: 124 mL/min/{1.73_m2} (ref >59)
GLOBULIN, TOTAL: 2.3 g/dL (ref 1.5–4.5)
Glucose: 96 mg/dL (ref 65–99)
POTASSIUM: 4.1 mmol/L (ref 3.5–5.2)
Sodium: 140 mmol/L (ref 134–144)
TOTAL PROTEIN: 6.1 g/dL (ref 6.0–8.5)

## 2018-01-20 LAB — VITAMIN D 25 HYDROXY (VIT D DEFICIENCY, FRACTURES): VIT D 25 HYDROXY: 30.1 ng/mL (ref 30.0–100.0)

## 2018-01-20 LAB — CBC WITH DIFFERENTIAL/PLATELET
Basophils Absolute: 0 10*3/uL (ref 0.0–0.2)
Basos: 0 %
EOS (ABSOLUTE): 0.2 10*3/uL (ref 0.0–0.4)
EOS: 2 %
HEMATOCRIT: 41.9 % (ref 34.0–46.6)
HEMOGLOBIN: 14 g/dL (ref 11.1–15.9)
IMMATURE GRANS (ABS): 0 10*3/uL (ref 0.0–0.1)
IMMATURE GRANULOCYTES: 0 %
LYMPHS: 20 %
Lymphocytes Absolute: 1.6 10*3/uL (ref 0.7–3.1)
MCH: 28.6 pg (ref 26.6–33.0)
MCHC: 33.4 g/dL (ref 31.5–35.7)
MCV: 86 fL (ref 79–97)
MONOCYTES: 4 %
Monocytes Absolute: 0.3 10*3/uL (ref 0.1–0.9)
NEUTROS ABS: 6 10*3/uL (ref 1.4–7.0)
NEUTROS PCT: 74 %
PLATELETS: 303 10*3/uL (ref 150–450)
RBC: 4.9 x10E6/uL (ref 3.77–5.28)
RDW: 14.4 % (ref 12.3–15.4)
WBC: 8.1 10*3/uL (ref 3.4–10.8)

## 2018-01-20 LAB — LIPID PANEL
CHOL/HDL RATIO: 2.7 ratio (ref 0.0–4.4)
CHOLESTEROL TOTAL: 153 mg/dL (ref 100–199)
HDL: 57 mg/dL (ref >39)
LDL CALC: 81 mg/dL (ref 0–99)
Triglycerides: 73 mg/dL (ref 0–149)
VLDL CHOLESTEROL CAL: 15 mg/dL (ref 5–40)

## 2018-01-20 LAB — HEMOGLOBIN A1C
Est. average glucose Bld gHb Est-mCnc: 117 mg/dL
Hgb A1c MFr Bld: 5.7 % — ABNORMAL HIGH (ref 4.8–5.6)

## 2018-01-20 LAB — TSH: TSH: 2.74 u[IU]/mL (ref 0.450–4.500)

## 2018-01-20 NOTE — Telephone Encounter (Signed)
-----   Message from Mar Daring, [place] sent at 01/20/2018  8:55 AM EDT ----- Blood count normal. Kidney and liver function normal. Sugar borderline high at 5.7 but improved, had been 5.8. Cholesterol normal. Thyroid normal. Vit D normal but low normal. Recommend taking an OTC Vit D supplement of 1000 IU daily.

## 2018-01-20 NOTE — Telephone Encounter (Signed)
Viewed by Windell Norfolk Gaubert on 01/20/2018 8:56 AM

## 2018-01-21 DIAGNOSIS — M25561 Pain in right knee: Secondary | ICD-10-CM | POA: Diagnosis not present

## 2018-01-21 DIAGNOSIS — G8929 Other chronic pain: Secondary | ICD-10-CM | POA: Diagnosis not present

## 2018-01-21 DIAGNOSIS — M175 Other unilateral secondary osteoarthritis of knee: Secondary | ICD-10-CM | POA: Diagnosis not present

## 2018-01-21 DIAGNOSIS — L4050 Arthropathic psoriasis, unspecified: Secondary | ICD-10-CM | POA: Diagnosis not present

## 2018-01-28 DIAGNOSIS — M175 Other unilateral secondary osteoarthritis of knee: Secondary | ICD-10-CM | POA: Diagnosis not present

## 2018-02-15 MED FILL — HUMIRA PEN 40 MG/0.8ML PNKT: 40 | 28 days supply | Qty: 2 | Fill #1

## 2018-03-02 ENCOUNTER — Other Ambulatory Visit: Payer: Self-pay | Admitting: Sports Medicine

## 2018-03-02 DIAGNOSIS — G8929 Other chronic pain: Secondary | ICD-10-CM

## 2018-03-02 DIAGNOSIS — M25561 Pain in right knee: Principal | ICD-10-CM

## 2018-03-16 ENCOUNTER — Ambulatory Visit
Admission: RE | Admit: 2018-03-16 | Discharge: 2018-03-16 | Disposition: A | Discharge status: Home / Self Care | Payer: 59 | Source: Ambulatory Visit | Attending: Sports Medicine | Admitting: Sports Medicine

## 2018-03-16 DIAGNOSIS — G8929 Other chronic pain: Secondary | ICD-10-CM | POA: Diagnosis not present

## 2018-03-16 DIAGNOSIS — X58XXXA Exposure to other specified factors, initial encounter: Secondary | ICD-10-CM | POA: Insufficient documentation

## 2018-03-16 DIAGNOSIS — M25561 Pain in right knee: Secondary | ICD-10-CM | POA: Insufficient documentation

## 2018-03-16 DIAGNOSIS — M1711 Unilateral primary osteoarthritis, right knee: Secondary | ICD-10-CM | POA: Diagnosis not present

## 2018-03-16 DIAGNOSIS — M23321 Other meniscus derangements, posterior horn of medial meniscus, right knee: Secondary | ICD-10-CM | POA: Diagnosis not present

## 2018-03-16 DIAGNOSIS — M25461 Effusion, right knee: Secondary | ICD-10-CM | POA: Insufficient documentation

## 2018-03-16 DIAGNOSIS — S83231A Complex tear of medial meniscus, current injury, right knee, initial encounter: Secondary | ICD-10-CM | POA: Diagnosis not present

## 2018-03-16 MED FILL — HUMIRA PEN 40 MG/0.8ML PNKT: 40 | 28 days supply | Qty: 2 | Fill #2

## 2018-03-31 ENCOUNTER — Encounter: Payer: Self-pay | Admitting: Dietician

## 2018-03-31 ENCOUNTER — Encounter: Payer: 59 | Attending: Physician Assistant | Admitting: Dietician

## 2018-03-31 VITALS — Ht 63.0 in | Wt 326.7 lb

## 2018-03-31 DIAGNOSIS — Z713 Dietary counseling and surveillance: Secondary | ICD-10-CM

## 2018-03-31 DIAGNOSIS — G8929 Other chronic pain: Secondary | ICD-10-CM | POA: Diagnosis not present

## 2018-03-31 DIAGNOSIS — M25561 Pain in right knee: Secondary | ICD-10-CM | POA: Diagnosis not present

## 2018-03-31 NOTE — Progress Notes (Signed)
 Employee "self referral" nutrition session: Start time: 0900   End time: 0945  Height: 5'3" Weight: 326.7lbs  Met with employee to discuss his/her nutritional concerns and diet history.   Diet history: Patient reports following a low-carb similar to keto diet, some tracking of food intake, but difficult to stick with any diet long-term, will backtrack when having a stressful day. Voices confusion with conflicting diet advice from people, media. Has been exercising in boot camp program until having knee injury (torn meniscus), and as the exercise provided stress management, has been engaging in more stress eating in the past few weeks.    Typical eating pattern: Breakfast: egg beater with sm amt cheese or sharp cheese cubes and [place] bacon; protein drink in past 2 weeks Snack:  Lunch: often out-- salads with grilled chicken and ranch, no croutons  Snack: sometimes Frappe; was eating almonds or cheese stick Supper: often fast food, pizza, [place] Snack: protein bar, keto snack, less healthy more recently Beverages: water, other sugar free drinks.    Education topics covered during this visit:  General nutrition/ Healthy eating-- appropriate macronutrient balance and importance of including all food groups  Weight Concerns: provided guidance for 1700kcal intake with 45%CHO, 25%protein, 30% fat   Educational resources provided:  Museum/gallery conservator with food lists  Sample menus    Additional Comments:    Plan: Patient plans to track food intake  Encouraged patient to return with questions or other topics to address; scheduled to return in 1 day for follow-up

## 2018-04-01 ENCOUNTER — Encounter: Payer: 59 | Admitting: Dietician

## 2018-04-01 DIAGNOSIS — M175 Other unilateral secondary osteoarthritis of knee: Secondary | ICD-10-CM | POA: Diagnosis not present

## 2018-04-01 DIAGNOSIS — Z713 Dietary counseling and surveillance: Secondary | ICD-10-CM

## 2018-04-01 DIAGNOSIS — M23203 Derangement of unspecified medial meniscus due to old tear or injury, right knee: Secondary | ICD-10-CM | POA: Diagnosis not present

## 2018-04-01 DIAGNOSIS — M25461 Effusion, right knee: Secondary | ICD-10-CM | POA: Diagnosis not present

## 2018-04-01 DIAGNOSIS — M25561 Pain in right knee: Secondary | ICD-10-CM | POA: Diagnosis not present

## 2018-04-01 NOTE — Progress Notes (Signed)
[employer] Employee "self referral" nutrition session: Start time: 0905   End time: 0930  Height: 5'3" Weight: 326.7lbs (measured 8/22)  Met with employee to discuss his/her nutritional concerns and diet history.   Diet history: Since 8/22-- patient reports limiting starch at restaurant meal yesterday. She voices confusion of how much carbohydrate, protein, and fat she needs daily, would like to have goals in number form. Patient reviewed materials provided and states information is and will be helpful.     Education topics covered during this visit:  General nutrition/ Healthy eating-- guidelines for dining out  Weight Concerns-- provided goals for macronutrients: min. 130g CHO, or 180g based on 40% of 1800kcal, 135g protein based on 30%, and 60g fat for remaining 30%.   Educational resources provided: Eating Healthy when [employer]    Plan: Patient to bring any questions or other topics for discussion when returning 04/04/18 for final visit.

## 2018-04-04 ENCOUNTER — Encounter: Payer: 59 | Admitting: Dietician

## 2018-04-04 VITALS — Ht 63.0 in | Wt 328.6 lb

## 2018-04-04 DIAGNOSIS — Z713 Dietary counseling and surveillance: Secondary | ICD-10-CM

## 2018-04-04 NOTE — Patient Instructions (Signed)
   Continue to pre-prep meals for healthy food choices.   Continue to drink plenty of fluids, mostly water. Other beverages ideally sugar free.   Keep tracking food intake with app as able.   Keep up some form of regular exercise as tolerated.   Contact Pam/ [facility] for ongoing support or future visits.

## 2018-04-04 NOTE — Progress Notes (Signed)
[facility] Employee "self referral" nutrition session: Start time: 0900   End time: 0915  Height: 5'3" Weight: 228.6lbs  Met with employee to discuss his/her nutritional concerns and diet history.   Diet history: Patient reports making healthy food choices over the weekend. She has begun tracking food intake using a phone app, and is aiming for balanced nutrition. She has prepped a lunch meal to reduce lunchtime restaurant meals. She plans to meet with a personal trainer today to begin regular exercise that will avoid excess pressure on her knees.     Education topics covered during this visit:  General nutrition/ Healthy eating  Weight Concerns-- avoiding guilt feelings; losing inches vs lbs; typical weight loss patterns; importance of positive support   Educational resources provided: Goals and instructions on AVS, contact information for ongoing support    Plan: Patient will continue tracking intake, pre-prepping meals, increasing water intake, and incorporating regular exercise. Encouraged her to call as needed with any questions, concerns, or for ongoing support. Offered future follow-up if needed.

## 2018-04-06 ENCOUNTER — Other Ambulatory Visit: Payer: Self-pay

## 2018-04-06 ENCOUNTER — Ambulatory Visit: Payer: 59 | Attending: Orthopedic Surgery

## 2018-04-06 DIAGNOSIS — M25561 Pain in right knee: Secondary | ICD-10-CM | POA: Insufficient documentation

## 2018-04-06 DIAGNOSIS — G8929 Other chronic pain: Secondary | ICD-10-CM

## 2018-04-06 DIAGNOSIS — M6281 Muscle weakness (generalized): Secondary | ICD-10-CM

## 2018-04-06 NOTE — Patient Instructions (Signed)
Access Code: [email or web address]  URL: [email or web address]  Date: 04/06/2018  Prepared by: Lieutenant Diego   Exercises  Supine Quad Set - 10 reps - 1 sets - 5 hold - 1x daily - 7x weekly  Prone Quadriceps Stretch with Strap - 3 reps - 1 sets - 30 hold - 1x daily - 7x weekly  Standing Heel Raises - 10 reps - 2 sets - 1x daily - 7x weekly  Seated Hamstring Stretch - 3 reps - 1 sets - 30 hold - 1x daily - 7x weekly  Sidelying Hip Abduction - 15 reps - 1 sets - 1x daily - 7x weekly  Supine Piriformis Stretch - 10 reps - 1 sets - 30 hold - 1x daily - 7x weekly  Standing Hip Extension with Counter Support - 15 reps - 3 sets - 1x daily - 7x weekly  Gastroc Stretch on Wall - 3 reps - 1 sets - 30 hold - 1x daily - 7x weekly  Soleus Stretch on Wall - 3 reps - 1 sets - 30 hold - 1x daily - 7x weekly  Instructed to perform every other day

## 2018-04-06 NOTE — Therapy (Signed)
[facility] [address] [place], [address] Phone: [phone]   Fax:  [phone]  Physical Therapy Evaluation  Patient Details  Name: Kelsey Randolph MRN: [id number] Date of Birth: 06/11/1976 Referring Provider: Leim Fabry   Encounter Date: 04/06/2018  PT End of Session - 04/06/18 1624    Visit Number  1    Number of Visits  8    Date for PT Re-Evaluation  05/04/18    PT Start Time  1512    PT Stop Time  1615    PT Time Calculation (min)  63 min    Activity Tolerance  Patient tolerated treatment well    Behavior During Therapy  [facility] for tasks assessed/performed       Past Medical History:  Diagnosis Date  . Allergy   . Anxiety   . Arthritis    PSORIATIC  . Depression   . GERD (gastroesophageal reflux disease)    RARE-NO MEDS  . History of kidney stones 2004  . Hypothyroidism   . PONV (postoperative nausea and vomiting)    WITH KIDNEY STONE AND TONSILLECTOMY BUT HAD NO N/V WITH HER LAST FOOT SURGERY IN 2013    Past Surgical History:  Procedure Laterality Date  . FOOT SURGERY Left   . kidney stones removed  2004  . TARSAL TUNNEL RELEASE Left 04/16/2017   Procedure: TARSAL TUNNEL RELEASE;  Surgeon: Samara Deist, DPM;  Location: [facility] ORS;  Service: Podiatry;  Laterality: Left;  . TENDON REPAIR Left 04/16/2017   Procedure: FLEXOR TENDON REPAIR-SECONDARY ;  Surgeon: Samara Deist, DPM;  Location: [facility] OR[facility];  Service: Podiatry;  Laterality: Left;  . tonsillectomy and adenoidectomy  1993      [facility] PT Assessment - 04/06/18 1524      Assessment   Medical Diagnosis  Chronic R knee pain    Referring Provider  Leim Fabry    Onset Date/Surgical Date  12/15/17    Hand Dominance  Right    Prior Therapy  yes, ankle      Precautions   Precautions  None      Restrictions   Weight Bearing Restrictions  No      Balance Screen   Has the patient fallen in the past 6 months  No    Has the  patient had a decrease in activity level because of a fear of falling?   Yes    Is the patient reluctant to leave their home because of a fear of falling?   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    Leisure  boot camp, exercise      Cognition   Overall Cognitive Status  Within Functional Limits for tasks assessed      Observation/Other Assessments-Edema    Edema  --   mod R  knee     ROM / Strength   AROM / PROM / Strength  AROM;Strength      AROM   Overall AROM   Deficits    AROM Assessment Site  Hip;Knee    Right/Left Hip  Right;Left    Right Hip Extension  10    Right Hip Flexion  100    Right Hip External Rotation   40    Right Hip Internal Rotation   40    Right Hip ABduction  40    Right Hip ADduction  40    Left Hip Extension  30    Left Hip Flexion  100    Left Hip External Rotation   40    Left Hip Internal Rotation   40    Left Hip ABduction  40    Left Hip ADduction  40    Right/Left Knee  Right;Left    Right Knee Extension  -2    Right Knee Flexion  114    Left Knee Extension  0    Left Knee Flexion  112      Strength   Strength Assessment Site  Hip;Knee;Ankle    Right/Left Hip  Right;Left    Right Hip Flexion  5/5    Right Hip Extension  3+/5    Right Hip External Rotation   4/5    Right Hip Internal Rotation  4/5    Right Hip ABduction  3+/5    Right Hip ADduction  3/5    Left Hip Flexion  5/5    Left Hip Extension  4+/5    Left Hip External Rotation  4/5    Left Hip  Internal Rotation  4/5    Left Hip ABduction  4/5    Left Hip ADduction  4/5    Right/Left Knee  Right;Left    Right Knee Flexion  4+/5    Right Knee Extension  4+/5    Left Knee Flexion  4+/5    Left Knee Extension  4+/5    Right/Left Ankle  Right;Left    Right Ankle Dorsiflexion  5/5    Right Ankle Plantar Flexion  5/5    Left Ankle Dorsiflexion  5/5    Left Ankle Plantar Flexion  5/5      Flexibility   Soft Tissue Assessment /Muscle Length  yes    Hamstrings  mod R mild L    Quadriceps  mod R mild L    Piriformis  mod R mild L      Palpation   Patella mobility  hypo/painful R super/infer    Palpation comment  R medial joint line, anterior/posterior, popliteal fossa, patellar tendon, R greater trochanter, glute med      Ambulation/Gait   Gait Comments  slightly antalgic on R, wide BOS        Therapeutic exercise: Patient performed with instruction, verbal cues, tactile cues of therapist: goal: improve LE strength. Mobility, tissue length  Supine quad set 10x5"  Prone quad stretch with strap x30sec S/l hip abduction b/l x15 ea Supine piriformis stretch x 30sec b/l Seated HS stretch x30sec b/l Standing heel raises x15 Standing hip extension b/l x15 gastroc and soleus stretch b/l x20sec ea   Patient response to treatment: patient demonstrated improved technique with exercises with repeated demonstration and VC. No complaints of increased pain at end of session.    Objective measurements completed on examination: See above findings.     PT Education - 04/06/18 1624  Education provided  Yes    Education Details  HEP    Person(s) Educated  Patient    Methods  Explanation;Handout;Verbal cues;Demonstration;Tactile cues    Comprehension  Verbalized understanding;Verbal cues required;Returned demonstration       PT Short Term Goals - 04/06/18 1631      PT SHORT TERM GOAL #1   Title  Patient will report adherence ot HEP at least x3 a week in prep for self  management of condition.    Time  2    Period  Weeks    Status  New    Target Date  04/20/18        PT Long Term Goals - 04/06/18 1632      PT LONG TERM GOAL #1   Title  Patient will be independent with HEP focused on improving knee function to continue benefits of therapy after discharge.    Time  4    Period  Weeks    Status  New    Target Date  05/04/18      PT LONG TERM GOAL #2   Title  Patient will report pain with walking/standing for 1 hr to be 3 or less to promote ability to perform functional activities/work duties.    Baseline  unable    Time  4    Period  Weeks    Status  New    Target Date  05/04/18      PT LONG TERM GOAL #3   Title  Patient will demonstrate improved ability to tolerate and perform functional activities demonstrated through improvement in FOTO score by at least 10 points.    Baseline  36/100    Time  4    Period  Weeks    Status  New    Target Date  05/04/18      PT LONG TERM GOAL #4   Title  Patient will demonstrate LE strength of at least 4+/5 to improve ease performing functional/recreational tasks.    Time  4    Period  Weeks    Status  New    Target Date  05/04/18             Plan - 04/06/18 1625    Clinical Impression Statement  Upon assessment patient demonstrates pain with R knee ROM, decreased LE strength, impaired soft tissue integrity as well as edema of RLE, decreased endurance and activity tolerance as well as impaired gait and mobility. These deficits impact the patients ability to perform functional activities. The patient would benefit from further skilled PT intervention to maximize strength, endurance, ROM, and pain management to improve functional abilities.    History and Personal Factors relevant to plan of care:  decreased strength & endurance, complex tear of medial meniscus, hx of L ankle surgery, psoriatic arthritis    Clinical Presentation  Stable    Clinical Presentation due to:  patient symptoms unchanging     Clinical Decision Making  Low    Rehab Potential  Good    Clinical Impairments Affecting Rehab Potential  (+) highly motivated (-) pain aggrvation with weight bearing on LEs    PT Frequency  2x / week    PT Duration  4 weeks    PT Treatment/Interventions  Electrical Stimulation;Moist Heat;Fluidtherapy;Stair training;Therapeutic activities;Neuromuscular re-education;Patient/family education;Manual techniques;Passive range of motion;Energy conservation;Therapeutic exercise;Balance training;Functional mobility training;Ultrasound;Gait training;Aquatic Therapy    PT Next Visit Plan  strengthen b/l hips, pain management modalities, quad strengthening, eccentric strengthening    PT  Home Exercise Plan  HS, quad, calf stretches, quad set,heel raises, hip extension, s/l hip abduction,    Consulted and Agree with Plan of Care  Patient       Patient will benefit from skilled therapeutic intervention in order to improve the following deficits and impairments:  Abnormal gait, Decreased activity tolerance, Decreased endurance, Decreased range of motion, Decreased strength, Pain, Postural dysfunction, Obesity, Impaired flexibility, Increased edema, Difficulty walking, Decreased mobility, Decreased balance  Visit Diagnosis: Muscle weakness (generalized)  Chronic pain of right knee     Problem List Patient Active Problem List   Diagnosis Date Noted  . Recurrent major depressive disorder, in full remission ([place]) 01/14/2018  . Abnormal blood chemistry 01/28/2015  . Allergic rhinitis 01/28/2015  . Anxiety 01/28/2015  . Chest pain 01/28/2015  . Family planning 01/28/2015  . Acid reflux 01/28/2015  . LBP (low back pain) 01/28/2015  . Plantar fasciitis 01/28/2015  . Borderline diabetes 01/28/2015  . Psoriasis 01/28/2015  . Avitaminosis D 01/28/2015  . Disease of white blood cells 11/22/2009  . Awareness of heartbeats 03/23/2008  . Adult hypothyroidism 03/14/2008  . History of tobacco use  11/30/2007  . Calculus of kidney 11/23/2007  . Arthritis with psoriasis ([place]) 12/03/2005  . Clinical depression 03/04/2001  . Morbidly obese ([place]) 08/10/1998    Lieutenant Diego PT, DPT 4:39 PM,04/06/18 [phone]  [facility] [address] [place], [address] Phone: [phone]   Fax:  [phone]  Name: BERYLE BAGSBY MRN: [id number] Date of Birth: Mar 24, 1976

## 2018-04-13 ENCOUNTER — Other Ambulatory Visit: Payer: Self-pay | Admitting: Pharmacist

## 2018-04-13 MED ORDER — ADALIMUMAB 40 MG/0.8ML ~~LOC~~ AJKT: 1.0000 "pen " | AUTO-INJECTOR | SUBCUTANEOUS | 1 refills | Status: DC

## 2018-04-18 ENCOUNTER — Ambulatory Visit: Payer: 59 | Attending: Orthopedic Surgery

## 2018-04-18 DIAGNOSIS — M25561 Pain in right knee: Secondary | ICD-10-CM | POA: Diagnosis not present

## 2018-04-18 DIAGNOSIS — M25572 Pain in left ankle and joints of left foot: Secondary | ICD-10-CM | POA: Diagnosis not present

## 2018-04-18 DIAGNOSIS — M6281 Muscle weakness (generalized): Secondary | ICD-10-CM | POA: Diagnosis not present

## 2018-04-18 DIAGNOSIS — M25672 Stiffness of left ankle, not elsewhere classified: Secondary | ICD-10-CM | POA: Insufficient documentation

## 2018-04-18 DIAGNOSIS — G8929 Other chronic pain: Secondary | ICD-10-CM

## 2018-04-18 MED FILL — HUMIRA PEN 40 MG/0.8ML PNKT: 40 | 28 days supply | Qty: 2 | Fill #0

## 2018-04-18 NOTE — Therapy (Signed)
[facility] [address] [place], [address] Phone: [phone]   Fax:  [phone]  Physical Therapy Treatment  Patient Details  Name: Kelsey Randolph MRN: [id number] Date of Birth: July 07, 1976 Referring Provider: Leim Fabry   Encounter Date: 04/18/2018  PT End of Session - 04/18/18 1310    Visit Number  2    Number of Visits  8    Date for PT Re-Evaluation  05/04/18    PT Start Time  2353    PT Stop Time  1345    PT Time Calculation (min)  40 min    Activity Tolerance  Patient tolerated treatment well    Behavior During Therapy  [facility] for tasks assessed/performed       Past Medical History:  Diagnosis Date  . Allergy   . Anxiety   . Arthritis    PSORIATIC  . Depression   . GERD (gastroesophageal reflux disease)    RARE-NO MEDS  . History of kidney stones 2004  . Hypothyroidism   . PONV (postoperative nausea and vomiting)    WITH KIDNEY STONE AND TONSILLECTOMY BUT HAD NO N/V WITH HER LAST FOOT SURGERY IN 2013    Past Surgical History:  Procedure Laterality Date  . FOOT SURGERY Left   . kidney stones removed  2004  . TARSAL TUNNEL RELEASE Left 04/16/2017   Procedure: TARSAL TUNNEL RELEASE;  Surgeon: Samara Deist, DPM;  Location: [facility];  Service: Podiatry;  Laterality: Left;  . TENDON REPAIR Left 04/16/2017   Procedure: FLEXOR TENDON REPAIR-SECONDARY ;  Surgeon: Samara Deist, DPM;  Location: [facility];  Service: Podiatry;  Laterality: Left;  . tonsillectomy and adenoidectomy  1993    There were no vitals filed for this visit.  Subjective Assessment - 04/18/18 1307    Subjective  Pt reports that she had a lot of pain in her R knee over the weekend but it "feels alright" right now. She complains of 4/10 R knee pain upon arrival. She reports that the exercises seemed to help however she did not do the prone quad stretch as she couldn't perform correctly.     Pertinent History  Patient is 42 yo  female that reports R knee pain that increased 3 months ago. States she felt a pop during an exercise class getting up from the floor. Reports initially had knee pain on and off for the last 1.5 years due to NWB precautions on LLE and relying on RLE. R knee pain has worsened since a vacation that involved a lot of walking, and has had x2 injection and a 2 aspirations. MRI results: Complex tear of the posterior horn with radial and horizontal components. Mild tricompartmental osteoarthritis. Patient reports difficulty with performing recreational activities to maintain health and decreased ability to perform functional activities such as steps, walking, standing, transfers. Also complains of new onset of R hip pain in the last 2-3weeks.     Limitations  Lifting;Standing;Walking;House hold activities;Sitting    How long can you walk comfortably?  22mins    Diagnostic tests  MRI     Patient Stated Goals  Wants to build strength and improve ability to perform exercises and functional activities.     Currently in Pain?  Yes    Pain Score  4     Pain Location  Knee    Pain Orientation  Right    Pain Descriptors / Indicators  Constant    Pain Type  Chronic  pain    Pain Onset  More than a month ago       TREATMENT  Therapeutic exercise: Patient performed with instruction, verbal cues, tactile cues of therapist: goal: improve LE strength. Mobility, tissue length Seated R HS stretch with leg up on table 30sec x 3, also practiced with foot on ground and on stool but pt able to get better stretch up on table; Standing heel raises bilateral x 15; Standing hip extension RLE only x 15; Supine quad set 5s x 10; Supine SLR with quad set before hip flexion x 10; Attempted supine piriformis stretch but pt reports increase in knee pain so discontinued; Pt reports inability to perform prone quad stretch and she is unable to complete in sidelying with therapist so discontinued; Hooklying bridges 2 x  10; Sidelying hip abduction RLE 2 x 10; gastroc and soleus stretch bilateral 30s x 2 each;  Manual Therapy Extensive STM to R quad muscle with notable trigger point in medial quads x 10 minutes. Pt reports very significant improvement in pain following;  Patient response to treatment: patient demonstrated improved technique with exercises with repeated demonstration and VC. No complaints of increased pain at end of session.                      PT Education - 04/18/18 1309    Education provided  Yes    Education Details  exercise form/technique, modification to HEP    Person(s) Educated  Patient    Methods  Explanation    Comprehension  Verbalized understanding       PT Short Term Goals - 04/06/18 1631      PT SHORT TERM GOAL #1   Title  Patient will report adherence ot HEP at least x3 a week in prep for self management of condition.    Time  2    Period  Weeks    Status  New    Target Date  04/20/18        PT Long Term Goals - 04/06/18 1632      PT LONG TERM GOAL #1   Title  Patient will be independent with HEP focused on improving knee function to continue benefits of therapy after discharge.    Time  4    Period  Weeks    Status  New    Target Date  05/04/18      PT LONG TERM GOAL #2   Title  Patient will report pain with walking/standing for 1 hr to be 3 or less to promote ability to perform functional activities/work duties.    Baseline  unable    Time  4    Period  Weeks    Status  New    Target Date  05/04/18      PT LONG TERM GOAL #3   Title  Patient will demonstrate improved ability to tolerate and perform functional activities demonstrated through improvement in FOTO score by at least 10 points.    Baseline  36/100    Time  4    Period  Weeks    Status  New    Target Date  05/04/18      PT LONG TERM GOAL #4   Title  Patient will demonstrate LE strength of at least 4+/5 to improve ease performing functional/recreational tasks.     Time  4    Period  Weeks    Status  New    Target Date  05/04/18  Plan - 04/18/18 1310    Clinical Impression Statement  Pt is unable to perform quad stretch so discontinued today. She also reports increase in knee pain with supine piriformis stretch so discontinued this as well. Pt reports very significant improvement with STM to medial R quadricep muscle during session today. She is able to perform all exercises as intructed. Pt encourage to continue HEP and follow-up as scheduled.     Rehab Potential  Good    Clinical Impairments Affecting Rehab Potential  (+) highly motivated (-) pain aggrvation with weight bearing on LEs    PT Frequency  2x / week    PT Duration  4 weeks    PT Treatment/Interventions  Electrical Stimulation;Moist Heat;Fluidtherapy;Stair training;Therapeutic activities;Neuromuscular re-education;Patient/family education;Manual techniques;Passive range of motion;Energy conservation;Therapeutic exercise;Balance training;Functional mobility training;Ultrasound;Gait training;Aquatic Therapy    PT Next Visit Plan  strengthen b/l hips, pain management modalities, quad strengthening, STM to quads, consider recert for dry needling if continues to experience benefit with trigger point release in medial quadricep    PT Home Exercise Plan  HS, calf stretches, quad set,heel raises, hip extension, s/l hip abduction, 04/18/18: removed quad and piriformis stretches    Consulted and Agree with Plan of Care  Patient       Patient will benefit from skilled therapeutic intervention in order to improve the following deficits and impairments:  Abnormal gait, Decreased activity tolerance, Decreased endurance, Decreased range of motion, Decreased strength, Pain, Postural dysfunction, Obesity, Impaired flexibility, Increased edema, Difficulty walking, Decreased mobility, Decreased balance  Visit Diagnosis: Muscle weakness (generalized)  Chronic pain of right knee     Problem  List Patient Active Problem List   Diagnosis Date Noted  . Recurrent major depressive disorder, in full remission ([place]) 01/14/2018  . Abnormal blood chemistry 01/28/2015  . Allergic rhinitis 01/28/2015  . Anxiety 01/28/2015  . Chest pain 01/28/2015  . Family planning 01/28/2015  . Acid reflux 01/28/2015  . LBP (low back pain) 01/28/2015  . Plantar fasciitis 01/28/2015  . Borderline diabetes 01/28/2015  . Psoriasis 01/28/2015  . Avitaminosis D 01/28/2015  . Disease of white blood cells 11/22/2009  . Awareness of heartbeats 03/23/2008  . Adult hypothyroidism 03/14/2008  . History of tobacco use 11/30/2007  . Calculus of kidney 11/23/2007  . Arthritis with psoriasis ([place]) 12/03/2005  . Clinical depression 03/04/2001  . Morbidly obese ([place]) 08/10/1998   Kelsey Randolph PT, DPT, GCS  Kelsey Randolph 04/18/2018, 4:32 PM  [facility] [address], [place], [address] Phone: [phone]   Fax:  [phone]  Name: Kelsey Randolph MRN: [id number] Date of Birth: [birth year]

## 2018-04-21 ENCOUNTER — Ambulatory Visit: Payer: 59

## 2018-04-21 DIAGNOSIS — M25572 Pain in left ankle and joints of left foot: Secondary | ICD-10-CM | POA: Diagnosis not present

## 2018-04-21 DIAGNOSIS — M25561 Pain in right knee: Secondary | ICD-10-CM | POA: Diagnosis not present

## 2018-04-21 DIAGNOSIS — G8929 Other chronic pain: Secondary | ICD-10-CM | POA: Diagnosis not present

## 2018-04-21 DIAGNOSIS — M6281 Muscle weakness (generalized): Secondary | ICD-10-CM

## 2018-04-21 DIAGNOSIS — M25672 Stiffness of left ankle, not elsewhere classified: Secondary | ICD-10-CM | POA: Diagnosis not present

## 2018-04-21 NOTE — Therapy (Signed)
[facility] [address], [place], [address] Phone: [phone]   Fax:  [phone]  Physical Therapy Treatment  Patient Details  Name: Kelsey Randolph MRN: [id number] Date of Birth: April 01, 1976 Referring Provider: Leim Fabry   Encounter Date: 04/21/2018  PT End of Session - 04/21/18 1159    Visit Number  3    Number of Visits  8    Date for PT Re-Evaluation  05/04/18    PT Start Time  1117    PT Stop Time  1156    PT Time Calculation (min)  39 min    Activity Tolerance  Patient tolerated treatment well    Behavior During Therapy  [facility] for tasks assessed/performed       Past Medical History:  Diagnosis Date  . Allergy   . Anxiety   . Arthritis    PSORIATIC  . Depression   . GERD (gastroesophageal reflux disease)    RARE-NO MEDS  . History of kidney stones 2004  . Hypothyroidism   . PONV (postoperative nausea and vomiting)    WITH KIDNEY STONE AND TONSILLECTOMY BUT HAD NO N/V WITH HER LAST FOOT SURGERY IN 2013    Past Surgical History:  Procedure Laterality Date  . FOOT SURGERY Left   . kidney stones removed  2004  . TARSAL TUNNEL RELEASE Left 04/16/2017   Procedure: TARSAL TUNNEL RELEASE;  Surgeon: Samara Deist, DPM;  Location: [facility] ORS;  Service: Podiatry;  Laterality: Left;  . TENDON REPAIR Left 04/16/2017   Procedure: FLEXOR TENDON REPAIR-SECONDARY ;  Surgeon: Samara Deist, DPM;  Location: [facility] ORS;  Service: Podiatry;  Laterality: Left;  . tonsillectomy and adenoidectomy  1993    There were no vitals filed for this visit.  Subjective Assessment - 04/21/18 1119    Subjective  Patient reports it is always a constant 3-4/10.     Patient Stated Goals  Wants to build strength and improve ability to perform exercises and functional activities.     Currently in Pain?  Yes    Pain Score  4     Pain Orientation  Right    Pain Onset  More than a month ago         Therapeutic  exercise: Patient performed with instruction, verbal cues, tactile cues of therapist: goal:improve LE strength. Mobility, tissue length  Seated R HS stretch with leg up on table 30sec x 3,  Standing heel raises bilateral x 20; Standing hip extension RLE only x 15; Supine quad set with towel under ankle 5s x 10; Supine SLR with quad set before hip flexion x 10; Supine piriformis stretch 3x20" no pain reported this session Hooklying bridges 2 x 10;  Sidelying hip abduction b/l 2 x 10; Supine quad stretch on RLE with towel, leg hanging off mat 3x20" sec Hamstring Isometrics in varying degrees of knee flexion x 15x5" Standing FT EC on foam 2x20"sec, EO on foam x20"sec, vertical head turns on foam 2x20"sec Sit to stands x15 from elevated surface  Manual Therapy Extensive STM/IASTM to R quad muscle with notable trigger point in medial quads x 10 minutes. Pt reports very significant improvement in pain following; Patient taught about self mobilization at home.  Patient response to treatment:patient demonstrated improved technique with exercises with repeated demonstration and VC. No complaints of increased pain at end of session.     PT Education - 04/21/18 1119    Education provided  Yes  Education Details  exercises form/technique     Person(s) Educated  Patient    Methods  Explanation    Comprehension  Verbalized understanding       PT Short Term Goals - 04/21/18 1157      PT SHORT TERM GOAL #1   Title  Patient will report adherence ot HEP at least x3 a week in prep for self management of condition.    Time  2    Period  Weeks    Status  Achieved        PT Long Term Goals - 04/06/18 1632      PT LONG TERM GOAL #1   Title  Patient will be independent with HEP focused on improving knee function to continue benefits of therapy after discharge.    Time  4    Period  Weeks    Status  New    Target Date  05/04/18      PT LONG TERM GOAL #2   Title  Patient will report  pain with walking/standing for 1 hr to be 3 or less to promote ability to perform functional activities/work duties.    Baseline  unable    Time  4    Period  Weeks    Status  New    Target Date  05/04/18      PT LONG TERM GOAL #3   Title  Patient will demonstrate improved ability to tolerate and perform functional activities demonstrated through improvement in FOTO score by at least 10 points.    Baseline  36/100    Time  4    Period  Weeks    Status  New    Target Date  05/04/18      PT LONG TERM GOAL #4   Title  Patient will demonstrate LE strength of at least 4+/5 to improve ease performing functional/recreational tasks.    Time  4    Period  Weeks    Status  New    Target Date  05/04/18            Plan - 04/21/18 1158    Clinical Impression Statement  Patient reports pain relief with STM/IASTM to medial quad today. Able to perform quad stretch in supine with leg off the table as well as piriformis stretch without pain this session. No complaints of increased pain at end of session.    PT Frequency  2x / week    PT Duration  4 weeks    PT Treatment/Interventions  Electrical Stimulation;Moist Heat;Fluidtherapy;Stair training;Therapeutic activities;Neuromuscular re-education;Patient/family education;Manual techniques;Passive range of motion;Energy conservation;Therapeutic exercise;Balance training;Functional mobility training;Ultrasound;Gait training;Aquatic Therapy       Patient will benefit from skilled therapeutic intervention in order to improve the following deficits and impairments:  Abnormal gait, Decreased activity tolerance, Decreased endurance, Decreased range of motion, Decreased strength, Pain, Postural dysfunction, Obesity, Impaired flexibility, Increased edema, Difficulty walking, Decreased mobility, Decreased balance  Visit Diagnosis: Chronic pain of right knee  Muscle weakness (generalized)  Pain in left ankle and joints of left foot  Stiffness of left  ankle, not elsewhere classified     Problem List Patient Active Problem List   Diagnosis Date Noted  . Recurrent major depressive disorder, in full remission ([place]) 01/14/2018  . Abnormal blood chemistry 01/28/2015  . Allergic rhinitis 01/28/2015  . Anxiety 01/28/2015  . Chest pain 01/28/2015  . Family planning 01/28/2015  . Acid reflux 01/28/2015  . LBP (low back pain) 01/28/2015  . Plantar fasciitis  01/28/2015  . Borderline diabetes 01/28/2015  . Psoriasis 01/28/2015  . Avitaminosis D 01/28/2015  . Disease of white blood cells 11/22/2009  . Awareness of heartbeats 03/23/2008  . Adult hypothyroidism 03/14/2008  . History of tobacco use 11/30/2007  . Calculus of kidney 11/23/2007  . Arthritis with psoriasis ([place]) 12/03/2005  . Clinical depression 03/04/2001  . Morbidly obese ([place]) 08/10/1998    Lieutenant Diego PT, DPT 12:00 PM,04/21/18 [phone]  [facility] [address], [place], [address] Phone: [phone]   Fax:  [phone]  Name: Kelsey Randolph MRN: [id number] Date of Birth: Apr 26, 1976

## 2018-04-26 ENCOUNTER — Ambulatory Visit: Payer: 59

## 2018-04-26 DIAGNOSIS — M25572 Pain in left ankle and joints of left foot: Secondary | ICD-10-CM | POA: Diagnosis not present

## 2018-04-26 DIAGNOSIS — M6281 Muscle weakness (generalized): Secondary | ICD-10-CM | POA: Diagnosis not present

## 2018-04-26 DIAGNOSIS — M25672 Stiffness of left ankle, not elsewhere classified: Secondary | ICD-10-CM

## 2018-04-26 DIAGNOSIS — G8929 Other chronic pain: Secondary | ICD-10-CM

## 2018-04-26 DIAGNOSIS — M25561 Pain in right knee: Principal | ICD-10-CM

## 2018-04-26 NOTE — Therapy (Signed)
[facility] [address], [place], [address] Phone: [phone]   Fax:  [phone]  Physical Therapy Treatment  Patient Details  Name: Kelsey Randolph MRN: [id number] Date of Birth: 1976/05/02 Referring Provider: Leim Fabry   Encounter Date: 04/26/2018  PT End of Session - 04/26/18 1518    Visit Number  4    Number of Visits  8    Date for PT Re-Evaluation  05/04/18    PT Start Time  1518    PT Stop Time  1601    PT Time Calculation (min)  43 min    Activity Tolerance  Patient tolerated treatment well    Behavior During Therapy  [facility] for tasks assessed/performed       Past Medical History:  Diagnosis Date  . Allergy   . Anxiety   . Arthritis    PSORIATIC  . Depression   . GERD (gastroesophageal reflux disease)    RARE-NO MEDS  . History of kidney stones 2004  . Hypothyroidism   . PONV (postoperative nausea and vomiting)    WITH KIDNEY STONE AND TONSILLECTOMY BUT HAD NO N/V WITH HER LAST FOOT SURGERY IN 2013    Past Surgical History:  Procedure Laterality Date  . FOOT SURGERY Left   . kidney stones removed  2004  . TARSAL TUNNEL RELEASE Left 04/16/2017   Procedure: TARSAL TUNNEL RELEASE;  Surgeon: Samara Deist, DPM;  Location: [facility];  Service: Podiatry;  Laterality: Left;  . TENDON REPAIR Left 04/16/2017   Procedure: FLEXOR TENDON REPAIR-SECONDARY ;  Surgeon: Samara Deist, DPM;  Location: [facility];  Service: Podiatry;  Laterality: Left;  . tonsillectomy and adenoidectomy  1993    There were no vitals filed for this visit.  Subjective Assessment - 04/26/18 1519    Subjective  Patient reports her "pinch" is still present today.     Currently in Pain?  Yes    Pain Score  4     Pain Location  Knee    Pain Orientation  Right;Medial    Pain Descriptors / Indicators  Other (Comment)   pinch   Pain Type  Acute pain    Pain Onset  More than a month ago         Therapeutic  exercise: Patient performed with instruction, verbal cues, tactile cues of therapist: goal:improve LE strength. Mobility, tissue length, balance  SeatedRHS stretchwith leg up on table30sec x 3,  Standing heel raisesbilateralx 20; Standing hip extension b/l 2#AW x20; Supine SLR with quad set beforehip flexion2x 10 with 2# AW Supine piriformis stretch on RLE 3x30" no pain reported this session Hooklying bridges x25;  Bent knee adductor stretch 3 x30"  Sidelyinghip abduction b/l with 2# AW 2 x 10; Hamstring Isometrics in varying degrees of knee flexion  10x5" Standing FT EC on foam 3x20"sec, 1 foot forward on foam EO 2x20"sec b/l  Sit to stands x15 with 6lb medicine ball  Manual Therapy STM to R quad muscle/adductors/pes anserine this session, with notable trigger point in adductors  x 10 minutes. Pt reports improvement in pain following;   Patient response to treatment:patient demonstrated improved technique with exercises with repeated demonstration and VC. Decreased R knee pain from 4/10 to 2/10.    PT Education - 04/26/18 1602    Education provided  Yes    Education Details  exercise form/techniques    Person(s) Educated  Patient    Methods  Explanation  Comprehension  Verbalized understanding       PT Short Term Goals - 04/21/18 1157      PT SHORT TERM GOAL #1   Title  Patient will report adherence ot HEP at least x3 a week in prep for self management of condition.    Time  2    Period  Weeks    Status  Achieved        PT Long Term Goals - 04/06/18 1632      PT LONG TERM GOAL #1   Title  Patient will be independent with HEP focused on improving knee function to continue benefits of therapy after discharge.    Time  4    Period  Weeks    Status  New    Target Date  05/04/18      PT LONG TERM GOAL #2   Title  Patient will report pain with walking/standing for 1 hr to be 3 or less to promote ability to perform functional activities/work duties.     Baseline  unable    Time  4    Period  Weeks    Status  New    Target Date  05/04/18      PT LONG TERM GOAL #3   Title  Patient will demonstrate improved ability to tolerate and perform functional activities demonstrated through improvement in FOTO score by at least 10 points.    Baseline  36/100    Time  4    Period  Weeks    Status  New    Target Date  05/04/18      PT LONG TERM GOAL #4   Title  Patient will demonstrate LE strength of at least 4+/5 to improve ease performing functional/recreational tasks.    Time  4    Period  Weeks    Status  New    Target Date  05/04/18            Plan - 04/26/18 1602    Clinical Impression Statement  Patient reported improvement in pain from 4/10 to 2/10 this session. Able to progress strengthening program without complaints of pain, fatigued noted at end of session. Most difficulty with balance activities.    PT Frequency  2x / week    PT Duration  4 weeks    PT Treatment/Interventions  Electrical Stimulation;Moist Heat;Fluidtherapy;Stair training;Therapeutic activities;Neuromuscular re-education;Patient/family education;Manual techniques;Passive range of motion;Energy conservation;Therapeutic exercise;Balance training;Functional mobility training;Ultrasound;Gait training;Aquatic Therapy       Patient will benefit from skilled therapeutic intervention in order to improve the following deficits and impairments:  Abnormal gait, Decreased activity tolerance, Decreased endurance, Decreased range of motion, Decreased strength, Pain, Postural dysfunction, Obesity, Impaired flexibility, Increased edema, Difficulty walking, Decreased mobility, Decreased balance  Visit Diagnosis: Chronic pain of right knee  Muscle weakness (generalized)  Pain in left ankle and joints of left foot  Stiffness of left ankle, not elsewhere classified     Problem List Patient Active Problem List   Diagnosis Date Noted  . Recurrent major depressive  disorder, in full remission ([place]) 01/14/2018  . Abnormal blood chemistry 01/28/2015  . Allergic rhinitis 01/28/2015  . Anxiety 01/28/2015  . Chest pain 01/28/2015  . Family planning 01/28/2015  . Acid reflux 01/28/2015  . LBP (low back pain) 01/28/2015  . Plantar fasciitis 01/28/2015  . Borderline diabetes 01/28/2015  . Psoriasis 01/28/2015  . Avitaminosis D 01/28/2015  . Disease of white blood cells 11/22/2009  . Awareness of heartbeats 03/23/2008  .  Adult hypothyroidism 03/14/2008  . History of tobacco use 11/30/2007  . Calculus of kidney 11/23/2007  . Arthritis with psoriasis ([place]) 12/03/2005  . Clinical depression 03/04/2001  . Morbidly obese ([place]) 08/10/1998    Lieutenant Diego PT, DPT 4:07 PM,04/26/18 [facility] [address], [place], [address] Phone: [phone]   Fax:  [phone]  Name: Kelsey Randolph MRN: [id number] Date of Birth: 08/20/1975

## 2018-04-27 ENCOUNTER — Ambulatory Visit: Payer: 59

## 2018-04-27 DIAGNOSIS — M25572 Pain in left ankle and joints of left foot: Secondary | ICD-10-CM | POA: Diagnosis not present

## 2018-04-27 DIAGNOSIS — G8929 Other chronic pain: Secondary | ICD-10-CM

## 2018-04-27 DIAGNOSIS — M25672 Stiffness of left ankle, not elsewhere classified: Secondary | ICD-10-CM | POA: Diagnosis not present

## 2018-04-27 DIAGNOSIS — M25561 Pain in right knee: Principal | ICD-10-CM

## 2018-04-27 DIAGNOSIS — M6281 Muscle weakness (generalized): Secondary | ICD-10-CM

## 2018-04-27 NOTE — Therapy (Signed)
[facility] [address], [place], [address] Phone: [phone]   Fax:  [phone]  Physical Therapy Treatment  Patient Details  Name: Kelsey Randolph MRN: [id number] Date of Birth: 08-07-1976 Referring Provider: Leim Fabry   Encounter Date: 04/27/2018  PT End of Session - 04/27/18 1703    Visit Number  5    Number of Visits  8    Date for PT Re-Evaluation  05/04/18    PT Start Time  1520    PT Stop Time  1558    PT Time Calculation (min)  38 min    Activity Tolerance  Patient tolerated treatment well    Behavior During Therapy  [facility] for tasks assessed/performed       Past Medical History:  Diagnosis Date  . Allergy   . Anxiety   . Arthritis    PSORIATIC  . Depression   . GERD (gastroesophageal reflux disease)    RARE-NO MEDS  . History of kidney stones 2004  . Hypothyroidism   . PONV (postoperative nausea and vomiting)    WITH KIDNEY STONE AND TONSILLECTOMY BUT HAD NO N/V WITH HER LAST FOOT SURGERY IN 2013    Past Surgical History:  Procedure Laterality Date  . FOOT SURGERY Left   . kidney stones removed  2004  . TARSAL TUNNEL RELEASE Left 04/16/2017   Procedure: TARSAL TUNNEL RELEASE;  Surgeon: Samara Deist, DPM;  Location: [facility];  Service: Podiatry;  Laterality: Left;  . TENDON REPAIR Left 04/16/2017   Procedure: FLEXOR TENDON REPAIR-SECONDARY ;  Surgeon: Samara Deist, DPM;  Location: [facility];  Service: Podiatry;  Laterality: Left;  . tonsillectomy and adenoidectomy  1993    There were no vitals filed for this visit.  Subjective Assessment - 04/27/18 1700    Subjective  Patient reports her "pinch" is still present today. Pt reports that she got a "good workout" yesterday and then she went to the gym last night. She rates her right knee pain as 4/10.    Pertinent History  Patient is 42 yo female that reports R knee pain that increased 3 months ago. States she felt a pop during an  exercise class getting up from the floor. Reports initially had knee pain on and off for the last 1.5 years due to NWB precautions on LLE and relying on RLE. R knee pain has worsened since a vacation that involved a lot of walking, and has had x2 injection and a 2 aspirations. MRI results: Complex tear of the posterior horn with radial and horizontal components. Mild tricompartmental osteoarthritis. Patient reports difficulty with performing recreational activities to maintain health and decreased ability to perform functional activities such as steps, walking, standing, transfers. Also complains of new onset of R hip pain in the last 2-3weeks.     Limitations  Lifting;Standing;Walking;House hold activities;Sitting    How long can you walk comfortably?  72mins    Diagnostic tests  MRI     Patient Stated Goals  Wants to build strength and improve ability to perform exercises and functional activities.     Currently in Pain?  Yes    Pain Score  4     Pain Location  Knee    Pain Orientation  Right;Medial    Pain Descriptors / Indicators  --   pinch   Pain Type  Acute pain    Pain Onset  More than a month ago  TREATMENT  Ther-ex  R Quad set with 3s hold x 10; R quad set with SLR x 10; Hooklying RHS stretch 30s hold x 3;  Manual Therapy STM to R quad muscle/adductors/pes anserine this session, with notable trigger point in adductors; R patella inferior, superior, medial, and lateral mobilizations grade III, 30s/bout x 2 bouts; R tibia on femur grade 1 A/P mobilizations 30s/bout x 3 bouts; R tibia on femur grade 1 P/A mobilizations 30s/bout x 3 bouts; Seated R "pump handle" mobilizations with posterior pressure on anteromedial joint line with repeated passive flexion of R knee;  Pt educated throughout session about proper posture and technique with exercises. Improved exercise technique, movement at target joints, use of target muscles after min to mod verbal, visual, tactile cues.     Patient reports improvement in pain during session and is less tender to soft tissue mobilization than during previous sessions. No pain reported with strengthening. Pt encouraged to continue HEP and follow-up as scheduled.                       PT Short Term Goals - 04/21/18 1157      PT SHORT TERM GOAL #1   Title  Patient will report adherence ot HEP at least x3 a week in prep for self management of condition.    Time  2    Period  Weeks    Status  Achieved        PT Long Term Goals - 04/06/18 1632      PT LONG TERM GOAL #1   Title  Patient will be independent with HEP focused on improving knee function to continue benefits of therapy after discharge.    Time  4    Period  Weeks    Status  New    Target Date  05/04/18      PT LONG TERM GOAL #2   Title  Patient will report pain with walking/standing for 1 hr to be 3 or less to promote ability to perform functional activities/work duties.    Baseline  unable    Time  4    Period  Weeks    Status  New    Target Date  05/04/18      PT LONG TERM GOAL #3   Title  Patient will demonstrate improved ability to tolerate and perform functional activities demonstrated through improvement in FOTO score by at least 10 points.    Baseline  36/100    Time  4    Period  Weeks    Status  New    Target Date  05/04/18      PT LONG TERM GOAL #4   Title  Patient will demonstrate LE strength of at least 4+/5 to improve ease performing functional/recreational tasks.    Time  4    Period  Weeks    Status  New    Target Date  05/04/18            Plan - 04/27/18 1703    Clinical Impression Statement  Patient reports improvement in pain during session and is less tender to soft tissue mobilization than during previous sessions. No pain reported with strengthening. Pt encouraged to continue HEP and follow-up as scheduled.     PT Frequency  2x / week    PT Duration  4 weeks    PT Treatment/Interventions   Electrical Stimulation;Moist Heat;Fluidtherapy;Stair training;Therapeutic activities;Neuromuscular re-education;Patient/family education;Manual techniques;Passive range of motion;Energy conservation;Therapeutic exercise;Balance training;Functional  mobility training;Ultrasound;Gait training;Aquatic Therapy    PT Next Visit Plan  strengthen b/l hips, pain management modalities, quad strengthening, STM to quads, consider recert for dry needling if continues to experience benefit with trigger point release in medial quadricep    PT Home Exercise Plan  HS, calf stretches, quad set,heel raises, hip extension, s/l hip abduction, 04/18/18: removed quad and piriformis stretches       Patient will benefit from skilled therapeutic intervention in order to improve the following deficits and impairments:  Abnormal gait, Decreased activity tolerance, Decreased endurance, Decreased range of motion, Decreased strength, Pain, Postural dysfunction, Obesity, Impaired flexibility, Increased edema, Difficulty walking, Decreased mobility, Decreased balance  Visit Diagnosis: Chronic pain of right knee  Muscle weakness (generalized)     Problem List Patient Active Problem List   Diagnosis Date Noted  . Recurrent major depressive disorder, in full remission ([place]) 01/14/2018  . Abnormal blood chemistry 01/28/2015  . Allergic rhinitis 01/28/2015  . Anxiety 01/28/2015  . Chest pain 01/28/2015  . Family planning 01/28/2015  . Acid reflux 01/28/2015  . LBP (low back pain) 01/28/2015  . Plantar fasciitis 01/28/2015  . Borderline diabetes 01/28/2015  . Psoriasis 01/28/2015  . Avitaminosis D 01/28/2015  . Disease of white blood cells 11/22/2009  . Awareness of heartbeats 03/23/2008  . Adult hypothyroidism 03/14/2008  . History of tobacco use 11/30/2007  . Calculus of kidney 11/23/2007  . Arthritis with psoriasis ([place]) 12/03/2005  . Clinical depression 03/04/2001  . Morbidly obese ([place]) 08/10/1998   Lyndel Safe  Huprich PT, DPT, GCS  Huprich,Jason 04/27/2018, 5:31 PM  [facility] [address] [place], [address] Phone: [phone]   Fax:  [phone]  Name: Kelsey Randolph MRN: [id number] Date of Birth: Mar 09, 1976

## 2018-05-03 ENCOUNTER — Ambulatory Visit: Payer: 59

## 2018-05-03 DIAGNOSIS — M25561 Pain in right knee: Principal | ICD-10-CM

## 2018-05-03 DIAGNOSIS — G8929 Other chronic pain: Secondary | ICD-10-CM

## 2018-05-03 DIAGNOSIS — M6281 Muscle weakness (generalized): Secondary | ICD-10-CM

## 2018-05-03 NOTE — Therapy (Signed)
[facility] [address] [place], [address] Phone: [phone]   Fax:  [phone]  Physical Therapy Treatment  Patient Details  Name: Kelsey Randolph MRN: [id number] Date of Birth: December 01, 1975 Referring Provider: Leim Fabry   Encounter Date: 05/03/2018  PT End of Session - 05/03/18 1518    Visit Number  6    Number of Visits  8    Date for PT Re-Evaluation  05/04/18    PT Start Time  1519    PT Stop Time  1600    PT Time Calculation (min)  41 min    Activity Tolerance  Patient tolerated treatment well    Behavior During Therapy  [facility] for tasks assessed/performed       Past Medical History:  Diagnosis Date  . Allergy   . Anxiety   . Arthritis    PSORIATIC  . Depression   . GERD (gastroesophageal reflux disease)    RARE-NO MEDS  . History of kidney stones 2004  . Hypothyroidism   . PONV (postoperative nausea and vomiting)    WITH KIDNEY STONE AND TONSILLECTOMY BUT HAD NO N/V WITH HER LAST FOOT SURGERY IN 2013    Past Surgical History:  Procedure Laterality Date  . FOOT SURGERY Left   . kidney stones removed  2004  . TARSAL TUNNEL RELEASE Left 04/16/2017   Procedure: TARSAL TUNNEL RELEASE;  Surgeon: Samara Deist, DPM;  Location: [facility] ORS;  Service: Podiatry;  Laterality: Left;  . TENDON REPAIR Left 04/16/2017   Procedure: FLEXOR TENDON REPAIR-SECONDARY ;  Surgeon: Samara Deist, DPM;  Location: [facility] ORS;  Service: Podiatry;  Laterality: Left;  . tonsillectomy and adenoidectomy  1993    There were no vitals filed for this visit.  Subjective Assessment - 05/03/18 1519    Subjective  Patient reports her current pain is about 2/10. States that Thursday-Saturday her pain was much better than it usually is. States her hip is much better as well. States 50% improvement since starting therapy. Reports hip pain and knee pain, reports that the knee feels better, states she feels stronger.     Pertinent  History  Patient is 42 yo female that reports R knee pain that increased 3 months ago. States she felt a pop during an exercise class getting up from the floor. Reports initially had knee pain on and off for the last 1.5 years due to NWB precautions on LLE and relying on RLE. R knee pain has worsened since a vacation that involved a lot of walking, and has had x2 injection and a 2 aspirations. MRI results: Complex tear of the posterior horn with radial and horizontal components. Mild tricompartmental osteoarthritis. Patient reports difficulty with performing recreational activities to maintain health and decreased ability to perform functional activities such as steps, walking, standing, transfers. Also complains of new onset of R hip pain in the last 2-3weeks.     Patient Stated Goals  Patient wants to improve endurance and pain relief    Currently in Pain?  Yes    Pain Score  2     Pain Location  Knee   some into the R LLE   Pain Orientation  Right;Medial    Pain Descriptors / Indicators  Other (Comment)   pinch   Pain Onset  More than a month ago    Aggravating Factors   weight bearing         05/03/18 0001  Observation/Other Assessments  Focus on Therapeutic Outcomes (FOTO)  38  ROM / Strength  AROM / PROM / Strength AROM;Strength  AROM  Left Knee Flexion 112  Left Knee Extension 0  Overall AROM  Deficits  AROM Assessment Site Hip;Knee  Right/Left Knee Right;Left  Right Knee Extension -2  Right Knee Flexion 114  Strength  Left Knee Flexion 4+/5  Left Knee Extension 4+/5  Right Hip Flexion 5/5  Right Hip Extension 4/5  Right Hip External Rotation  4/5  Right Hip Internal Rotation 4/5  Right Hip ABduction 4-/5  Left Hip Flexion 5/5  Left Hip Extension 4+/5  Left Hip External Rotation 4/5  Left Hip Internal Rotation 4/5  Left Hip ABduction 4/5  Left Hip ADduction 4/5  Right Knee Flexion 4+/5  Right Knee Extension 4+/5  Right Ankle Dorsiflexion 5/5  Right Ankle Plantar  Flexion 5/5  Left Ankle Dorsiflexion 5/5  Left Ankle Plantar Flexion 5/5  Strength Assessment Site Hip;Knee;Ankle  Right/Left Hip Left;Right  Right Hip ADduction 4-/5  Palpation  Palpation comment R medial joint line, medial calf, pes anserine  Ambulation/Gait  Gait Comments WFLs    TREATMENT  Ther-ex: Patient performed with close supervision, verbal cues/demo from PT Improvement in form with repetitions observed. Calf stretch 3x30s on R Soleus stretch 3x30s on R Foam EC FT 3 x 30" Foam feet apart EC 3 x 30" b/l  Mini squats to table with butt touches x20 Standing hip extension/abduction with 2# AW x15  Manual Therapy x 83mins STM to R posterior calf with trigger pointing of medial gastroc; Improvement in soft tissue integrity, patient reported feeling looser after.    PT Education - 05/03/18 1517    Education provided  Yes    Education Details  therex, POC, condition    Person(s) Educated  Patient    Methods  Explanation;Demonstration;Tactile cues    Comprehension  Verbalized understanding;Returned demonstration       PT Short Term Goals - 04/21/18 1157      PT SHORT TERM GOAL #1   Title  Patient will report adherence ot HEP at least x3 a week in prep for self management of condition.    Time  2    Period  Weeks    Status  Achieved        PT Long Term Goals - 05/03/18 1523      PT LONG TERM GOAL #1   Title  Patient will be independent with HEP focused on improving knee function to continue benefits of therapy after discharge.    Baseline  Patient reports adherence to HEP, final HEP still to be administered    Time  2    Period  Weeks    Status  On-going    Target Date  05/17/18      PT LONG TERM GOAL #2   Title  Patient will report pain with walking/standing for 1 hr to be 3 or less to promote ability to perform functional activities/work duties.    Baseline  Patient reports that she can do 22 minutes of a walk currently. at work 20-25 minutes.    Time   2    Period  Weeks    Status  New    Target Date  05/17/18      PT LONG TERM GOAL #3   Title  Patient will demonstrate improved ability to tolerate and perform functional activities demonstrated through improvement in FOTO score by at least 10 points.      PT  LONG TERM GOAL #4   Title  Patient will demonstrate LE strength of at least 4+/5 to improve ease performing functional/recreational tasks.    Baseline  grossly 4+/5 b/l hip strength 4/5    Time  2    Period  Weeks    Status  On-going    Target Date  05/17/18            Plan - 05/03/18 1543    Clinical Impression Statement  Overall patient reports improvement in pain and functional activities, though still lacking compared to PLOF. Overall strength improved, though hip strength and L knee extension still presents with deficits. The patient would benefit from further skilled PT to address these limitations to return patient to PLOF, as well as ongoing soft tissue mobilization and finalized HEP.     PT Frequency  2x / week    PT Duration  2 weeks    PT Treatment/Interventions  Electrical Stimulation;Moist Heat;Fluidtherapy;Stair training;Therapeutic activities;Neuromuscular re-education;Patient/family education;Manual techniques;Passive range of motion;Energy conservation;Therapeutic exercise;Balance training;Functional mobility training;Ultrasound;Gait training;Aquatic Therapy;Dry needling;Taping    PT Next Visit Plan  strengthen b/l hips, pain management modalities, quad strengthening, STM to quads, consider recert for dry needling if continues to experience benefit with trigger point release in medial quadricep    PT Home Exercise Plan  HS, calf stretches, quad set,heel raises, hip extension, s/l hip abduction, 04/18/18: removed quad and piriformis stretches    Consulted and Agree with Plan of Care  Patient       Patient will benefit from skilled therapeutic intervention in order to improve the following deficits and impairments:   Abnormal gait, Decreased activity tolerance, Decreased endurance, Decreased range of motion, Decreased strength, Pain, Postural dysfunction, Obesity, Impaired flexibility, Increased edema, Difficulty walking, Decreased mobility, Decreased balance  Visit Diagnosis: Chronic pain of right knee  Muscle weakness (generalized)     Problem List Patient Active Problem List   Diagnosis Date Noted  . Recurrent major depressive disorder, in full remission ([place]) 01/14/2018  . Abnormal blood chemistry 01/28/2015  . Allergic rhinitis 01/28/2015  . Anxiety 01/28/2015  . Chest pain 01/28/2015  . Family planning 01/28/2015  . Acid reflux 01/28/2015  . LBP (low back pain) 01/28/2015  . Plantar fasciitis 01/28/2015  . Borderline diabetes 01/28/2015  . Psoriasis 01/28/2015  . Avitaminosis D 01/28/2015  . Disease of white blood cells 11/22/2009  . Awareness of heartbeats 03/23/2008  . Adult hypothyroidism 03/14/2008  . History of tobacco use 11/30/2007  . Calculus of kidney 11/23/2007  . Arthritis with psoriasis ([place]) 12/03/2005  . Clinical depression 03/04/2001  . Morbidly obese (Home) 08/10/1998   Lieutenant Diego PT, DPT 5:06 PM,05/03/18 [place] [facility] [address] [place], [address] Phone: [phone]   Fax:  [phone]  Name: MARYA LOWDEN MRN: [id number] Date of Birth: 24-Sep-1975

## 2018-05-04 ENCOUNTER — Ambulatory Visit: Payer: 59

## 2018-05-09 DIAGNOSIS — H5203 Hypermetropia, bilateral: Secondary | ICD-10-CM | POA: Diagnosis not present

## 2018-05-10 ENCOUNTER — Ambulatory Visit: Payer: 59

## 2018-05-12 ENCOUNTER — Ambulatory Visit: Payer: 59 | Attending: Orthopedic Surgery

## 2018-05-12 DIAGNOSIS — M25561 Pain in right knee: Secondary | ICD-10-CM | POA: Insufficient documentation

## 2018-05-12 DIAGNOSIS — M6281 Muscle weakness (generalized): Secondary | ICD-10-CM | POA: Diagnosis not present

## 2018-05-12 DIAGNOSIS — G8929 Other chronic pain: Secondary | ICD-10-CM | POA: Insufficient documentation

## 2018-05-12 NOTE — Therapy (Signed)
[facility] [address] [place], [address] Phone: [phone]   Fax:  [phone]  Physical Therapy Treatment  Patient Details  Name: Kelsey Randolph MRN: [id number] Date of Birth: [birth year] Referring Provider (PT): Leim Fabry   Encounter Date: 05/12/2018  PT End of Session - 05/12/18 1519    Visit Number  7    Number of Visits  8    Date for PT Re-Evaluation  05/17/18    PT Start Time  1520    PT Stop Time  1558    PT Time Calculation (min)  38 min    Activity Tolerance  Patient tolerated treatment well    Behavior During Therapy  [facility] for tasks assessed/performed       Past Medical History:  Diagnosis Date  . Allergy   . Anxiety   . Arthritis    PSORIATIC  . Depression   . GERD (gastroesophageal reflux disease)    RARE-NO MEDS  . History of kidney stones 2004  . Hypothyroidism   . PONV (postoperative nausea and vomiting)    WITH KIDNEY STONE AND TONSILLECTOMY BUT HAD NO N/V WITH HER LAST FOOT SURGERY IN 2013    Past Surgical History:  Procedure Laterality Date  . FOOT SURGERY Left   . kidney stones removed  2004  . TARSAL TUNNEL RELEASE Left 04/16/2017   Procedure: TARSAL TUNNEL RELEASE;  Surgeon: Samara Deist, DPM;  Location: [facility] OR[facility];  Service: Podiatry;  Laterality: Left;  . TENDON REPAIR Left 04/16/2017   Procedure: FLEXOR TENDON REPAIR-SECONDARY ;  Surgeon: Samara Deist, DPM;  Location: [facility] OR[facility];  Service: Podiatry;  Laterality: Left;  . tonsillectomy and adenoidectomy  1993    There were no vitals filed for this visit.  Subjective Assessment - 05/12/18 1521    Subjective  Patient reports that the STM to her R calf was very helpful for her pain. States she has some mild pain, has done some walking.     Currently in Pain?  Yes    Pain Score  2     Pain Location  Knee    Pain Orientation  Right;Medial    Pain Type  Acute pain    Pain Onset  More than a month ago       TREATMENT  Ther-ex: Patient performed with close supervision, verbal cues/demo from PT Improvement in form with repetitions   Soleus stretch 3x30s on R Foam EC FT 3 x 30" Step forward on foam 3x30" b/l seated HS on RTB on RLE 2x10 Standing hip extension/abduction with YTB x15 Mini squats to table with butt touches x30 Attempted 4" heel taps, unable to perform on RLE due to increased pain Single leg squat on total gym level 16 x10   Manual Therapy x 52mins STM to medial quad for tissue adhesion, pain management. Patient reports improvement in symptoms post STM.    PT Education - 05/12/18 1519    Education provided  Yes    Education Details  therex    [employer]) Educated  Patient    Methods  Explanation;Demonstration;Verbal cues;Tactile cues    Comprehension  Verbalized understanding;Returned demonstration       PT Short Term Goals - 04/21/18 1157      PT SHORT TERM GOAL #1   Title  Patient will report adherence ot HEP at least x3 a week in prep for self management of condition.    Time  2    Period  Weeks    Status  Achieved        PT Long Term Goals - 05/03/18 1523      PT LONG TERM GOAL #1   Title  Patient will be independent with HEP focused on improving knee function to continue benefits of therapy after discharge.    Baseline  Patient reports adherence to HEP, final HEP still to be administered    Time  2    Period  Weeks    Status  On-going    Target Date  05/17/18      PT LONG TERM GOAL #2   Title  Patient will report pain with walking/standing for 1 hr to be 3 or less to promote ability to perform functional activities/work duties.    Baseline  Patient reports that she can do 22 minutes of a walk currently. at work 20-25 minutes.    Time  2    Period  Weeks    Status  New    Target Date  05/17/18      PT LONG TERM GOAL #3   Title  Patient will demonstrate improved ability to tolerate and perform functional activities demonstrated through improvement  in FOTO score by at least 10 points.      PT LONG TERM GOAL #4   Title  Patient will demonstrate LE strength of at least 4+/5 to improve ease performing functional/recreational tasks.    Baseline  grossly 4+/5 b/l hip strength 4/5    Time  2    Period  Weeks    Status  On-going    Target Date  05/17/18            Plan - 05/12/18 1557    Clinical Impression Statement  Patient reported pain with 4" heel taps, needed significant cueing for form, unable to perform without pain. SLS on total gym patient had some discomfort but able to perform. Patient continues to progress towards goals.    PT Frequency  2x / week    PT Duration  2 weeks    PT Treatment/Interventions  Electrical Stimulation;Moist Heat;Fluidtherapy;Stair training;Therapeutic activities;Neuromuscular re-education;Patient/family education;Manual techniques;Passive range of motion;Energy conservation;Therapeutic exercise;Balance training;Functional mobility training;Ultrasound;Gait training;Aquatic Therapy;Dry needling;Taping    PT Next Visit Plan  strengthen b/l hips, pain management modalities, quad strengthening, STM to quads, consider recert for dry needling if continues to experience benefit with trigger point release in medial quadricep    PT Home Exercise Plan  HS, calf stretches, quad set,heel raises, hip extension, s/l hip abduction, 04/18/18: removed quad and piriformis stretches    Consulted and Agree with Plan of Care  Patient       Patient will benefit from skilled therapeutic intervention in order to improve the following deficits and impairments:  Decreased activity tolerance, Decreased endurance, Decreased range of motion, Decreased strength, Pain, Postural dysfunction, Obesity, Impaired flexibility, Increased edema, Difficulty walking, Decreased mobility, Decreased balance, Abnormal gait  Visit Diagnosis: Chronic pain of right knee  Muscle weakness (generalized)     Problem List Patient Active Problem  List   Diagnosis Date Noted  . Recurrent major depressive disorder, in full remission ([place]) 01/14/2018  . Abnormal blood chemistry 01/28/2015  . Allergic rhinitis 01/28/2015  . Anxiety 01/28/2015  . Chest pain 01/28/2015  . Family planning 01/28/2015  . Acid reflux 01/28/2015  . LBP (low back pain) 01/28/2015  . Plantar fasciitis 01/28/2015  . Borderline diabetes 01/28/2015  . Psoriasis 01/28/2015  . Avitaminosis D 01/28/2015  . Disease of white  blood cells 11/22/2009  . Awareness of heartbeats 03/23/2008  . Adult hypothyroidism 03/14/2008  . History of tobacco use 11/30/2007  . Calculus of kidney 11/23/2007  . Arthritis with psoriasis ([place]) 12/03/2005  . Clinical depression 03/04/2001  . Morbidly obese ([place]) 08/10/1998    Lieutenant Diego PT, DPT 3:59 PM,05/12/18 [place] [address] [place], [address] Phone: [phone]   Fax:  [phone]  Name: Kelsey Randolph MRN: [id number] Date of Birth: 16-Jul-1976

## 2018-05-16 ENCOUNTER — Ambulatory Visit: Payer: 59

## 2018-05-16 DIAGNOSIS — M6281 Muscle weakness (generalized): Secondary | ICD-10-CM

## 2018-05-16 DIAGNOSIS — G8929 Other chronic pain: Secondary | ICD-10-CM | POA: Diagnosis not present

## 2018-05-16 DIAGNOSIS — M25561 Pain in right knee: Secondary | ICD-10-CM | POA: Diagnosis not present

## 2018-05-16 NOTE — Therapy (Signed)
[facility] [address] [place], [address] Phone: [phone]   Fax:  [phone]  Physical Therapy Treatment and Discharge Note  Patient Details  Name: Kelsey Randolph MRN: [id number] Date of Birth: [birth year] Referring Provider (PT): Leim Fabry   Encounter Date: 05/16/2018  PT End of Session - 05/16/18 1607    Visit Number  8    Date for PT Re-Evaluation  05/17/18    PT Start Time  1600    PT Stop Time  1634    PT Time Calculation (min)  34 min    Activity Tolerance  Patient tolerated treatment well    Behavior During Therapy  [facility] for tasks assessed/performed       Past Medical History:  Diagnosis Date  . Allergy   . Anxiety   . Arthritis    PSORIATIC  . Depression   . GERD (gastroesophageal reflux disease)    RARE-NO MEDS  . History of kidney stones 2004  . Hypothyroidism   . PONV (postoperative nausea and vomiting)    WITH KIDNEY STONE AND TONSILLECTOMY BUT HAD NO N/V WITH HER LAST FOOT SURGERY IN 2013    Past Surgical History:  Procedure Laterality Date  . FOOT SURGERY Left   . kidney stones removed  2004  . TARSAL TUNNEL RELEASE Left 04/16/2017   Procedure: TARSAL TUNNEL RELEASE;  Surgeon: Samara Deist, DPM;  Location: [facility] OR[facility];  Service: Podiatry;  Laterality: Left;  . TENDON REPAIR Left 04/16/2017   Procedure: FLEXOR TENDON REPAIR-SECONDARY ;  Surgeon: Samara Deist, DPM;  Location: [facility];  Service: Podiatry;  Laterality: Left;  . tonsillectomy and adenoidectomy  1993    There were no vitals filed for this visit.  Subjective Assessment - 05/16/18 1603    Subjective  Patient reports that her knee pain is mild today. States she feels that she has improved with therapy, about 75% return to PLOF.     How long can you stand comfortably?  15 mins    How long can you walk comfortably?  30 mins    Patient Stated Goals  Patient wants to improve endurance and pain relief    Currently  in Pain?  Yes    Pain Score  2     Pain Location  Knee    Pain Orientation  Right;Medial    Pain Descriptors / Indicators  Other (Comment)   pinch   Pain Type  Acute pain    Pain Onset  More than a month ago    Aggravating Factors   weight bearing, twisting    Pain Relieving Factors  stretches, HEP, ice, rest, STM    Effect of Pain on Daily Activities  pain during dialy/work activities         [facility] PT Assessment - 05/16/18 0001      Observation/Other Assessments   Focus on Therapeutic Outcomes (FOTO)   58      AROM   Right Knee Extension  0    Right Knee Flexion  120    Left Knee Extension  0    Left Knee Flexion  118      Strength   Strength Assessment Site  Hip;Knee    Right/Left Hip  Right;Left    Right Hip Flexion  5/5    Right Hip Extension  4+/5    Right Hip External Rotation   4+/5    Right Hip Internal Rotation  4+/5  Right Hip ABduction  4+/5    Right Hip ADduction  4-/5    Left Hip Flexion  5/5    Left Hip Extension  4+/5    Left Hip External Rotation  4+/5    Left Hip Internal Rotation  4+/5    Left Hip ABduction  4+/5    Left Hip ADduction  4+/5    Right Knee Flexion  5/5    Right Knee Extension  4+/5    Left Knee Flexion  5/5    Left Knee Extension  5/5      Palpation   Palpation comment  R medial quad, joint line       see objective measures above.   Treatment: s/l hip abduction x20 on RLE SLR with ER x20 on RLE Standing hip extension/abduction RTB x20 b/l K tape applied to R knee for support/neuro-reedu.  Further HEP reviewed verbally and with handout. PT and patient discussed importance of compliance.    PT Education - 05/16/18 1602    Education provided  Yes    Education Details  therex, HEP, POC    Person(s) Educated  Patient    Methods  Explanation;Demonstration;Handout;Tactile cues    Comprehension  Verbalized understanding;Returned demonstration       PT Short Term Goals - 05/16/18 1638      PT SHORT TERM GOAL #1    Title  Patient will report adherence ot HEP at least x3 a week in prep for self management of condition.    Status  Achieved    Target Date  05/16/18        PT Long Term Goals - 05/16/18 1606      PT LONG TERM GOAL #1   Title  Patient will be independent with HEP focused on improving knee function to continue benefits of therapy after discharge.    Baseline  per patient report    Status  Achieved    Target Date  05/16/18      PT LONG TERM GOAL #2   Title  Patient will report pain with walking/standing for 1 hr to be 3 or less to promote ability to perform functional activities/work duties.    Baseline  45 mins with pain 3/10 or less. Patient reports that she can now walk a mile.    Status  Partially Met    Target Date  05/16/18      PT LONG TERM GOAL #3   Title  Patient will demonstrate improved ability to tolerate and perform functional activities demonstrated through improvement in FOTO score by at least 10 points.    Baseline  36/100: 58/100 05/16/2018    Status  Achieved    Target Date  05/16/18      PT LONG TERM GOAL #4   Title  Patient will demonstrate LE strength of at least 4+/5 to improve ease performing functional/recreational tasks.    Status  Achieved    Target Date  05/16/18            Plan - 05/16/18 1638    Clinical Impression Statement  Patient demonstrates R knee ROM WFLs, as well as LE strength WFLs. Patient still with some tenderness to medial joint line. Significant improvement in soft tissue of R knee. Patient also reports working with a Physiological scientist 2x a week and good HEP compliance. At this time patient demonstrates functional activities WFLs with pain 3/10 or less. PT and patient discussed POC and pt agreeable to discharge with updated HEP to  self manage condition at home.     PT Treatment/Interventions  Electrical Stimulation;Moist Heat;Fluidtherapy;Stair training;Therapeutic activities;Neuromuscular re-education;Patient/family education;Manual  techniques;Passive range of motion;Energy conservation;Therapeutic exercise;Balance training;Functional mobility training;Ultrasound;Gait training;Aquatic Therapy;Dry needling;Taping    PT Home Exercise Plan  see patient instruction for updated HEP.    Consulted and Agree with Plan of Care  Patient       Patient will benefit from skilled therapeutic intervention in order to improve the following deficits and impairments:  Pain  Visit Diagnosis: Chronic pain of right knee  Muscle weakness (generalized)     Problem List Patient Active Problem List   Diagnosis Date Noted  . Recurrent major depressive disorder, in full remission ([place]) 01/14/2018  . Abnormal blood chemistry 01/28/2015  . Allergic rhinitis 01/28/2015  . Anxiety 01/28/2015  . Chest pain 01/28/2015  . Family planning 01/28/2015  . Acid reflux 01/28/2015  . LBP (low back pain) 01/28/2015  . Plantar fasciitis 01/28/2015  . Borderline diabetes 01/28/2015  . Psoriasis 01/28/2015  . Avitaminosis D 01/28/2015  . Disease of white blood cells 11/22/2009  . Awareness of heartbeats 03/23/2008  . Adult hypothyroidism 03/14/2008  . History of tobacco use 11/30/2007  . Calculus of kidney 11/23/2007  . Arthritis with psoriasis ([place]) 12/03/2005  . Clinical depression 03/04/2001  . Morbidly obese [facility]) 08/10/1998    Lieutenant Diego PT, DPT 4:42 PM,05/16/18 [phone]  [facility] [address], [place], [address] Phone: [phone]   Fax:  [phone]  Name: ASTER SCREWS MRN: [id number] Date of Birth: [birth year]

## 2018-05-16 NOTE — Patient Instructions (Signed)
Access Code: [email or web address]  URL: [email or web address]  Date: 05/16/2018  Prepared by: Lieutenant Diego   Exercises  Seated Hamstring Stretch with Chair - 3 reps - 2 sets - 30 hold - 1x daily - 7x weekly  Supine Quadriceps Stretch with Strap on Table - 3 reps - 1 sets - 30 hold - 1x daily - 7x weekly  Standing Soleus Stretch - 3 reps - 1 sets - 30 hold - 1x daily - 7x weekly  Standing Gastroc Stretch - 3 reps - 1 sets - 30 hold - 1x daily - 7x weekly  Straight Leg Raise with External Rotation - 10 reps - 2 sets - 1x daily - 4x weekly  Sidelying Hip Abduction - 10 reps - 3 sets - 1x daily - 4x weekly  Single Leg Heel Raise - 10 reps - 3 sets - 1x daily - 4x weekly  Squat with Chair Touch - 10 reps - 2 sets - 1x daily - 4x weekly  Standing Repeated Hip Extension with Resistance - 10 reps - 2 sets - 1x daily - 4x weekly  Standing Repeated Hip Abduction with Resistance - 10 reps - 2 sets - 1x daily - 4x weekly

## 2018-05-18 ENCOUNTER — Ambulatory Visit: Payer: 59

## 2018-05-19 MED FILL — HUMIRA PEN 40 MG/0.8ML PNKT: 40 | 28 days supply | Qty: 2 | Fill #1

## 2018-05-24 ENCOUNTER — Ambulatory Visit: Payer: 59

## 2018-05-26 ENCOUNTER — Ambulatory Visit: Payer: 59

## 2018-05-30 DIAGNOSIS — M9903 Segmental and somatic dysfunction of lumbar region: Secondary | ICD-10-CM | POA: Diagnosis not present

## 2018-05-30 DIAGNOSIS — M25561 Pain in right knee: Secondary | ICD-10-CM | POA: Diagnosis not present

## 2018-05-30 DIAGNOSIS — S83231A Complex tear of medial meniscus, current injury, right knee, initial encounter: Secondary | ICD-10-CM | POA: Diagnosis not present

## 2018-05-30 DIAGNOSIS — M1711 Unilateral primary osteoarthritis, right knee: Secondary | ICD-10-CM | POA: Diagnosis not present

## 2018-05-30 DIAGNOSIS — M5431 Sciatica, right side: Secondary | ICD-10-CM | POA: Diagnosis not present

## 2018-06-03 DIAGNOSIS — M5431 Sciatica, right side: Secondary | ICD-10-CM | POA: Diagnosis not present

## 2018-06-03 DIAGNOSIS — M25561 Pain in right knee: Secondary | ICD-10-CM | POA: Diagnosis not present

## 2018-06-03 DIAGNOSIS — M9903 Segmental and somatic dysfunction of lumbar region: Secondary | ICD-10-CM | POA: Diagnosis not present

## 2018-06-06 DIAGNOSIS — M9903 Segmental and somatic dysfunction of lumbar region: Secondary | ICD-10-CM | POA: Diagnosis not present

## 2018-06-06 DIAGNOSIS — M5431 Sciatica, right side: Secondary | ICD-10-CM | POA: Diagnosis not present

## 2018-06-06 DIAGNOSIS — M25561 Pain in right knee: Secondary | ICD-10-CM | POA: Diagnosis not present

## 2018-06-10 DIAGNOSIS — M9903 Segmental and somatic dysfunction of lumbar region: Secondary | ICD-10-CM | POA: Diagnosis not present

## 2018-06-10 DIAGNOSIS — M5431 Sciatica, right side: Secondary | ICD-10-CM | POA: Diagnosis not present

## 2018-06-10 DIAGNOSIS — M25561 Pain in right knee: Secondary | ICD-10-CM | POA: Diagnosis not present

## 2018-06-15 MED FILL — HUMIRA PEN 40 MG/0.8ML PNKT: 40 | 28 days supply | Qty: 2 | Fill #2

## 2018-06-17 DIAGNOSIS — M9903 Segmental and somatic dysfunction of lumbar region: Secondary | ICD-10-CM | POA: Diagnosis not present

## 2018-06-17 DIAGNOSIS — M5431 Sciatica, right side: Secondary | ICD-10-CM | POA: Diagnosis not present

## 2018-06-17 DIAGNOSIS — M25561 Pain in right knee: Secondary | ICD-10-CM | POA: Diagnosis not present

## 2018-06-23 DIAGNOSIS — M25561 Pain in right knee: Secondary | ICD-10-CM | POA: Diagnosis not present

## 2018-06-23 DIAGNOSIS — L4050 Arthropathic psoriasis, unspecified: Secondary | ICD-10-CM | POA: Diagnosis not present

## 2018-06-23 DIAGNOSIS — G8929 Other chronic pain: Secondary | ICD-10-CM | POA: Diagnosis not present

## 2018-06-24 IMAGING — CR DG ANKLE COMPLETE 3+V*L*
1 series · 3 of 3 positions shown · non-contrast
Comparison: None.

CLINICAL DATA: Left ankle pain and swelling, evaluate for possible
stress fracture

EXAM:
LEFT ANKLE COMPLETE - 3+ VIEW

[Series 1: dg ankle complete left · 0.14mm/px · 3 of 3 slices shown]
[im 1/3]
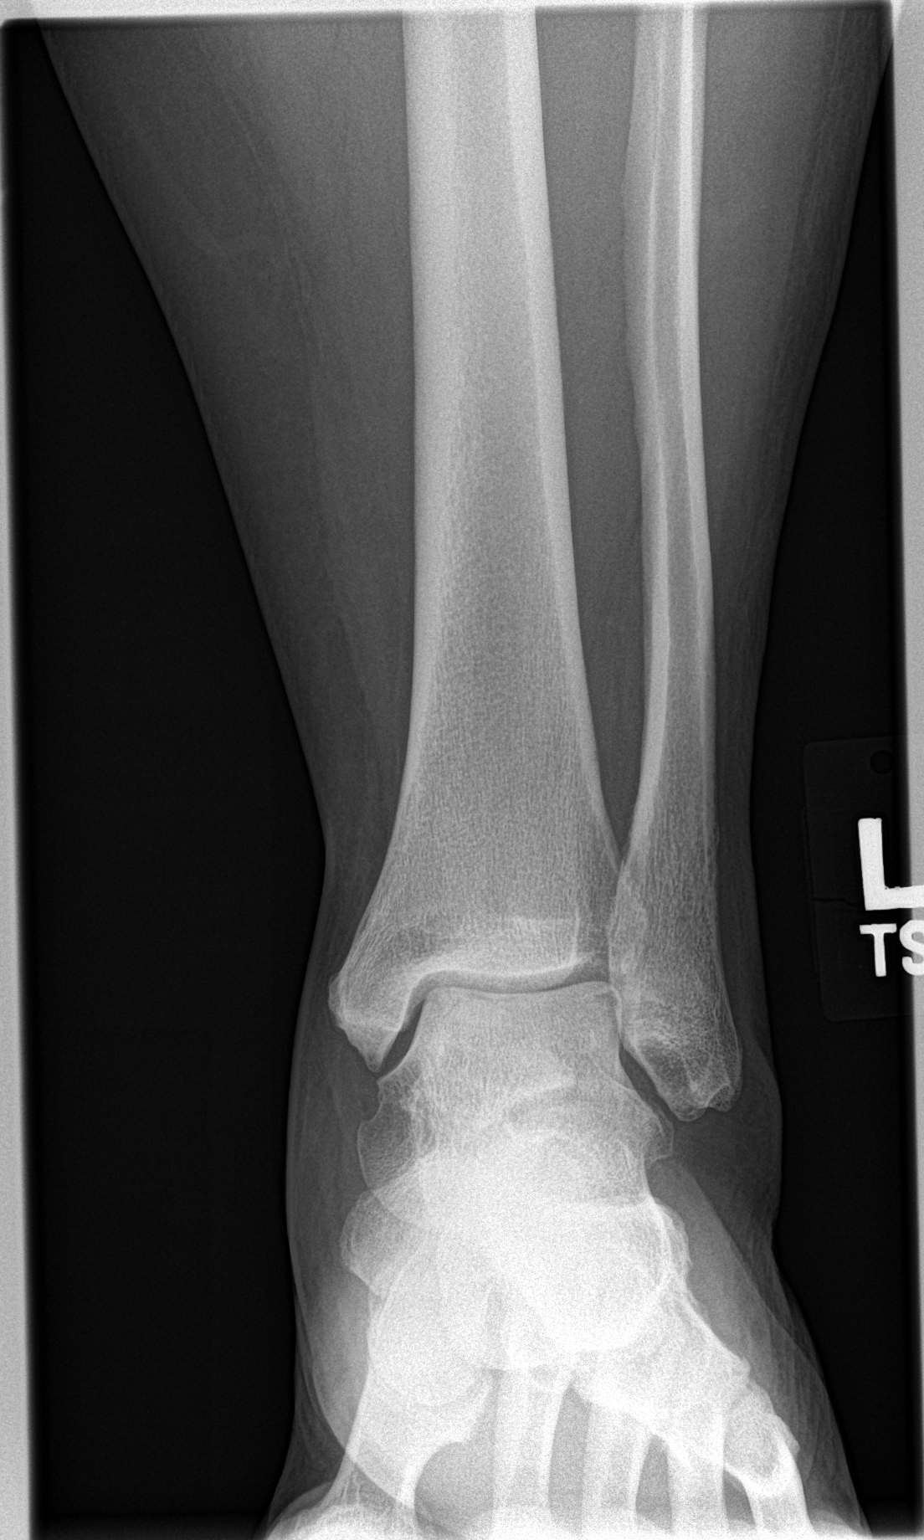
[im 2/3]
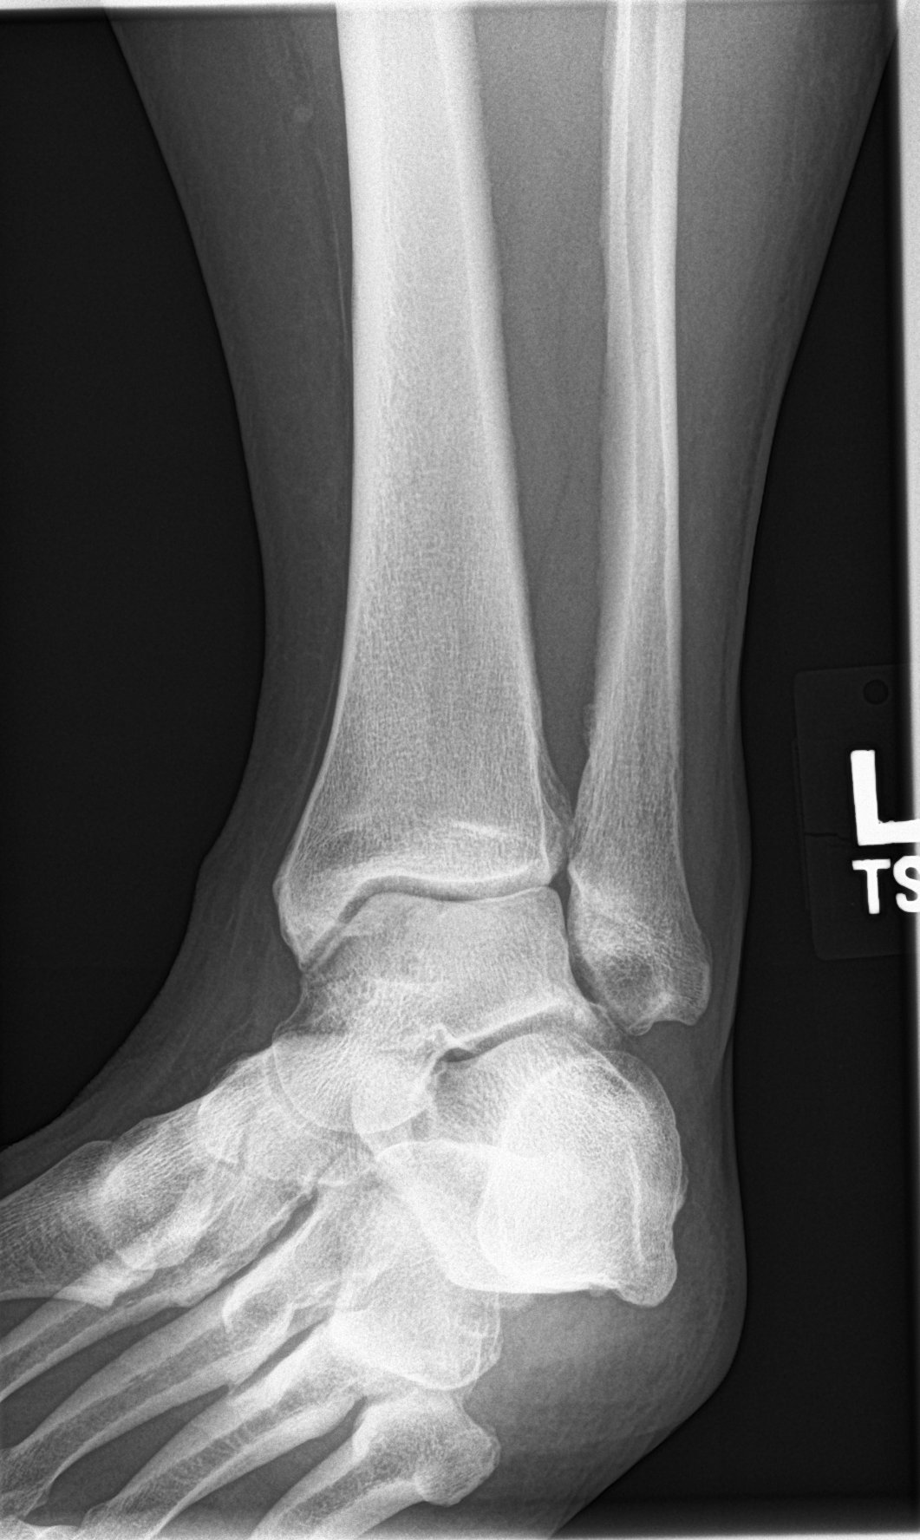
[im 3/3]
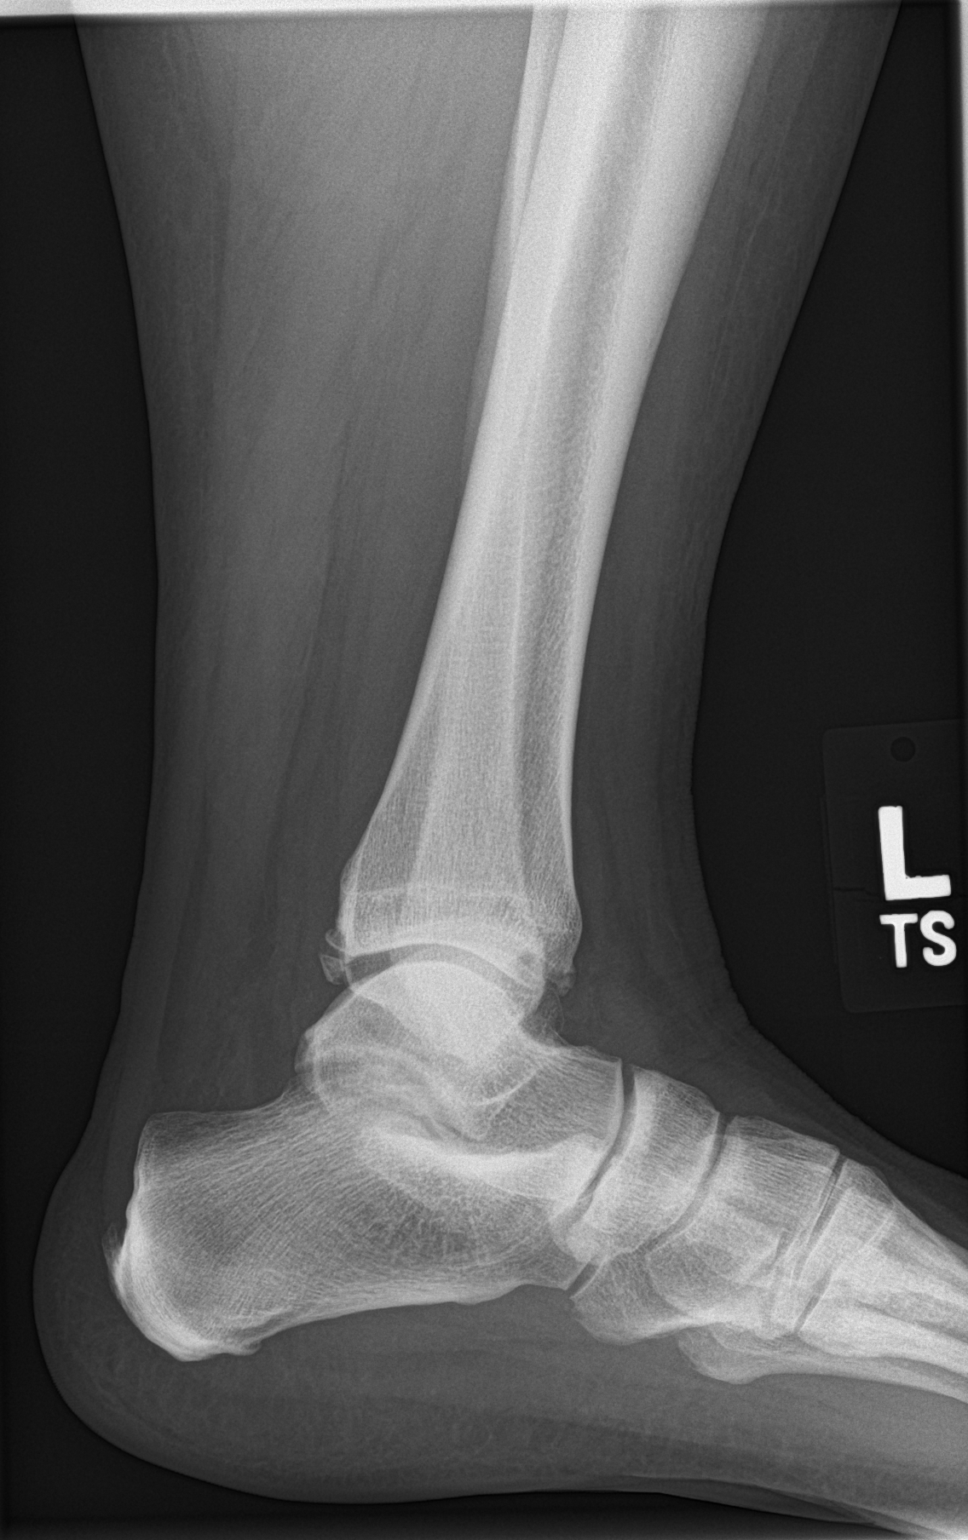

[3 of 3 positions shown; findings below may reference images not displayed]

FINDINGS: The left ankle joint appears normal. No fracture is seen. No
periosteal reaction is noted. Alignment is normal.
IMPRESSION: Negative.

## 2018-07-01 DIAGNOSIS — M5431 Sciatica, right side: Secondary | ICD-10-CM | POA: Diagnosis not present

## 2018-07-01 DIAGNOSIS — M25561 Pain in right knee: Secondary | ICD-10-CM | POA: Diagnosis not present

## 2018-07-01 DIAGNOSIS — M9903 Segmental and somatic dysfunction of lumbar region: Secondary | ICD-10-CM | POA: Diagnosis not present

## 2018-07-12 ENCOUNTER — Encounter: Payer: Self-pay | Admitting: Pharmacist

## 2018-07-12 ENCOUNTER — Ambulatory Visit (INDEPENDENT_AMBULATORY_CARE_PROVIDER_SITE_OTHER): Payer: 59 | Admitting: Pharmacist

## 2018-07-12 DIAGNOSIS — Z79899 Other long term (current) drug therapy: Secondary | ICD-10-CM

## 2018-07-12 NOTE — Progress Notes (Signed)
   S: Patient presents today to the [facility] for review of their specialty medication.   Patient is currently taking Cosentyx for psoriatic arthritis. Patient is managed by Dr. Jefm Bryant for this.   Adherence: has not started yet, denies any issues missing doses of Humira  Efficacy: has not started yet. Humira lost efficacy. Psoriasis has been flaring and breaking through despite treatment with Humira.  Current adverse effects: S/sx of infection: denies GI upset: has not started yet Headache: has not started yet S/sx of hypersensitivity: has not started yet    O:     Lab Results  Component Value Date   WBC 8.1 01/19/2018   HGB 14.0 01/19/2018   HCT 41.9 01/19/2018   MCV 86 01/19/2018   PLT 303 01/19/2018      Chemistry      Component Value Date/Time   NA 140 01/19/2018 0922   K 4.1 01/19/2018 0922   CL 102 01/19/2018 0922   CO2 23 01/19/2018 0922   BUN 13 01/19/2018 0922   CREATININE 0.47 (L) 01/19/2018 0922      Component Value Date/Time   CALCIUM 9.1 01/19/2018 0922   ALKPHOS 87 01/19/2018 0922   AST 14 01/19/2018 0922   ALT 11 01/19/2018 0922   BILITOT 0.4 01/19/2018 0922       A/P: 1. Medication review: Patient is currently on Cosentyx for psoriatic arthritis but has not started yet, just had a dose of Humira last week so is waiting for that to wash out. Reviewed the medication with the patient, including the following: Cosentyx is a monoclonal antibody used in the treatment of ankylosing spondylitis, psoriasis, and psoriatic arthritis. The injection is subq and the medication should be allowed to reach room temp prior to injecting. Injection sites should be rotated. Possible adverse effects include headaches, GI upset, increased risk of infection and hypersensitivity reactions. No recommendations for any changes at this time.    Christella Hartigan, PharmD, BCPS, BCACP, CPP Clinical Pharmacist Practitioner  [phone]

## 2018-07-14 ENCOUNTER — Other Ambulatory Visit: Payer: Self-pay | Admitting: Pharmacist

## 2018-07-14 MED ORDER — SECUKINUMAB (300 MG DOSE) 150 MG/ML ~~LOC~~ SOSY: 2.0000 "pen " | PREFILLED_SYRINGE | SUBCUTANEOUS | 5 refills | Status: DC

## 2018-07-14 MED ORDER — SECUKINUMAB (300 MG DOSE) 150 MG/ML ~~LOC~~ SOAJ: 2.0000 "pen " | SUBCUTANEOUS | 5 refills | Status: DC

## 2018-07-14 MED ORDER — SECUKINUMAB (300 MG DOSE) 150 MG/ML ~~LOC~~ SOAJ: 2.0000 "pen " | SUBCUTANEOUS | 1 refills | Status: DC

## 2018-07-14 MED ORDER — SECUKINUMAB (300 MG DOSE) 150 MG/ML ~~LOC~~ SOSY: 2.0000 "pen " | PREFILLED_SYRINGE | SUBCUTANEOUS | 1 refills | Status: DC

## 2018-07-14 MED FILL — COSENTYX 300 MG DOSE-2 PENS: 150 | 28 days supply | Qty: 8 | Fill #0

## 2018-07-21 DIAGNOSIS — Z1283 Encounter for screening for malignant neoplasm of skin: Secondary | ICD-10-CM | POA: Diagnosis not present

## 2018-07-21 DIAGNOSIS — L578 Other skin changes due to chronic exposure to nonionizing radiation: Secondary | ICD-10-CM | POA: Diagnosis not present

## 2018-07-21 DIAGNOSIS — L812 Freckles: Secondary | ICD-10-CM | POA: Diagnosis not present

## 2018-07-21 DIAGNOSIS — D1800 Hemangioma unspecified site: Secondary | ICD-10-CM | POA: Diagnosis not present

## 2018-07-21 DIAGNOSIS — L4052 Psoriatic arthritis mutilans: Secondary | ICD-10-CM | POA: Diagnosis not present

## 2018-07-21 DIAGNOSIS — L821 Other seborrheic keratosis: Secondary | ICD-10-CM | POA: Diagnosis not present

## 2018-07-21 DIAGNOSIS — L400 Psoriasis vulgaris: Secondary | ICD-10-CM | POA: Diagnosis not present

## 2018-07-21 DIAGNOSIS — D485 Neoplasm of uncertain behavior of skin: Secondary | ICD-10-CM | POA: Diagnosis not present

## 2018-07-21 DIAGNOSIS — D225 Melanocytic nevi of trunk: Secondary | ICD-10-CM | POA: Diagnosis not present

## 2018-08-12 MED FILL — COSENTYX 300 MG DOSE-2 PENS: 150 | 28 days supply | Qty: 2 | Fill #0

## 2018-08-29 ENCOUNTER — Telehealth: Payer: 59 | Admitting: Family Medicine

## 2018-08-29 ENCOUNTER — Encounter: Payer: Self-pay | Admitting: Family Medicine

## 2018-08-29 DIAGNOSIS — M79604 Pain in right leg: Secondary | ICD-10-CM

## 2018-08-29 DIAGNOSIS — M545 Low back pain, unspecified: Secondary | ICD-10-CM

## 2018-08-29 DIAGNOSIS — M79605 Pain in left leg: Principal | ICD-10-CM

## 2018-08-29 NOTE — Progress Notes (Signed)
Based on what you shared with me it looks like you have a condition that should be evaluated in a face to face office visit.  NOTE: If you entered your credit card information for this eVisit, you will not be charged. You may see a "hold" on your card for the $30 but that hold will drop off and you will not have a charge processed.  If you are having a true medical emergency please call 911.  If you need an urgent face to face visit, [facility] has four urgent care centers for your convenience.  If you need care fast and have a high deductible or no insurance consider:   [email or web address] to reserve your spot online an avoid wait times  [facility] [address], [address] [place], [place] [address] 8 am to 8 pm Monday-Friday 10 am to 4 pm Saturday-Sunday *Across the street from [employer]  [facility]  [address] [place], [address] 8 am to 5 pm Monday-Friday * In the [facility]   The following sites will take your  insurance:  . [facility]  [phone] Get Driving Directions Find a Provider at this Location  [address] , [place] [address] . 10 am to 8 pm Monday-Friday . 12 pm to 8 pm Saturday-Sunday   . [facility]  [phone] Get Driving Directions Find a Provider at this Location  [address], [address] [place], [place] [address] . 8 am to 8 pm Monday-Friday . 9 am to 6 pm Saturday . 11 am to 6 pm Sunday   .  [facility] at [facility] [place]  [phone] Get Driving Directions  [address] [place], [place] [address] . 8 am to 8 pm Monday-Friday . 8 am to 4 pm Saturday-Sunday   Your e-visit answers were reviewed by a board certified advanced clinical practitioner to complete your personal care plan.  Thank you for using e-Visits. 

## 2018-09-12 MED FILL — COSENTYX 300 MG DOSE-2 PENS: 150 | 28 days supply | Qty: 2 | Fill #1

## 2018-09-22 DIAGNOSIS — M25561 Pain in right knee: Secondary | ICD-10-CM | POA: Diagnosis not present

## 2018-09-22 DIAGNOSIS — G8929 Other chronic pain: Secondary | ICD-10-CM | POA: Diagnosis not present

## 2018-09-22 DIAGNOSIS — L4050 Arthropathic psoriasis, unspecified: Secondary | ICD-10-CM | POA: Diagnosis not present

## 2018-09-29 DIAGNOSIS — H1032 Unspecified acute conjunctivitis, left eye: Secondary | ICD-10-CM | POA: Diagnosis not present

## 2018-10-11 MED FILL — COSENTYX 300 MG DOSE-2 PENS: 150 | 28 days supply | Qty: 2 | Fill #2

## 2018-10-12 ENCOUNTER — Other Ambulatory Visit: Payer: Self-pay | Admitting: Physician Assistant

## 2018-10-12 DIAGNOSIS — Z1231 Encounter for screening mammogram for malignant neoplasm of breast: Secondary | ICD-10-CM

## 2018-10-21 ENCOUNTER — Ambulatory Visit
Admission: RE | Admit: 2018-10-21 | Discharge: 2018-10-21 | Disposition: A | Discharge status: Home / Self Care | Payer: 59 | Source: Ambulatory Visit | Attending: Physician Assistant | Admitting: Physician Assistant

## 2018-10-21 ENCOUNTER — Other Ambulatory Visit: Payer: Self-pay

## 2018-10-21 DIAGNOSIS — Z1231 Encounter for screening mammogram for malignant neoplasm of breast: Secondary | ICD-10-CM | POA: Diagnosis not present

## 2018-10-31 MED FILL — COSENTYX 300 MG DOSE-2 PENS: 150 | 28 days supply | Qty: 2 | Fill #3

## 2018-11-08 ENCOUNTER — Other Ambulatory Visit: Payer: Self-pay | Admitting: Physician Assistant

## 2018-11-08 DIAGNOSIS — F3342 Major depressive disorder, recurrent, in full remission: Secondary | ICD-10-CM

## 2018-11-08 MED FILL — sulfaSALAzine 500 MG TABS: 500 | 30 days supply | Qty: 60 | Fill #0

## 2018-11-08 MED FILL — ESCITALOPRAM 20 MG TABLET: 20 | 90 days supply | Qty: 90 | Fill #0

## 2018-11-08 MED FILL — MELOXICAM 15 MG TABLET: 15 | 30 days supply | Qty: 30 | Fill #0 | Status: TO

## 2018-11-08 NOTE — Telephone Encounter (Signed)
Please review

## 2018-11-30 MED FILL — COSENTYX 300 MG DOSE-2 PENS: 150 | 28 days supply | Qty: 2 | Fill #4

## 2018-12-28 DIAGNOSIS — M62838 Other muscle spasm: Secondary | ICD-10-CM | POA: Diagnosis not present

## 2018-12-28 DIAGNOSIS — M7061 Trochanteric bursitis, right hip: Secondary | ICD-10-CM | POA: Diagnosis not present

## 2018-12-28 DIAGNOSIS — M25551 Pain in right hip: Secondary | ICD-10-CM | POA: Diagnosis not present

## 2018-12-30 MED FILL — COSENTYX 300 MG DOSE-2 PENS: 150 | 28 days supply | Qty: 2 | Fill #5

## 2019-01-16 NOTE — Progress Notes (Signed)
Patient: Kelsey Randolph, Female    DOB: 1976-03-10, 43 y.o.   MRN: [id number] Visit Date: 01/18/2019  Today's Provider: Mar Daring, PA-C   Chief Complaint  Patient presents with  . Annual Exam   Subjective:     Annual physical exam Kelsey Randolph is a 43 y.o. female who presents today for health maintenance and complete physical. She feels well. She reports exercising yes/walking. She reports she is sleeping well. -----------------------------------------------------------------   Review of Systems  Constitutional: Negative.   HENT: Positive for hearing loss.   Eyes: Positive for itching.  Respiratory: Negative.   Cardiovascular: Negative.   Gastrointestinal: Negative.   Endocrine: Negative.   Genitourinary: Negative.   Musculoskeletal: Positive for arthralgias and joint swelling.  Skin: Negative.   Allergic/Immunologic: Positive for environmental allergies.  Neurological: Negative.   Hematological: Negative.   Psychiatric/Behavioral: Negative.     Social History      She  reports that she quit smoking about 3 years ago. Her smoking use included cigarettes. She has a 10.00 pack-year smoking history. She has never used smokeless tobacco. She reports current alcohol use. She reports that she does not use drugs.       Social History   Socioeconomic History  . Marital status: Married    Spouse name: Not on file  . Number of children: Not on file  . Years of education: Not on file  . Highest education level: Not on file  Occupational History    Employer: [employer]  . Financial resource strain: Not on file  . Food insecurity:    Worry: Not on file    Inability: Not on file  . Transportation needs:    Medical: Not on file    Non-medical: Not on file  Tobacco Use  . Smoking status: Former Smoker    Packs/day: 0.50    Years: 20.00    Pack years: 10.00    Types: Cigarettes    Last attempt to quit: 08/10/2015    Years since quitting:  3.4  . Smokeless tobacco: Never Used  Substance and Sexual Activity  . Alcohol use: Yes    Comment: RARE  . Drug use: No  . Sexual activity: Not on file  Lifestyle  . Physical activity:    Days per week: Not on file    Minutes per session: Not on file  . Stress: Not on file  Relationships  . Social connections:    Talks on phone: Not on file    Gets together: Not on file    Attends religious service: Not on file    Active member of club or organization: Not on file    Attends meetings of clubs or organizations: Not on file    Relationship status: Not on file  Other Topics Concern  . Not on file  Social History Narrative  . Not on file    Past Medical History:  Diagnosis Date  . Allergy   . Anxiety   . Arthritis    PSORIATIC  . Depression   . GERD (gastroesophageal reflux disease)    RARE-NO MEDS  . History of kidney stones 2004  . Hx of dysplastic nevus 07/16/2016   multiple sites   . Hypothyroidism   . PONV (postoperative nausea and vomiting)    WITH KIDNEY STONE AND TONSILLECTOMY BUT HAD NO N/V WITH HER LAST FOOT SURGERY IN 2013     Patient Active Problem List  Diagnosis Date Noted  . Recurrent major depressive disorder, in full remission ([place]) 01/14/2018  . Abnormal blood chemistry 01/28/2015  . Allergic rhinitis 01/28/2015  . Anxiety 01/28/2015  . Chest pain 01/28/2015  . Family planning 01/28/2015  . Acid reflux 01/28/2015  . LBP (low back pain) 01/28/2015  . Plantar fasciitis 01/28/2015  . Borderline diabetes 01/28/2015  . Psoriasis 01/28/2015  . Avitaminosis D 01/28/2015  . Disease of white blood cells 11/22/2009  . Awareness of heartbeats 03/23/2008  . Adult hypothyroidism 03/14/2008  . History of tobacco use 11/30/2007  . Calculus of kidney 11/23/2007  . Arthritis with psoriasis ([place]) 12/03/2005  . Clinical depression 03/04/2001  . Morbidly obese ([place]) 08/10/1998    Past Surgical History:  Procedure Laterality Date  . FOOT SURGERY Left    . kidney stones removed  2004  . TARSAL TUNNEL RELEASE Left 04/16/2017   Procedure: TARSAL TUNNEL RELEASE;  Surgeon: Samara Deist, DPM;  Location: [facility];  Service: Podiatry;  Laterality: Left;  . TENDON REPAIR Left 04/16/2017   Procedure: FLEXOR TENDON REPAIR-SECONDARY ;  Surgeon: Samara Deist, DPM;  Location: [facility] OR[facility];  Service: Podiatry;  Laterality: Left;  . tonsillectomy and adenoidectomy  1993    Family History        Family Status  Relation Name Status  . Mother  Alive  . Brother  Alive  . PGF  Deceased at age 83  . Father  Deceased at age 15       MI  . MGM  (Not Specified)        Her family history includes Dementia in her maternal grandmother; Depression in her mother; Diabetes in her maternal grandmother; Healthy in her brother; Heart disease in her father and paternal grandfather; Hyperlipidemia in her mother; Hypertension in her mother.      No Known Allergies   Current Outpatient Medications:  .  acetaminophen (TYLENOL) 500 MG tablet, Take 500 mg by mouth every 6 (six) hours as needed., Disp: , Rfl:  .  ALPRAZolam (XANAX) 0.5 MG tablet, Take 1 tablet (0.5 mg total) by mouth at bedtime as needed for anxiety., Disp: 90 tablet, Rfl: 1 .  b complex vitamins tablet, Take 1 tablet by mouth daily., Disp: , Rfl:  .  cetirizine (ZYRTEC ALLERGY) 10 MG tablet, Take 10 mg by mouth daily. , Disp: , Rfl:  .  Cholecalciferol 1000 UNITS capsule, Take 1,000 Units by mouth daily. , Disp: , Rfl:  .  escitalopram (LEXAPRO) 20 MG tablet, TAKE 1 TABLET BY MOUTH DAILY., Disp: 90 tablet, Rfl: 1 .  levothyroxine (SYNTHROID, LEVOTHROID) 50 MCG tablet, Take 1 tablet (50 mcg total) by mouth daily before breakfast., Disp: 90 tablet, Rfl: 3 .  meloxicam (MOBIC) 15 MG tablet, , Disp: , Rfl: 3 .  Secukinumab, 300 MG Dose, (COSENTYX SENSOREADY, 300 MG,) 150 MG/ML SOAJ, Inject 2 pens into the skin every 28 (twenty-eight) days. Inject 2 pens under the skin at day 28 then every 4 weeks as directed,  Disp: 2 pen, Rfl: 5 .  SORILUX 0.005 % FOAM, , Disp: , Rfl:  .  sulfaSALAzine (AZULFIDINE) 500 MG tablet, Take 500 mg by mouth 2 (two) times daily., Disp: , Rfl:  .  hydrocortisone valerate cream (WESTCORT) 0.2 %, , Disp: , Rfl: 3 .  Secukinumab, 300 MG Dose, (COSENTYX SENSOREADY, 300 MG,) 150 MG/ML SOAJ, Inject 2 pens into the skin once a week. Inject 2 pens under the skin every week for 5 weeks as directed (  Patient not taking: Reported on 01/18/2019), Disp: 8 pen, Rfl: 1   Patient Care Team: Mar Daring, PA-C as PCP - General (Family Medicine)    Objective:    Vitals: BP 135/90 (BP Location: Left Wrist, Patient Position: Sitting, Cuff Size: Large)   Pulse 99   Temp 98.4 F (36.9 C) (Oral)   Resp 16   Ht 5\' 3"  (1.6 m)   Wt (!) 322 lb (146.1 kg)   LMP 01/12/2019   SpO2 97%   BMI 57.04 kg/m    Vitals:   01/18/19 0909  BP: 135/90  Pulse: 99  Resp: 16  Temp: 98.4 F (36.9 C)  TempSrc: Oral  SpO2: 97%  Weight: (!) 322 lb (146.1 kg)  Height: 5\' 3"  (1.6 m)     Physical Exam Vitals signs reviewed.  Constitutional:      General: She is not in acute distress.    Appearance: Normal appearance. She is well-developed. She is obese. She is not ill-appearing or diaphoretic.  HENT:     Head: Normocephalic and atraumatic.     Right Ear: Hearing, tympanic membrane, ear canal and external ear normal.     Left Ear: Hearing, tympanic membrane, ear canal and external ear normal.     Nose: Nose normal.     Mouth/Throat:     Mouth: Mucous membranes are moist.     Pharynx: Uvula midline. No oropharyngeal exudate.  Eyes:     General: No scleral icterus.       Right eye: No discharge.        Left eye: No discharge.     Extraocular Movements: Extraocular movements intact.     Conjunctiva/sclera: Conjunctivae normal.     Pupils: Pupils are equal, round, and reactive to light.  Neck:     Musculoskeletal: Normal range of motion and neck supple.     Thyroid: No thyromegaly.      Vascular: No carotid bruit or JVD.     Trachea: No tracheal deviation.  Cardiovascular:     Rate and Rhythm: Normal rate and regular rhythm.     Pulses: Normal pulses.     Heart sounds: Normal heart sounds. No murmur. No friction rub. No gallop.   Pulmonary:     Effort: Pulmonary effort is normal. No respiratory distress.     Breath sounds: Normal breath sounds. No wheezing or rales.  Chest:     Chest wall: No tenderness.     Breasts: Breasts are symmetrical.        Right: Normal. No inverted nipple, mass, nipple discharge, skin change or tenderness.        Left: Normal. No inverted nipple, mass, nipple discharge, skin change or tenderness.  Abdominal:     General: Abdomen is protuberant. Bowel sounds are normal. There is no distension.     Palpations: Abdomen is soft. There is no mass.     Tenderness: There is no abdominal tenderness. There is no guarding or rebound.     Hernia: There is no hernia in the right inguinal area or left inguinal area.  Genitourinary:    Exam position: Supine.     Labia:        Right: No rash, tenderness, lesion or injury.        Left: No rash, tenderness, lesion or injury.      Vagina: Normal. No signs of injury. No vaginal discharge, erythema, tenderness or bleeding.     Adnexa:  Right: No mass, tenderness or fullness.         Left: No mass, tenderness or fullness.       Rectum: Normal.     Comments: Cervix not well visualized due to body habitus Musculoskeletal: Normal range of motion.        General: No tenderness.  Lymphadenopathy:     Cervical: No cervical adenopathy.     Upper Body:     Right upper body: No supraclavicular, axillary or pectoral adenopathy.     Left upper body: No supraclavicular, axillary or pectoral adenopathy.  Skin:    General: Skin is warm and dry.     Capillary Refill: Capillary refill takes less than 2 seconds.     Findings: No rash.  Neurological:     General: No focal deficit present.     Mental Status: She  is alert and oriented to person, place, and time. Mental status is at baseline.     Cranial Nerves: No cranial nerve deficit.     Motor: No weakness.     Coordination: Coordination normal.     Gait: Gait normal.     Deep Tendon Reflexes: Reflexes are normal and symmetric.  Psychiatric:        Mood and Affect: Mood normal.        Behavior: Behavior normal.        Thought Content: Thought content normal.        Judgment: Judgment normal.      Depression Screen PHQ 2/9 Scores 01/18/2019 03/31/2018 01/14/2018 06/21/2017  PHQ - 2 Score 1 0 0 0  PHQ- 9 Score 1 - 0 0       Assessment & Plan:     Routine Health Maintenance and Physical Exam  Exercise Activities and Dietary recommendations Goals   None     Immunization History  Administered Date(s) Administered  . Influenza-Unspecified 05/20/2017  . Td 01/14/2018  . Tdap 10/21/2006    Health Maintenance  Topic Date Due  . PAP SMEAR-Modifier  01/22/2019  . INFLUENZA VACCINE  03/11/2019  . TETANUS/TDAP  01/15/2028  . HIV Screening  Completed     Discussed health benefits of physical activity, and encouraged her to engage in regular exercise appropriate for her age and condition.    1. Annual physical exam Normal physical exam today. Will check labs as below and f/u pending lab results. If labs are stable and WNL she will not need to have these rechecked for one year at her next annual physical exam. She is to call the office in the meantime if she has any acute issue, questions or concerns.  2. Cervical cancer screening Pap collected today. Will send as below and f/u pending results. - Cytology - PAP  3. Adult hypothyroidism Stable. Diagnosis pulled for medication refill. Continue current medical treatment plan. Will check labs as below and f/u pending results. - CBC w/Diff/Platelet - Comprehensive Metabolic Panel (CMET) - TSH - levothyroxine (SYNTHROID) 50 MCG tablet; Take 1 tablet (50 mcg total) by mouth daily  before breakfast.  Dispense: 90 tablet; Refill: 3  4. Borderline diabetes Diet controlled. Will check labs as below and f/u pending results. - CBC w/Diff/Platelet - Comprehensive Metabolic Panel (CMET) - Lipid Profile - HgB A1c  5. Class 3 severe obesity due to excess calories with serious comorbidity and body mass index (BMI) of 50.0 to 59.9 in adult [facility]) Patient has been doing boot camp up until Covid and does plan to start back next week  as her gym is opening for outdoor only work-outs.  - CBC w/Diff/Platelet - Comprehensive Metabolic Panel (CMET) - Lipid Profile - HgB A1c  6. Arthritis with psoriasis ([place]) Stable on Cosentyx. Followed by Rheumatology.  - CBC w/Diff/Platelet - Comprehensive Metabolic Panel (CMET)  7. Recurrent major depressive disorder, in full remission ([place]) Stable. Diagnosis pulled for medication refill. Continue current medical treatment plan. Will check labs as below and f/u pending results. - TSH - escitalopram (LEXAPRO) 20 MG tablet; Take 1 tablet (20 mg total) by mouth daily.  Dispense: 90 tablet; Refill: 3  --------------------------------------------------------------------    Mar Daring, PA-C  [facility]

## 2019-01-18 ENCOUNTER — Other Ambulatory Visit (HOSPITAL_COMMUNITY)
Admission: RE | Admit: 2019-01-18 | Discharge: 2019-01-18 | Disposition: A | Discharge status: Home / Self Care | Payer: 59 | Source: Ambulatory Visit | Attending: Physician Assistant | Admitting: Physician Assistant

## 2019-01-18 ENCOUNTER — Encounter: Payer: Self-pay | Admitting: Physician Assistant

## 2019-01-18 ENCOUNTER — Other Ambulatory Visit: Payer: Self-pay

## 2019-01-18 ENCOUNTER — Ambulatory Visit (INDEPENDENT_AMBULATORY_CARE_PROVIDER_SITE_OTHER): Payer: 59 | Admitting: Physician Assistant

## 2019-01-18 VITALS — BP 135/90 | HR 99 | Temp 98.4°F | Resp 16 | Ht 63.0 in | Wt 322.0 lb

## 2019-01-18 DIAGNOSIS — E039 Hypothyroidism, unspecified: Secondary | ICD-10-CM | POA: Diagnosis not present

## 2019-01-18 DIAGNOSIS — R7303 Prediabetes: Secondary | ICD-10-CM

## 2019-01-18 DIAGNOSIS — Z0000 Encounter for general adult medical examination without abnormal findings: Secondary | ICD-10-CM

## 2019-01-18 DIAGNOSIS — Z6841 Body Mass Index (BMI) 40.0 and over, adult: Secondary | ICD-10-CM

## 2019-01-18 DIAGNOSIS — L4050 Arthropathic psoriasis, unspecified: Secondary | ICD-10-CM | POA: Diagnosis not present

## 2019-01-18 DIAGNOSIS — Z124 Encounter for screening for malignant neoplasm of cervix: Secondary | ICD-10-CM

## 2019-01-18 DIAGNOSIS — F3342 Major depressive disorder, recurrent, in full remission: Secondary | ICD-10-CM

## 2019-01-18 MED ORDER — ESCITALOPRAM OXALATE 20 MG PO TABS: 20.0000 mg | ORAL_TABLET | Freq: Every day | ORAL | 3 refills | Status: DC

## 2019-01-18 MED ORDER — LEVOTHYROXINE SODIUM 50 MCG PO TABS: 50.0000 ug | ORAL_TABLET | Freq: Every day | ORAL | 3 refills | Status: DC

## 2019-01-18 NOTE — Patient Instructions (Signed)

## 2019-01-19 LAB — COMPREHENSIVE METABOLIC PANEL
ALT: 20 IU/L (ref 0–32)
AST: 18 IU/L (ref 0–40)
Albumin/Globulin Ratio: 1.9 (ref 1.2–2.2)
Albumin: 4 g/dL (ref 3.8–4.8)
Alkaline Phosphatase: 102 IU/L (ref 39–117)
BUN/Creatinine Ratio: 14 (ref 9–23)
BUN: 7 mg/dL (ref 6–24)
Bilirubin Total: 0.3 mg/dL (ref 0.0–1.2)
CO2: 25 mmol/L (ref 20–29)
Calcium: 8.9 mg/dL (ref 8.7–10.2)
Chloride: 100 mmol/L (ref 96–106)
Creatinine, Ser: 0.51 mg/dL — ABNORMAL LOW (ref 0.57–1.00)
GFR calc Af Amer: 138 mL/min/{1.73_m2} (ref >59)
GFR calc non Af Amer: 120 mL/min/{1.73_m2} (ref >59)
Globulin, Total: 2.1 g/dL (ref 1.5–4.5)
Glucose: 102 mg/dL — ABNORMAL HIGH (ref 65–99)
Potassium: 4.2 mmol/L (ref 3.5–5.2)
Sodium: 139 mmol/L (ref 134–144)
Total Protein: 6.1 g/dL (ref 6.0–8.5)

## 2019-01-19 LAB — CBC WITH DIFFERENTIAL/PLATELET
Basophils Absolute: 0.1 10*3/uL (ref 0.0–0.2)
Basos: 1 %
EOS (ABSOLUTE): 0.2 10*3/uL (ref 0.0–0.4)
Eos: 3 %
Hematocrit: 43.3 % (ref 34.0–46.6)
Hemoglobin: 14 g/dL (ref 11.1–15.9)
Immature Grans (Abs): 0 10*3/uL (ref 0.0–0.1)
Immature Granulocytes: 1 %
Lymphocytes Absolute: 2 10*3/uL (ref 0.7–3.1)
Lymphs: 23 %
MCH: 28.1 pg (ref 26.6–33.0)
MCHC: 32.3 g/dL (ref 31.5–35.7)
MCV: 87 fL (ref 79–97)
Monocytes Absolute: 0.4 10*3/uL (ref 0.1–0.9)
Monocytes: 4 %
Neutrophils Absolute: 6.1 10*3/uL (ref 1.4–7.0)
Neutrophils: 68 %
Platelets: 300 10*3/uL (ref 150–450)
RBC: 4.98 x10E6/uL (ref 3.77–5.28)
RDW: 13.6 % (ref 11.7–15.4)
WBC: 8.8 10*3/uL (ref 3.4–10.8)

## 2019-01-19 LAB — LIPID PANEL
Chol/HDL Ratio: 2.4 ratio (ref 0.0–4.4)
Cholesterol, Total: 150 mg/dL (ref 100–199)
HDL: 62 mg/dL (ref >39)
LDL Calculated: 76 mg/dL (ref 0–99)
Triglycerides: 60 mg/dL (ref 0–149)
VLDL Cholesterol Cal: 12 mg/dL (ref 5–40)

## 2019-01-19 LAB — HEMOGLOBIN A1C
Est. average glucose Bld gHb Est-mCnc: 103 mg/dL
Hgb A1c MFr Bld: 5.2 % (ref 4.8–5.6)

## 2019-01-19 LAB — CYTOLOGY - PAP
Adequacy: ABSENT
Diagnosis: NEGATIVE
HPV: NOT DETECTED

## 2019-01-19 LAB — TSH: TSH: 3.18 u[IU]/mL (ref 0.450–4.500)

## 2019-01-20 ENCOUNTER — Telehealth: Payer: Self-pay

## 2019-01-20 NOTE — Telephone Encounter (Signed)
-----   Message from Mar Daring, [place] sent at 01/19/2019  5:17 PM EDT ----- Pap is normal, HPV negative.  Will repeat in 3-5 years.

## 2019-01-20 NOTE — Telephone Encounter (Signed)
Patient advised as directed below. 

## 2019-01-26 ENCOUNTER — Other Ambulatory Visit: Payer: Self-pay | Admitting: Internal Medicine

## 2019-01-27 ENCOUNTER — Other Ambulatory Visit: Payer: Self-pay | Admitting: Pharmacist

## 2019-01-27 MED ORDER — COSENTYX SENSOREADY (300 MG) 150 MG/ML ~~LOC~~ SOAJ: 2.0000 "pen " | SUBCUTANEOUS | 5 refills | Status: DC

## 2019-01-27 MED FILL — COSENTYX 300 MG DOSE-2 PENS: 150 | 28 days supply | Qty: 2 | Fill #0

## 2019-02-22 MED FILL — COSENTYX 300 MG DOSE-2 PENS: 150 | 28 days supply | Qty: 2 | Fill #1

## 2019-03-13 ENCOUNTER — Encounter: Payer: Self-pay | Admitting: Dietician

## 2019-03-13 ENCOUNTER — Other Ambulatory Visit: Payer: Self-pay

## 2019-03-13 ENCOUNTER — Encounter: Payer: 59 | Attending: Physician Assistant | Admitting: Dietician

## 2019-03-13 VITALS — Ht 63.0 in | Wt 321.2 lb

## 2019-03-13 DIAGNOSIS — Z713 Dietary counseling and surveillance: Secondary | ICD-10-CM

## 2019-03-13 NOTE — Patient Instructions (Signed)
   Great job making healthy food choices and exercising regularly! Keep up the awesome work!  Plan for best balance with meals by including a protein source, controlled carb portions, and plenty of low-carb veggies and some fruits.

## 2019-03-13 NOTE — Progress Notes (Signed)
[place] Employee "self referral" nutrition session: Start time: 1330   End time: 1415  Height: 5'3" Weight: 321.1lbs  Met with employee to discuss his/her nutritional concerns and diet history.   Diet history:   Patient reports working on [employer] program, starting 08/2018 with some backsliding since COVID-19 restrictions, but has now resumed healthy habits and has been losing weight gradually.   She has significantly increased exercise in the past 6 weeks.   Typical eating pattern: Breakfast: 8-9am 2 nutrigrain waffles + protein peaut butter or plain; premier shake; [place] bacon (not many eggs) Snack: veggie straws, 2 lite cheese sticks, or protein shake Lunch: 12-1pm bringing from home; chicken with cheese, broccoli, some potatoes, occ pita bread Snack: same as am; indiv bag skinny pop popcorn Supper: 6-7pm salads with chicken; air-fried shrimp, air fried [place] fries Snack: rice cakes (2) with American nut butter (higher protein peanut butter) Beverages: water 64oz daily, 1-2 diet dr. Malachi Bonds or sparkling water; stopped coffee due to using too much creamer  Physical Activity:  boot camp high intensity interval training 3x a week for 60 minutes + cardio class 45 minutes 2x a week   Education topics covered during this visit:  General nutrition/ Healthy eating  Exercise  Diabetes prevention  Weight Concerns   Educational resources provided:  Museum/gallery conservator with food lists  Sample menus    Additional Comments:  Patient's weight is about 7lbs less than last year, and she has maintained/ resumed some habits discussed during nutrition visits last year, 03/2018.   Recent increase in HbA1C has spurred her on to renew efforts at weight loss and healthy lifestyle.    Plan:   Continue with current eating pattern and regular exercise.  Return for follow-up on 03/23/19.

## 2019-03-22 MED FILL — COSENTYX 300 MG DOSE-2 PENS: 150 | 28 days supply | Qty: 2 | Fill #2

## 2019-03-23 ENCOUNTER — Other Ambulatory Visit: Payer: Self-pay | Admitting: Physician Assistant

## 2019-03-23 ENCOUNTER — Encounter: Payer: Self-pay | Admitting: Dietician

## 2019-03-23 ENCOUNTER — Encounter: Payer: 59 | Attending: Physician Assistant | Admitting: Dietician

## 2019-03-23 VITALS — Ht 63.0 in | Wt 325.3 lb

## 2019-03-23 DIAGNOSIS — Z713 Dietary counseling and surveillance: Secondary | ICD-10-CM

## 2019-03-23 DIAGNOSIS — F419 Anxiety disorder, unspecified: Secondary | ICD-10-CM

## 2019-03-23 NOTE — Patient Instructions (Signed)
   Use phone app or paper journal to track and monitor food intake, make adjustments as needed to keep calories and/or weight watchers points in control.   Keep up your great exercise routine!

## 2019-03-23 NOTE — Progress Notes (Signed)
[place] Employee "self referral" nutrition session: Start time: 1620   End time: 1640  Height: 5'3" Weight: 325.3lbs  Met with employee to discuss his/her nutritional concerns and diet history.   Progress:  Patient reports stressful week, and has had some increased eating, incl family birthday celebration so ate some cake.  She reports clothing is fitting more loosely than several months ago -- has been exercising regularly x2 months now.   Continues to generally make healthy food choices, emphasizing lean proteins and low-carb vegetables with controlled portions of starches. Drinks plenty of daily water and other fluids.   Typical eating pattern: 3 meals and 2-3 snacks, see note from 03/13/19   Physical Activity: boot camp and/or walking 60 minutes, 5-6 days per week  Education topics covered during this visit:  Weight Concerns: managing difficult eating situations    Additional Comments: Checked body composition on In[employer] scale.   Plan: Set goals as listed in patient instructions.  Next follow-up is scheduled for 04/06/19 at 4:45pm.

## 2019-03-24 ENCOUNTER — Other Ambulatory Visit: Payer: Self-pay

## 2019-03-28 DIAGNOSIS — M25462 Effusion, left knee: Secondary | ICD-10-CM | POA: Diagnosis not present

## 2019-03-28 DIAGNOSIS — M1712 Unilateral primary osteoarthritis, left knee: Secondary | ICD-10-CM | POA: Diagnosis not present

## 2019-03-28 DIAGNOSIS — M25562 Pain in left knee: Secondary | ICD-10-CM | POA: Diagnosis not present

## 2019-04-06 ENCOUNTER — Encounter: Payer: 59 | Admitting: Dietician

## 2019-04-06 ENCOUNTER — Other Ambulatory Visit: Payer: Self-pay

## 2019-04-06 ENCOUNTER — Encounter: Payer: Self-pay | Admitting: Dietician

## 2019-04-06 VITALS — Ht 62.0 in | Wt 321.4 lb

## 2019-04-06 DIAGNOSIS — Z713 Dietary counseling and surveillance: Secondary | ICD-10-CM

## 2019-04-06 NOTE — Progress Notes (Signed)
[place] Employee "self referral" nutrition session: Start time: 1700   End time: 3159  Height: 5'2" Weight: 321.4lbs  Met with employee to discuss his/her nutritional concerns and diet history.   Progress:   Reports better food choices, smaller portions when eating any sweets; fruit or salad with sandwich rather than fries/ chips   Patient reports instances of empowered decisions by throwing away 1/2 dessert portion, pre-tracking food intake to help stick with healthy plans even when she has had a stressful day.  She continues to exercise significantly on a daily basis  She has decreased 1 clothing size  Typical eating pattern: Lean protein + low-carb vegetables + controlled portions of starches Beverages: water,    Education topics covered during this visit:  General nutrition/ Healthy eating  Exercise  Weight Concerns   Educational resources provided:  InBody results   Additional Comments:  Weight loss of 4+lbs in the past 2 weeks  Patient is making healthy choices and decisions and is motivated to continue.    Plan:  Stop in for InBody weight and body composition check once a month

## 2019-04-24 MED FILL — COSENTYX 300 MG DOSE-2 PENS: 150 | 28 days supply | Qty: 2 | Fill #3

## 2019-05-17 MED FILL — COSENTYX 300 MG DOSE-2 PENS: 150 | 28 days supply | Qty: 2 | Fill #4

## 2019-06-15 MED FILL — COSENTYX 300 MG DOSE-2 PENS: 150 | 28 days supply | Qty: 2 | Fill #5

## 2019-06-20 DIAGNOSIS — H5203 Hypermetropia, bilateral: Secondary | ICD-10-CM | POA: Diagnosis not present

## 2019-06-22 DIAGNOSIS — M25561 Pain in right knee: Secondary | ICD-10-CM | POA: Diagnosis not present

## 2019-06-22 DIAGNOSIS — G8929 Other chronic pain: Secondary | ICD-10-CM | POA: Diagnosis not present

## 2019-06-22 DIAGNOSIS — L4050 Arthropathic psoriasis, unspecified: Secondary | ICD-10-CM | POA: Diagnosis not present

## 2019-06-22 DIAGNOSIS — M25562 Pain in left knee: Secondary | ICD-10-CM | POA: Diagnosis not present

## 2019-07-11 ENCOUNTER — Ambulatory Visit (HOSPITAL_BASED_OUTPATIENT_CLINIC_OR_DEPARTMENT_OTHER): Payer: 59 | Admitting: Pharmacist

## 2019-07-11 ENCOUNTER — Other Ambulatory Visit: Payer: Self-pay

## 2019-07-11 DIAGNOSIS — Z79899 Other long term (current) drug therapy: Secondary | ICD-10-CM

## 2019-07-11 NOTE — Progress Notes (Signed)
Virtual Visit via Telephone Note  I connected with Azita Mckenny Cumbo, on 07/11/2019 at 1:15 PM by telephonedue to the COVID-19 pandemic and verified that I am speaking with the correct person using two identifiers.  Consent: I discussed the limitations, risks, security and privacy concerns of performing an evaluation and management service by telephone and the availability of in person appointments. I also discussed with the patient that there may be a patient responsible charge related to this service. The patient expressed understanding and agreed to proceed.  Location of Patient: Work  Tree surgeon: Clinic  Persons participating in Telemedicine visit: Patient Myself   S: Patient presents today for review of their specialty medication.   Patient is currently taking Cosentyx for psoriatic arthritis. Patient is managed by Dr. Jefm Bryant for this.   Adherence: confirms  Efficacy: very pleased with results; reports 100% clearer skin and about 90% reduction in joint pain/inflammation.  Current adverse effects: S/sx of infection: denies GI upset: denies Headache: denies S/sx of hypersensitivity: denies  O:   Lab Results  Component Value Date   WBC 8.8 01/18/2019   HGB 14.0 01/18/2019   HCT 43.3 01/18/2019   MCV 87 01/18/2019   PLT 300 01/18/2019      Chemistry      Component Value Date/Time   NA 139 01/18/2019 0955   K 4.2 01/18/2019 0955   CL 100 01/18/2019 0955   CO2 25 01/18/2019 0955   BUN 7 01/18/2019 0955   CREATININE 0.51 (L) 01/18/2019 0955      Component Value Date/Time   CALCIUM 8.9 01/18/2019 0955   ALKPHOS 102 01/18/2019 0955   AST 18 01/18/2019 0955   ALT 20 01/18/2019 0955   BILITOT 0.3 01/18/2019 0955      A/P: 1. Medication review: Patient is currently on Cosentyx for psoriatic arthritis. Reviewed the medication with the patient, including the following: Cosentyx is a monoclonal antibody used in the treatment of ankylosing  spondylitis, psoriasis, and psoriatic arthritis. The injection is subq and the medication should be allowed to reach room temp prior to injecting. Injection sites should be rotated. Possible adverse effects include headaches, GI upset, increased risk of infection and hypersensitivity reactions. No recommendations for any changes at this time.   Benard Halsted, PharmD, [place] [phone]

## 2019-07-18 DIAGNOSIS — M170 Bilateral primary osteoarthritis of knee: Secondary | ICD-10-CM | POA: Diagnosis not present

## 2019-07-18 DIAGNOSIS — M25461 Effusion, right knee: Secondary | ICD-10-CM | POA: Diagnosis not present

## 2019-07-18 DIAGNOSIS — M25462 Effusion, left knee: Secondary | ICD-10-CM | POA: Diagnosis not present

## 2019-07-18 DIAGNOSIS — M25561 Pain in right knee: Secondary | ICD-10-CM | POA: Diagnosis not present

## 2019-07-19 ENCOUNTER — Other Ambulatory Visit: Payer: Self-pay | Admitting: Pharmacist

## 2019-07-19 MED ORDER — COSENTYX SENSOREADY (300 MG) 150 MG/ML ~~LOC~~ SOAJ: 2.0000 "pen " | SUBCUTANEOUS | 5 refills | Status: DC

## 2019-07-19 MED FILL — COSENTYX 300 MG DOSE-2 PENS: 150 | 28 days supply | Qty: 2 | Fill #0

## 2019-07-21 DIAGNOSIS — M25571 Pain in right ankle and joints of right foot: Secondary | ICD-10-CM | POA: Diagnosis not present

## 2019-07-21 DIAGNOSIS — L4050 Arthropathic psoriasis, unspecified: Secondary | ICD-10-CM | POA: Diagnosis not present

## 2019-07-27 DIAGNOSIS — D485 Neoplasm of uncertain behavior of skin: Secondary | ICD-10-CM | POA: Diagnosis not present

## 2019-07-27 DIAGNOSIS — D1801 Hemangioma of skin and subcutaneous tissue: Secondary | ICD-10-CM | POA: Diagnosis not present

## 2019-07-27 DIAGNOSIS — D2230 Melanocytic nevi of unspecified part of face: Secondary | ICD-10-CM | POA: Diagnosis not present

## 2019-07-27 DIAGNOSIS — L400 Psoriasis vulgaris: Secondary | ICD-10-CM | POA: Diagnosis not present

## 2019-07-27 DIAGNOSIS — D225 Melanocytic nevi of trunk: Secondary | ICD-10-CM | POA: Diagnosis not present

## 2019-07-27 DIAGNOSIS — D239 Other benign neoplasm of skin, unspecified: Secondary | ICD-10-CM

## 2019-07-27 DIAGNOSIS — D2261 Melanocytic nevi of right upper limb, including shoulder: Secondary | ICD-10-CM | POA: Diagnosis not present

## 2019-07-27 DIAGNOSIS — L603 Nail dystrophy: Secondary | ICD-10-CM | POA: Diagnosis not present

## 2019-07-27 DIAGNOSIS — L4052 Psoriatic arthritis mutilans: Secondary | ICD-10-CM | POA: Diagnosis not present

## 2019-07-27 DIAGNOSIS — D229 Melanocytic nevi, unspecified: Secondary | ICD-10-CM | POA: Diagnosis not present

## 2019-07-27 DIAGNOSIS — Z1283 Encounter for screening for malignant neoplasm of skin: Secondary | ICD-10-CM | POA: Diagnosis not present

## 2019-07-27 HISTORY — DX: Other benign neoplasm of skin, unspecified: D23.9

## 2019-08-08 MED FILL — CELECOXIB 200 MG CAP: 200 | 30 days supply | Qty: 30 | Fill #0

## 2019-08-08 MED FILL — sulfaSALAzine 500 MG TABS: 500 | 30 days supply | Qty: 60 | Fill #0

## 2019-08-09 MED FILL — COSENTYX 300 MG DOSE-2 PENS: 150 | 28 days supply | Qty: 2 | Fill #1

## 2019-08-18 DIAGNOSIS — Z23 Encounter for immunization: Secondary | ICD-10-CM | POA: Diagnosis not present

## 2019-09-07 MED FILL — COSENTYX 300 MG DOSE-2 PENS: 150 | 28 days supply | Qty: 2 | Fill #2

## 2019-09-15 DIAGNOSIS — Z23 Encounter for immunization: Secondary | ICD-10-CM | POA: Diagnosis not present

## 2019-09-18 DIAGNOSIS — M76892 Other specified enthesopathies of left lower limb, excluding foot: Secondary | ICD-10-CM | POA: Diagnosis not present

## 2019-09-18 DIAGNOSIS — M25552 Pain in left hip: Secondary | ICD-10-CM | POA: Diagnosis not present

## 2019-09-18 DIAGNOSIS — M7062 Trochanteric bursitis, left hip: Secondary | ICD-10-CM | POA: Diagnosis not present

## 2019-09-18 DIAGNOSIS — M7632 Iliotibial band syndrome, left leg: Secondary | ICD-10-CM | POA: Diagnosis not present

## 2019-10-05 MED FILL — COSENTYX 300 MG DOSE-2 PENS: 150 | 28 days supply | Qty: 2 | Fill #3

## 2019-10-27 ENCOUNTER — Telehealth: Payer: 59 | Admitting: Physician Assistant

## 2019-10-27 DIAGNOSIS — R21 Rash and other nonspecific skin eruption: Secondary | ICD-10-CM | POA: Diagnosis not present

## 2019-10-27 DIAGNOSIS — L304 Erythema intertrigo: Secondary | ICD-10-CM | POA: Diagnosis not present

## 2019-10-27 MED ORDER — KETOCONAZOLE 2 % EX GEL: 1.0000 "application " | Freq: Every day | CUTANEOUS | 0 refills | Status: DC

## 2019-10-27 MED ORDER — FLUCONAZOLE 150 MG PO TABS: 150.0000 mg | ORAL_TABLET | Freq: Once | ORAL | 0 refills | Status: AC

## 2019-10-27 NOTE — Progress Notes (Signed)
E Visit for Rash  We are sorry that you are not feeling well. Here is how we plan to help!    Based upon your presentation it appears you have a fungal infection. I have prescribed: fluconazole 150 mg once weekly for four weeks  Ketoconazole: Apply once daily to cover the affected and immediate surrounding area for 2 weeks  HOME CARE: Typical beneficial practices include: ? Daily cleansing of skin with a mild cleanser followed by drying of affected area with a hair dryer on a cool setting ? Aeration of affected area when feasible ? Daily application of drying powders, such as powders composed of microporous cellulose ? Use of absorbent material or clothing, such as cotton or merino wool, to separate skin in folds ? Application of barrier creams in areas that may come in contact with urine or feces ? Weight loss in overweight or obese patients ? Appropriate treatment of coexisting diabetes mellitus   GET HELP RIGHT AWAY IF:   Symptoms don't go away after treatment.  Severe itching that persists.  If you rash spreads or swells.  If you rash begins to smell.  If it blisters and opens or develops a yellow-brown crust.  You develop a fever.  You have a sore throat.  You become short of breath.  MAKE SURE YOU:  Understand these instructions. Will watch your condition. Will get help right away if you are not doing well or get worse.  Thank you for choosing an e-visit. Your e-visit answers were reviewed by a board certified advanced clinical practitioner to complete your personal care plan. Depending upon the condition, your plan could have included both over the counter or prescription medications. Please review your pharmacy choice. Be sure that the pharmacy you have chosen is open so that you can pick up your prescription now.  If there is a problem you may message your provider in [place] to have the prescription routed to another pharmacy. Your safety is important to [place].  If you have drug allergies check your prescription carefully.  For the next 24 hours, you can use MyChart to ask questions about today's visit, request a non-urgent call back, or ask for a work or school excuse from your e-visit provider. You will get an email in the next two days asking about your experience. I hope that your e-visit has been valuable and will speed your recovery.    Greater than 5 minutes, yet less than 10 minutes of time have been spent researching, coordinating and implementing care for this patient today.

## 2019-11-02 MED FILL — COSENTYX 300 MG DOSE-2 PENS: 150 | 28 days supply | Qty: 2 | Fill #4

## 2019-11-14 ENCOUNTER — Ambulatory Visit (INDEPENDENT_AMBULATORY_CARE_PROVIDER_SITE_OTHER): Payer: 59 | Admitting: Dermatology

## 2019-11-14 ENCOUNTER — Encounter: Payer: Self-pay | Admitting: Dermatology

## 2019-11-14 ENCOUNTER — Other Ambulatory Visit: Payer: Self-pay

## 2019-11-14 DIAGNOSIS — D239 Other benign neoplasm of skin, unspecified: Secondary | ICD-10-CM

## 2019-11-14 DIAGNOSIS — D2261 Melanocytic nevi of right upper limb, including shoulder: Secondary | ICD-10-CM

## 2019-11-14 DIAGNOSIS — L988 Other specified disorders of the skin and subcutaneous tissue: Secondary | ICD-10-CM | POA: Diagnosis not present

## 2019-11-14 MED ORDER — MUPIROCIN 2 % EX OINT: 1.0000 "application " | TOPICAL_OINTMENT | Freq: Every day | CUTANEOUS | 0 refills | Status: DC

## 2019-11-14 NOTE — Progress Notes (Signed)
   Follow-Up Visit   Subjective  Kelsey Randolph is a 44 y.o. female who presents for the following: Severe Dysplastic Nevus (R lat bicep - pt presents for surgery today).   The following portions of the chart were reviewed this encounter and updated as appropriate:     Review of Systems: No other skin or systemic complaints.  Objective  Well appearing patient in no apparent distress; mood and affect are within normal limits.  A focused examination was performed including R arm. Relevant physical exam findings are noted in the Assessment and Plan.  Objective  R lateral bicep: Pink bx site  Assessment & Plan  Dysplastic nevus R lateral bicep  Skin excision - R lateral bicep  Lesion length (cm):  1.1 Lesion width (cm):  0.9 Margin per side (cm):  0.2 Total excision diameter (cm):  1.5 Informed consent: discussed and consent obtained   Timeout: patient name, date of birth, surgical site, and procedure verified   Procedure prep:  Patient was prepped and draped in usual sterile fashion Prep type:  Isopropyl alcohol and povidone-iodine Anesthesia: the lesion was anesthetized in a standard fashion   Anesthetic:  1% lidocaine w/ epinephrine 1-100,000 buffered w/ 8.4% NaHCO3 (14cc) Instrument used: #15 blade   Hemostasis achieved with: pressure   Hemostasis achieved with comment:  Electrocautery Outcome: patient tolerated procedure well with no complications   Dressing: Mupirocin.    Skin repair - R lateral bicep Complexity:  Complex Final length (cm):  5.5 Reason for type of repair: reduce tension to allow closure, reduce the risk of dehiscence, infection, and necrosis, reduce subcutaneous dead space and avoid a hematoma, allow closure of the large defect, preserve normal anatomy, preserve normal anatomical and functional relationships and enhance both functionality and cosmetic results   Undermining: area extensively undermined   Undermining comment:  Undermining Defect  2.0cm Subcutaneous layers (deep stitches):  Suture size:  3-0 Suture type: Vicryl (polyglactin 910)   Subcutaneous suture technique: Inverted Dermal. Fine/surface layer approximation (top stitches):  Suture size:  3-0 Suture type comment:  Nylon Stitches: simple running   Stitches comment:  Nylon Suture removal (days):  7 Hemostasis achieved with: suture and pressure Outcome: patient tolerated procedure well with no complications   Post-procedure details: sterile dressing applied and wound care instructions given   Dressing type: bandage and pressure dressing (Mupirocin)    mupirocin ointment (BACTROBAN) 2 % - R lateral bicep  Specimen 1 - Surgical pathology Differential Diagnosis: Severe Dysplastic Nevus Check Margins: yes Pink bx site 1.1 x 0.9cm Previous bx [id number]  Return in about 1 week (around 11/21/2019) for sr.   I, [name], RMA, am acting as scribe for Sarina Ser, MD .

## 2019-11-14 NOTE — Patient Instructions (Signed)

## 2019-11-15 ENCOUNTER — Telehealth: Payer: Self-pay

## 2019-11-15 DIAGNOSIS — M1712 Unilateral primary osteoarthritis, left knee: Secondary | ICD-10-CM | POA: Diagnosis not present

## 2019-11-15 DIAGNOSIS — M25561 Pain in right knee: Secondary | ICD-10-CM | POA: Diagnosis not present

## 2019-11-15 DIAGNOSIS — M25562 Pain in left knee: Secondary | ICD-10-CM | POA: Diagnosis not present

## 2019-11-15 DIAGNOSIS — M25462 Effusion, left knee: Secondary | ICD-10-CM | POA: Diagnosis not present

## 2019-11-15 DIAGNOSIS — G8929 Other chronic pain: Secondary | ICD-10-CM | POA: Diagnosis not present

## 2019-11-15 NOTE — Telephone Encounter (Signed)
Left pt msg to call if any problems after yesterdays procedure./sh

## 2019-11-21 ENCOUNTER — Ambulatory Visit (INDEPENDENT_AMBULATORY_CARE_PROVIDER_SITE_OTHER): Payer: 59 | Admitting: Dermatology

## 2019-11-21 ENCOUNTER — Other Ambulatory Visit: Payer: Self-pay

## 2019-11-21 DIAGNOSIS — Z86018 Personal history of other benign neoplasm: Secondary | ICD-10-CM

## 2019-11-21 DIAGNOSIS — Z4802 Encounter for removal of sutures: Secondary | ICD-10-CM

## 2019-11-21 DIAGNOSIS — D2371 Other benign neoplasm of skin of right lower limb, including hip: Secondary | ICD-10-CM

## 2019-11-21 DIAGNOSIS — D239 Other benign neoplasm of skin, unspecified: Secondary | ICD-10-CM

## 2019-11-21 NOTE — Progress Notes (Signed)
   Follow-Up Visit   Subjective  Kelsey Randolph is a 44 y.o. female who presents for the following: Suture / Staple Removal (1 week f/u R lateral bicep severe Dysplastic nevus excised 11-14-2019).   The following portions of the chart were reviewed this encounter and updated as appropriate:     Review of Systems: No other skin or systemic complaints.  Objective  Well appearing patient in no apparent distress; mood and affect are within normal limits.  A focused examination was performed including R bicep, R lower leg . Relevant physical exam findings are noted in the Assessment and Plan.  Objective  Right Lower Leg - Posterior: Firm pink/brown papulenodule with dimple sign.   Objective  Right Upper Arm - Anterior: Scar with no evidence of recurrence.   Assessment & Plan  Dermatofibroma Right Lower Leg - Posterior  Observe   Hx of dysplastic nevus Right Upper Arm - Anterior MARGINS FREE Biopsy results discussed   Sutures removed, steri strips applied   Return for as scheduled for [facility]SE.   IMarye Round, CMA, am acting as scribe for Sarina Ser, MD . Documentation: I have reviewed the above documentation for accuracy and completeness, and I agree with the above.  Sarina Ser, MD

## 2019-11-30 MED FILL — COSENTYX 300 MG DOSE-2 PENS: 150 | 28 days supply | Qty: 2 | Fill #5

## 2019-12-12 DIAGNOSIS — M7752 Other enthesopathy of left foot: Secondary | ICD-10-CM | POA: Diagnosis not present

## 2019-12-12 DIAGNOSIS — M79672 Pain in left foot: Secondary | ICD-10-CM | POA: Diagnosis not present

## 2019-12-26 ENCOUNTER — Other Ambulatory Visit: Payer: Self-pay | Admitting: Internal Medicine

## 2019-12-28 ENCOUNTER — Other Ambulatory Visit: Payer: Self-pay | Admitting: Pharmacist

## 2019-12-28 MED ORDER — COSENTYX SENSOREADY (300 MG) 150 MG/ML ~~LOC~~ SOAJ: 2.0000 "pen " | SUBCUTANEOUS | 7 refills | Status: DC

## 2020-01-01 MED FILL — COSENTYX 300 MG DOSE-2 PENS: 150 | 28 days supply | Qty: 2 | Fill #0

## 2020-01-02 DIAGNOSIS — M19072 Primary osteoarthritis, left ankle and foot: Secondary | ICD-10-CM | POA: Diagnosis not present

## 2020-01-02 DIAGNOSIS — M79672 Pain in left foot: Secondary | ICD-10-CM | POA: Diagnosis not present

## 2020-01-02 DIAGNOSIS — M7752 Other enthesopathy of left foot: Secondary | ICD-10-CM | POA: Diagnosis not present

## 2020-01-25 MED FILL — COSENTYX 300 MG DOSE-2 PENS: 150 | 28 days supply | Qty: 2 | Fill #1

## 2020-02-05 ENCOUNTER — Other Ambulatory Visit: Payer: Self-pay

## 2020-02-05 ENCOUNTER — Ambulatory Visit (INDEPENDENT_AMBULATORY_CARE_PROVIDER_SITE_OTHER): Payer: 59 | Admitting: Physician Assistant

## 2020-02-05 ENCOUNTER — Other Ambulatory Visit: Payer: Self-pay | Admitting: Physician Assistant

## 2020-02-05 ENCOUNTER — Encounter: Payer: Self-pay | Admitting: Physician Assistant

## 2020-02-05 VITALS — BP 122/70 | HR 84 | Temp 97.1°F | Resp 16 | Ht 63.0 in | Wt 313.0 lb

## 2020-02-05 DIAGNOSIS — K580 Irritable bowel syndrome with diarrhea: Secondary | ICD-10-CM

## 2020-02-05 DIAGNOSIS — R7303 Prediabetes: Secondary | ICD-10-CM | POA: Diagnosis not present

## 2020-02-05 DIAGNOSIS — L304 Erythema intertrigo: Secondary | ICD-10-CM | POA: Diagnosis not present

## 2020-02-05 DIAGNOSIS — Z6841 Body Mass Index (BMI) 40.0 and over, adult: Secondary | ICD-10-CM

## 2020-02-05 DIAGNOSIS — Z0000 Encounter for general adult medical examination without abnormal findings: Secondary | ICD-10-CM

## 2020-02-05 DIAGNOSIS — Z1159 Encounter for screening for other viral diseases: Secondary | ICD-10-CM | POA: Diagnosis not present

## 2020-02-05 DIAGNOSIS — E559 Vitamin D deficiency, unspecified: Secondary | ICD-10-CM

## 2020-02-05 DIAGNOSIS — E039 Hypothyroidism, unspecified: Secondary | ICD-10-CM

## 2020-02-05 DIAGNOSIS — F419 Anxiety disorder, unspecified: Secondary | ICD-10-CM

## 2020-02-05 DIAGNOSIS — L4050 Arthropathic psoriasis, unspecified: Secondary | ICD-10-CM | POA: Diagnosis not present

## 2020-02-05 DIAGNOSIS — F3342 Major depressive disorder, recurrent, in full remission: Secondary | ICD-10-CM | POA: Diagnosis not present

## 2020-02-05 DIAGNOSIS — Z1239 Encounter for other screening for malignant neoplasm of breast: Secondary | ICD-10-CM

## 2020-02-05 MED ORDER — ESCITALOPRAM OXALATE 20 MG PO TABS: 20.0000 mg | ORAL_TABLET | Freq: Every day | ORAL | 3 refills | Status: DC

## 2020-02-05 MED ORDER — DICYCLOMINE HCL 10 MG PO CAPS: 10.0000 mg | ORAL_CAPSULE | Freq: Three times a day (TID) | ORAL | 5 refills | Status: DC

## 2020-02-05 MED ORDER — CLOTRIMAZOLE-BETAMETHASONE 1-0.05 % EX CREA: 1.0000 "application " | TOPICAL_CREAM | Freq: Two times a day (BID) | CUTANEOUS | 0 refills | Status: DC

## 2020-02-05 MED ORDER — LEVOTHYROXINE SODIUM 50 MCG PO TABS: 50.0000 ug | ORAL_TABLET | Freq: Every day | ORAL | 3 refills | Status: DC

## 2020-02-05 MED ORDER — KETOCONAZOLE 200 MG PO TABS: 200.0000 mg | ORAL_TABLET | Freq: Every day | ORAL | 0 refills | Status: DC

## 2020-02-05 MED ORDER — ALPRAZOLAM 0.5 MG PO TABS: ORAL_TABLET | ORAL | 1 refills | Status: DC

## 2020-02-05 NOTE — Patient Instructions (Signed)
[facility] [address] [place],  [place]  [address] Main: [phone]  Health Maintenance, Female Adopting a healthy lifestyle and getting preventive care are important in promoting health and wellness. Ask your health care provider about:  The right schedule for you to have regular tests and exams.  Things you can do on your own to prevent diseases and keep yourself healthy. What should I know about diet, weight, and exercise? Eat a healthy diet   Eat a diet that includes plenty of vegetables, fruits, low-fat dairy products, and lean protein.  Do not eat a lot of foods that are high in solid fats, added sugars, or sodium. Maintain a healthy weight Body mass index (BMI) is used to identify weight problems. It estimates body fat based on height and weight. Your health care provider can help determine your BMI and help you achieve or maintain a healthy weight. Get regular exercise Get regular exercise. This is one of the most important things you can do for your health. Most adults should:  Exercise for at least 150 minutes each week. The exercise should increase your heart rate and make you sweat (moderate-intensity exercise).  Do strengthening exercises at least twice a week. This is in addition to the moderate-intensity exercise.  Spend less time sitting. Even light physical activity can be beneficial. Watch cholesterol and blood lipids Have your blood tested for lipids and cholesterol at 42 years of age, then have this test every 5 years. Have your cholesterol levels checked more often if:  Your lipid or cholesterol levels are high.  You are older than 42 years of age.  You are at high risk for heart disease. What should I know about cancer screening? Depending on your health history and family history, you may need to have cancer screening at various ages. This may include screening for:  Breast cancer.  Cervical  cancer.  Colorectal cancer.  Skin cancer.  Lung cancer. What should I know about heart disease, diabetes, and high blood pressure? Blood pressure and heart disease  High blood pressure causes heart disease and increases the risk of stroke. This is more likely to develop in people who have high blood pressure readings, are of African descent, or are overweight.  Have your blood pressure checked: ? Every 3-5 years if you are 18-39 years of age. ? Every year if you are 40 years old or older. Diabetes Have regular diabetes screenings. This checks your fasting blood sugar level. Have the screening done:  Once every three years after age 40 if you are at a normal weight and have a low risk for diabetes.  More often and at a younger age if you are overweight or have a high risk for diabetes. What should I know about preventing infection? Hepatitis B If you have a higher risk for hepatitis B, you should be screened for this virus. Talk with your health care provider to find out if you are at risk for hepatitis B infection. Hepatitis C Testing is recommended for:  Everyone born from 1945 through 1965.  Anyone with known risk factors for hepatitis C. Sexually transmitted infections (STIs)  Get screened for STIs, including gonorrhea and chlamydia, if: ? You are sexually active and are younger than 42 years of age. ? You are older than 42 years of age and your health care provider tells you that you are at risk for this type of infection. ? Your sexual activity has changed since you   were last screened, and you are at increased risk for chlamydia or gonorrhea. Ask your health care provider if you are at risk.  Ask your health care provider about whether you are at high risk for HIV. Your health care provider may recommend a prescription medicine to help prevent HIV infection. If you choose to take medicine to prevent HIV, you should first get tested for HIV. You should then be tested every 3  months for as long as you are taking the medicine. Pregnancy  If you are about to stop having your period (premenopausal) and you may become pregnant, seek counseling before you get pregnant.  Take 400 to 800 micrograms (mcg) of folic acid every day if you become pregnant.  Ask for birth control (contraception) if you want to prevent pregnancy. Osteoporosis and menopause Osteoporosis is a disease in which the bones lose minerals and strength with aging. This can result in bone fractures. If you are 65 years old or older, or if you are at risk for osteoporosis and fractures, ask your health care provider if you should:  Be screened for bone loss.  Take a calcium or vitamin D supplement to lower your risk of fractures.  Be given hormone replacement therapy (HRT) to treat symptoms of menopause. Follow these instructions at home: Lifestyle  Do not use any products that contain nicotine or tobacco, such as cigarettes, e-cigarettes, and chewing tobacco. If you need help quitting, ask your health care provider.  Do not use street drugs.  Do not share needles.  Ask your health care provider for help if you need support or information about quitting drugs. Alcohol use  Do not drink alcohol if: ? Your health care provider tells you not to drink. ? You are pregnant, may be pregnant, or are planning to become pregnant.  If you drink alcohol: ? Limit how much you use to 0-1 drink a day. ? Limit intake if you are breastfeeding.  Be aware of how much alcohol is in your drink. In the [place] one drink equals one 12 oz bottle of beer (355 mL), one 5 oz glass of wine (148 mL), or one 1 oz glass of hard liquor (44 mL). General instructions  Schedule regular health, dental, and eye exams.  Stay current with your vaccines.  Tell your health care provider if: ? You often feel depressed. ? You have ever been abused or do not feel safe at home. Summary  Adopting a healthy lifestyle and getting  preventive care are important in promoting health and wellness.  Follow your health care provider's instructions about healthy diet, exercising, and getting tested or screened for diseases.  Follow your health care provider's instructions on monitoring your cholesterol and blood pressure. This information is not intended to replace advice given to you by your health care provider. Make sure you discuss any questions you have with your health care provider. Document Revised: 07/20/2018 Document Reviewed: 07/20/2018 Elsevier Patient Education  2020 [employer]  

## 2020-02-05 NOTE — Progress Notes (Signed)
Complete physical exam   Patient: Kelsey Randolph   DOB: [birth year]   44 y.o. Female  MRN: [id number] Visit Date: 02/05/2020  I,Sulibeya S Dimas,acting as a scribe for [employer], PA-C.,have documented all relevant documentation on the behalf of Mar Daring, PA-C,as directed by  Mar Daring, PA-C while in the presence of Mar Daring, PA-C.  Today's healthcare provider: Mar Daring, PA-C   Chief Complaint  Patient presents with  . Annual Exam   Subjective    Kelsey Randolph is a 44 y.o. female who presents today for a complete physical exam.  She reports consuming a general diet. Home exercise routine includes walking 1 hrs per days. She generally feels well. She reports sleeping well. She does have additional problems to discuss today.  HPI  01/18/2019 Pap/HPV-negative 10/21/2018 Mammogram-BI-RADS 1   Past Medical History:  Diagnosis Date  . Allergy   . Anxiety   . Arthritis    PSORIATIC  . Depression   . GERD (gastroesophageal reflux disease)    RARE-NO MEDS  . History of kidney stones 2004  . Hx of dysplastic nevus 07/16/2016   Right superior medial buttocks just lateral to superior crease. Mild atypia clost to deep margin  . Hx of dysplastic nevus 07/12/2017   Left upper back sup. scapula. Severe atypia with focal fibrosis, close to lateral margin. Excised: 09/28/2017. Margins free  . Hx of dysplastic nevus 07/12/2017   Left LQA periumbilical. Moderate atypia, irritated, close to lateral margin  . Hx of dysplastic nevus 07/27/2019, exc 11/14/19   Right lateral bicep. Severe atypia, close to margin  . Hypothyroidism   . PONV (postoperative nausea and vomiting)    WITH KIDNEY STONE AND TONSILLECTOMY BUT HAD NO N/V WITH HER LAST FOOT SURGERY IN 2013  . Psoriasis    Cosentyx   Past Surgical History:  Procedure Laterality Date  . FOOT SURGERY Left   . kidney stones removed  2004  . TARSAL TUNNEL RELEASE Left 04/16/2017    Procedure: TARSAL TUNNEL RELEASE;  Surgeon: Samara Deist, DPM;  Location: [facility] ORS;  Service: Podiatry;  Laterality: Left;  . TENDON REPAIR Left 04/16/2017   Procedure: FLEXOR TENDON REPAIR-SECONDARY ;  Surgeon: Samara Deist, DPM;  Location: [facility] ORS;  Service: Podiatry;  Laterality: Left;  . tonsillectomy and adenoidectomy  1993   Social History   Socioeconomic History  . Marital status: Married    Spouse name: Not on file  . Number of children: Not on file  . Years of education: Not on file  . Highest education level: Not on file  Occupational History    Employer: [employer]  Tobacco Use  . Smoking status: Former Smoker    Packs/day: 0.50    Years: 20.00    Pack years: 10.00    Types: Cigarettes    Quit date: 08/10/2015    Years since quitting: 4.4  . Smokeless tobacco: Never Used  Vaping Use  . Vaping Use: Never used  Substance and Sexual Activity  . Alcohol use: Yes    Comment: RARE  . Drug use: No  . Sexual activity: Not on file  Other Topics Concern  . Not on file  Social History Narrative  . Not on file   Social Determinants of Health   Financial Resource Strain:   . Difficulty of Paying Living Expenses:   Food Insecurity:   . Worried About Charity fundraiser in the Last Year:   .  Ran Out of Food in the Last Year:   Transportation Needs:   . Film/video editor (Medical):   Marland Kitchen Lack of Transportation (Non-Medical):   Physical Activity:   . Days of Exercise per Week:   . Minutes of Exercise per Session:   Stress:   . Feeling of Stress :   Social Connections:   . Frequency of Communication with Friends and Family:   . Frequency of Social Gatherings with Friends and Family:   . Attends Religious Services:   . Active Member of Clubs or Organizations:   . Attends Archivist Meetings:   Marland Kitchen Marital Status:   Intimate Partner Violence:   . Fear of Current or Ex-Partner:   . Emotionally Abused:   Marland Kitchen Physically Abused:   . Sexually Abused:     Family Status  Relation Name Status  . Mother  Alive  . Brother  Alive  . PGF  Deceased at age 88  . Father  Deceased at age 16       MI  . MGM  (Not Specified)   Family History  Problem Relation Age of Onset  . Depression Mother   . Hyperlipidemia Mother   . Hypertension Mother   . Healthy Brother   . Heart disease Paternal Grandfather        MI; CAD  . Heart disease Father   . Dementia Maternal Grandmother   . Diabetes Maternal Grandmother    No Known Allergies  Patient Care Team: Mar Daring, PA-C as PCP - General (Family Medicine)   Medications: Outpatient Medications Prior to Visit  Medication Sig  . acetaminophen (TYLENOL) 500 MG tablet Take 500 mg by mouth every 6 (six) hours as needed.  Marland Kitchen Apple Cid Vn-Grn Tea-Bit Or-Cr (APPLE CIDER VINEGAR PLUS) TABS Take by mouth.  Marland Kitchen b complex vitamins tablet Take 1 tablet by mouth daily.  . cetirizine (ZYRTEC ALLERGY) 10 MG tablet Take 10 mg by mouth daily.   . Cholecalciferol 1000 UNITS capsule Take 1,000 Units by mouth daily.   Marland Kitchen Ketoconazole 2 % GEL Apply 1 application topically daily. Cover the affected and immediate surrounding area for 2 weeks  . meloxicam (MOBIC) 15 MG tablet   . Secukinumab, 300 MG Dose, (COSENTYX SENSOREADY, 300 MG,) 150 MG/ML SOAJ Inject 2 pens into the skin every 28 (twenty-eight) days.  Marland Kitchen sulfaSALAzine (AZULFIDINE) 500 MG tablet Take 500 mg by mouth 2 (two) times daily.  . [DISCONTINUED] ALPRAZolam (XANAX) 0.5 MG tablet TAKE 1 TABLET BY MOUTH AT BEDTIME AS NEEDED FOR ANXIETY.  . [DISCONTINUED] escitalopram (LEXAPRO) 20 MG tablet Take 1 tablet (20 mg total) by mouth daily.  . [DISCONTINUED] levothyroxine (SYNTHROID) 50 MCG tablet Take 1 tablet (50 mcg total) by mouth daily before breakfast.  . [DISCONTINUED] mupirocin ointment (BACTROBAN) 2 % Apply 1 application topically daily. Qd to wound on right arm (Patient not taking: Reported on 02/05/2020)  . [DISCONTINUED] tobramycin-dexamethasone  (TOBRADEX) ophthalmic solution  (Patient not taking: Reported on 02/05/2020)   No facility-administered medications prior to visit.    Review of Systems  Constitutional: Negative.   HENT: Positive for hearing loss.   Eyes: Negative.   Respiratory: Negative.   Cardiovascular: Negative.   Gastrointestinal: Positive for constipation and diarrhea.  Endocrine: Negative.   Genitourinary: Negative.   Musculoskeletal: Negative.   Skin: Positive for rash.  Allergic/Immunologic: Positive for immunocompromised state.  Neurological: Negative.   Hematological: Negative.   Psychiatric/Behavioral: Negative.     Last CBC Lab  Results  Component Value Date   WBC 8.8 01/18/2019   HGB 14.0 01/18/2019   HCT 43.3 01/18/2019   MCV 87 01/18/2019   MCH 28.1 01/18/2019   RDW 13.6 01/18/2019   PLT 300 67/34/1937   Last metabolic panel Lab Results  Component Value Date   GLUCOSE 102 (H) 01/18/2019   NA 139 01/18/2019   K 4.2 01/18/2019   CL 100 01/18/2019   CO2 25 01/18/2019   BUN 7 01/18/2019   CREATININE 0.51 (L) 01/18/2019   GFRNONAA 120 01/18/2019   GFRAA 138 01/18/2019   CALCIUM 8.9 01/18/2019   PROT 6.1 01/18/2019   ALBUMIN 4.0 01/18/2019   LABGLOB 2.1 01/18/2019   AGRATIO 1.9 01/18/2019   BILITOT 0.3 01/18/2019   ALKPHOS 102 01/18/2019   AST 18 01/18/2019   ALT 20 01/18/2019   ANIONGAP 9 04/13/2017   Last lipids Lab Results  Component Value Date   CHOL 150 01/18/2019   HDL 62 01/18/2019   LDLCALC 76 01/18/2019   TRIG 60 01/18/2019   CHOLHDL 2.4 01/18/2019   Last hemoglobin A1c Lab Results  Component Value Date   HGBA1C 5.2 01/18/2019   Last thyroid functions Lab Results  Component Value Date   TSH 3.180 01/18/2019   Last vitamin D Lab Results  Component Value Date   VD25OH 30.1 01/19/2018      Objective    BP 122/70 (BP Location: Left Arm, Patient Position: Sitting, Cuff Size: Large)   Pulse 84   Temp (!) 97.1 F (36.2 C) (Temporal)   Resp 16   Ht 5'  3" (1.6 m)   Wt (!) 313 lb (142 kg)   LMP 01/03/2020 (Exact Date)   SpO2 98%   BMI 55.45 kg/m  BP Readings from Last 3 Encounters:  02/05/20 122/70  01/18/19 135/90  01/14/18 120/70   Wt Readings from Last 3 Encounters:  02/05/20 (!) 313 lb (142 kg)  04/06/19 (!) 321 lb 6.4 oz (145.8 kg)  03/23/19 (!) 325 lb 4.8 oz (147.6 kg)      Physical Exam Vitals reviewed.  Constitutional:      General: She is not in acute distress.    Appearance: Normal appearance. She is well-developed. She is obese. She is not ill-appearing or diaphoretic.  HENT:     Head: Normocephalic and atraumatic.     Right Ear: Tympanic membrane, ear canal and external ear normal.     Left Ear: Tympanic membrane, ear canal and external ear normal.     Nose: Nose normal.     Mouth/Throat:     Mouth: Mucous membranes are moist.     Pharynx: No oropharyngeal exudate or posterior oropharyngeal erythema.  Eyes:     General: No scleral icterus.       Right eye: No discharge.        Left eye: No discharge.     Extraocular Movements: Extraocular movements intact.     Conjunctiva/sclera: Conjunctivae normal.     Pupils: Pupils are equal, round, and reactive to light.  Neck:     Thyroid: No thyromegaly.     Vascular: No JVD.     Trachea: No tracheal deviation.  Cardiovascular:     Rate and Rhythm: Normal rate and regular rhythm.     Pulses: Normal pulses.     Heart sounds: Normal heart sounds. No murmur heard.  No friction rub. No gallop.   Pulmonary:     Effort: Pulmonary effort is normal. No respiratory distress.  Breath sounds: Normal breath sounds. No wheezing or rales.  Chest:     Chest wall: No tenderness.     Breasts:        Right: Normal. No swelling, inverted nipple, mass, nipple discharge, skin change or tenderness.        Left: Normal. No swelling, inverted nipple, mass, nipple discharge, skin change or tenderness.  Abdominal:     General: Abdomen is protuberant. Bowel sounds are normal. There  is no distension.     Palpations: Abdomen is soft. There is no mass.     Tenderness: There is no abdominal tenderness. There is no guarding or rebound.     Hernia: There is no hernia in the left inguinal area or right inguinal area.  Genitourinary:    General: Normal vulva.     Exam position: Supine.     Pubic Area: No rash.      Labia:        Right: No rash, tenderness, lesion or injury.        Left: No rash, tenderness, lesion or injury.      Vagina: No vaginal discharge.    Musculoskeletal:        General: No tenderness. Normal range of motion.     Cervical back: Normal range of motion and neck supple.  Lymphadenopathy:     Cervical: No cervical adenopathy.     Upper Body:     Right upper body: No supraclavicular, axillary or pectoral adenopathy.     Left upper body: No supraclavicular, axillary or pectoral adenopathy.     Lower Body: No right inguinal adenopathy. No left inguinal adenopathy.  Skin:    General: Skin is warm and dry.     Capillary Refill: Capillary refill takes less than 2 seconds.     Findings: No rash.  Neurological:     General: No focal deficit present.     Mental Status: She is alert and oriented to person, place, and time. Mental status is at baseline.  Psychiatric:        Mood and Affect: Mood normal.        Behavior: Behavior normal.        Thought Content: Thought content normal.        Judgment: Judgment normal.      Last depression screening scores PHQ 2/9 Scores 02/05/2020 03/13/2019 01/18/2019  PHQ - 2 Score 0 0 1  PHQ- 9 Score 0 - 1   Last fall risk screening Fall Risk  02/05/2020  Falls in the past year? 0  Number falls in past yr: 0  Injury with Fall? 0  Risk for fall due to : No Fall Risks  Follow up Falls evaluation completed   Last Audit-C alcohol use screening Alcohol Use Disorder Test (AUDIT) 02/05/2020  1. How often do you have a drink containing alcohol? 1  2. How many drinks containing alcohol do you have on a typical day when  you are drinking? 0  3. How often do you have six or more drinks on one occasion? 0  AUDIT-C Score 1  Alcohol Brief Interventions/Follow-up AUDIT Score <7 follow-up not indicated   A score of 3 or more in women, and 4 or more in men indicates increased risk for alcohol abuse, EXCEPT if all of the points are from question 1   No results found for any visits on 02/05/20.  Assessment & Plan    Routine Health Maintenance and Physical Exam  Exercise Activities and Dietary  recommendations Goals   None     Immunization History  Administered Date(s) Administered  . Influenza-Unspecified 05/20/2017  . Moderna SARS-COVID-2 Vaccination 08/07/2019, 09/15/2019  . Td 01/14/2018  . Tdap 10/21/2006    Health Maintenance  Topic Date Due  . Hepatitis C Screening  Never done  . INFLUENZA VACCINE  03/10/2020  . PAP SMEAR-Modifier  01/18/2024  . TETANUS/TDAP  01/15/2028  . COVID-19 Vaccine  Completed  . HIV Screening  Completed    Discussed health benefits of physical activity, and encouraged her to engage in regular exercise appropriate for her age and condition.  1. Annual physical exam Normal physical exam today. Will check labs as below and f/u pending lab results. If labs are stable and WNL she will not need to have these rechecked for one year at her next annual physical exam. She is to call the office in the meantime if she has any acute issue, questions or concerns.  2. Adult hypothyroidism Stable. Diagnosis pulled for medication refill. Continue current medical treatment plan. Will check labs as below and f/u pending results. - Comprehensive metabolic panel - TSH - levothyroxine (SYNTHROID) 50 MCG tablet; Take 1 tablet (50 mcg total) by mouth daily before breakfast.  Dispense: 90 tablet; Refill: 3  3. Borderline diabetes Diet controlled. Will check labs as below and f/u pending results. - Comprehensive metabolic panel - Hemoglobin A1c - Lipid Panel With LDL/HDL Ratio - CBC  w/Diff/Platelet  4. Recurrent major depressive disorder, in full remission ([place]) Stable. Diagnosis pulled for medication refill. Continue current medical treatment plan. Will check labs as below and f/u pending results. - Comprehensive metabolic panel - escitalopram (LEXAPRO) 20 MG tablet; Take 1 tablet (20 mg total) by mouth daily.  Dispense: 90 tablet; Refill: 3  5. Anxiety Stable. Diagnosis pulled for medication refill. Continue current medical treatment plan. Will check labs as below and f/u pending results. - Comprehensive metabolic panel - ALPRAZolam (XANAX) 0.5 MG tablet; TAKE 1 TABLET BY MOUTH AT BEDTIME AS NEEDED FOR ANXIETY.  Dispense: 90 tablet; Refill: 1  6. Arthritis with psoriasis ([place]) Stable. Followed by Rheumatology. - Comprehensive metabolic panel  7. Avitaminosis D H/O this. Will check labs as below and f/u pending results. - VITAMIN D 25 Hydroxy (Vit-D Deficiency, Fractures) - CBC w/Diff/Platelet  8. Morbidly obese ([place]) Counseled patient on healthy lifestyle modifications including dieting and exercise. She is exercising, does boot camp and follows nutrition plan. - Comprehensive metabolic panel - Hemoglobin A1c - Lipid Panel With LDL/HDL Ratio - CBC w/Diff/Platelet  9. Class 3 severe obesity due to excess calories with serious comorbidity and body mass index (BMI) of 50.0 to 59.9 in adult [facility]) See above medical treatment plan. Will check labs as below and f/u pending results. - Comprehensive metabolic panel - Hemoglobin A1c - Lipid Panel With LDL/HDL Ratio - CBC w/Diff/Platelet  10. Encounter for hepatitis C screening test for low risk patient Will check labs as below and f/u pending results. - Hepatitis C antibody  11. Encounter for breast cancer screening using non-mammogram modality Breast exam today was normal. There is no family history of breast cancer. She does perform regular self breast exams. Mammogram was ordered as below. Information for  [facility] clinic was given to patient so she may schedule her mammogram at her convenience. - MM 3D SCREEN BREAST BILATERAL  12. Intertrigo Worsening again. Was treated earlier this month and started improving, but never resolved. Will continue treatment as below. Advised to not scrub to  wash, just let water run over. Avoid hot water. Keep area clean and dry.  - clotrimazole-betamethasone (LOTRISONE) cream; Apply 1 application topically 2 (two) times daily.  Dispense: 30 g; Refill: 0 - ketoconazole (NIZORAL) 200 MG tablet; Take 1 tablet (200 mg total) by mouth daily.  Dispense: 30 tablet; Refill: 0  13. Irritable bowel syndrome with diarrhea Worsening. Will try Bentyl as below. If not improving in 4 weeks please call and will change treatment.  - dicyclomine (BENTYL) 10 MG capsule; Take 1 capsule (10 mg total) by mouth 4 (four) times daily -  before meals and at bedtime.  Dispense: 120 capsule; Refill: 5  No follow-ups on file.     Reynolds Bowl, PA-C, have reviewed all documentation for this visit. The documentation on 02/06/20 for the exam, diagnosis, procedures, and orders are all accurate and complete.   Rubye Beach  [facility] [phone] (phone) [phone] (fax)  [place]

## 2020-02-06 ENCOUNTER — Encounter: Payer: Self-pay | Admitting: Physician Assistant

## 2020-02-08 DIAGNOSIS — Z6841 Body Mass Index (BMI) 40.0 and over, adult: Secondary | ICD-10-CM | POA: Diagnosis not present

## 2020-02-08 DIAGNOSIS — R7303 Prediabetes: Secondary | ICD-10-CM | POA: Diagnosis not present

## 2020-02-08 DIAGNOSIS — F419 Anxiety disorder, unspecified: Secondary | ICD-10-CM | POA: Diagnosis not present

## 2020-02-08 DIAGNOSIS — F3342 Major depressive disorder, recurrent, in full remission: Secondary | ICD-10-CM | POA: Diagnosis not present

## 2020-02-08 DIAGNOSIS — E559 Vitamin D deficiency, unspecified: Secondary | ICD-10-CM | POA: Diagnosis not present

## 2020-02-08 DIAGNOSIS — E039 Hypothyroidism, unspecified: Secondary | ICD-10-CM | POA: Diagnosis not present

## 2020-02-08 DIAGNOSIS — L4050 Arthropathic psoriasis, unspecified: Secondary | ICD-10-CM | POA: Diagnosis not present

## 2020-02-08 DIAGNOSIS — Z1159 Encounter for screening for other viral diseases: Secondary | ICD-10-CM | POA: Diagnosis not present

## 2020-02-09 ENCOUNTER — Telehealth: Payer: Self-pay

## 2020-02-09 LAB — CBC WITH DIFFERENTIAL/PLATELET
Basophils Absolute: 0.1 10*3/uL (ref 0.0–0.2)
Basos: 1 %
EOS (ABSOLUTE): 0.1 10*3/uL (ref 0.0–0.4)
Eos: 1 %
Hematocrit: 40.8 % (ref 34.0–46.6)
Hemoglobin: 13.7 g/dL (ref 11.1–15.9)
Immature Grans (Abs): 0.1 10*3/uL (ref 0.0–0.1)
Immature Granulocytes: 1 %
Lymphocytes Absolute: 2.3 10*3/uL (ref 0.7–3.1)
Lymphs: 22 %
MCH: 28.7 pg (ref 26.6–33.0)
MCHC: 33.6 g/dL (ref 31.5–35.7)
MCV: 85 fL (ref 79–97)
Monocytes Absolute: 0.4 10*3/uL (ref 0.1–0.9)
Monocytes: 4 %
Neutrophils Absolute: 7.6 10*3/uL — ABNORMAL HIGH (ref 1.4–7.0)
Neutrophils: 71 %
Platelets: 357 10*3/uL (ref 150–450)
RBC: 4.78 x10E6/uL (ref 3.77–5.28)
RDW: 13.2 % (ref 11.7–15.4)
WBC: 10.6 10*3/uL (ref 3.4–10.8)

## 2020-02-09 LAB — COMPREHENSIVE METABOLIC PANEL
ALT: 11 IU/L (ref 0–32)
AST: 16 IU/L (ref 0–40)
Albumin/Globulin Ratio: 1.5 (ref 1.2–2.2)
Albumin: 4 g/dL (ref 3.8–4.8)
Alkaline Phosphatase: 89 IU/L (ref 48–121)
BUN/Creatinine Ratio: 21 (ref 9–23)
BUN: 13 mg/dL (ref 6–24)
Bilirubin Total: 0.3 mg/dL (ref 0.0–1.2)
CO2: 22 mmol/L (ref 20–29)
Calcium: 8.9 mg/dL (ref 8.7–10.2)
Chloride: 106 mmol/L (ref 96–106)
Creatinine, Ser: 0.62 mg/dL (ref 0.57–1.00)
GFR calc Af Amer: 129 mL/min/{1.73_m2} (ref >59)
GFR calc non Af Amer: 112 mL/min/{1.73_m2} (ref >59)
Globulin, Total: 2.6 g/dL (ref 1.5–4.5)
Glucose: 119 mg/dL — ABNORMAL HIGH (ref 65–99)
Potassium: 4.5 mmol/L (ref 3.5–5.2)
Sodium: 143 mmol/L (ref 134–144)
Total Protein: 6.6 g/dL (ref 6.0–8.5)

## 2020-02-09 LAB — LIPID PANEL WITH LDL/HDL RATIO
Cholesterol, Total: 144 mg/dL (ref 100–199)
HDL: 56 mg/dL (ref >39)
LDL Chol Calc (NIH): 74 mg/dL (ref 0–99)
LDL/HDL Ratio: 1.3 ratio (ref 0.0–3.2)
Triglycerides: 70 mg/dL (ref 0–149)
VLDL Cholesterol Cal: 14 mg/dL (ref 5–40)

## 2020-02-09 LAB — HEPATITIS C ANTIBODY: Hep C Virus Ab: <0.1 s/co ratio (ref 0.0–0.9)

## 2020-02-09 LAB — HEMOGLOBIN A1C
Est. average glucose Bld gHb Est-mCnc: 117 mg/dL
Hgb A1c MFr Bld: 5.7 % — ABNORMAL HIGH (ref 4.8–5.6)

## 2020-02-09 LAB — VITAMIN D 25 HYDROXY (VIT D DEFICIENCY, FRACTURES): Vit D, 25-Hydroxy: 33.8 ng/mL (ref 30.0–100.0)

## 2020-02-09 LAB — TSH: TSH: 3.46 u[IU]/mL (ref 0.450–4.500)

## 2020-02-09 NOTE — Telephone Encounter (Signed)
Kidney and liver function are normal. Sodium, potassium, and calcium are normal. A1c/Sugar has increased compared to last year and is back up to 5.7 which is borderline prediabetic. Hep C screen is negative. Cholesterol is normal. Thyroid is normal. Vit D is normal. Blood count is normal.  Written by Mar Daring, PA-C on 02/09/2020 8:19 AM EDT Seen by patient Kelsey Randolph on 02/09/2020 8:21 AM

## 2020-02-09 NOTE — Telephone Encounter (Signed)
-----   Message from Mar Daring, [place] sent at 02/09/2020  8:19 AM EDT ----- Kidney and liver function are normal. Sodium, potassium, and calcium are normal. A1c/Sugar has increased compared to last year and is back up to 5.7 which is borderline prediabetic. Hep C screen is negative. Cholesterol is normal. Thyroid is  normal. Vit D is normal. Blood count is normal.

## 2020-02-21 MED FILL — COSENTYX 300 MG DOSE-2 PENS: 150 | 28 days supply | Qty: 2 | Fill #2

## 2020-03-21 MED FILL — COSENTYX 300 MG DOSE-2 PENS: 150 | 28 days supply | Qty: 2 | Fill #3

## 2020-04-18 MED FILL — COSENTYX 300 MG DOSE-2 PENS: 150 | 28 days supply | Qty: 2 | Fill #4

## 2020-04-22 DIAGNOSIS — M62838 Other muscle spasm: Secondary | ICD-10-CM | POA: Diagnosis not present

## 2020-04-22 DIAGNOSIS — M25512 Pain in left shoulder: Secondary | ICD-10-CM | POA: Diagnosis not present

## 2020-04-30 DIAGNOSIS — L4050 Arthropathic psoriasis, unspecified: Secondary | ICD-10-CM | POA: Diagnosis not present

## 2020-04-30 DIAGNOSIS — M79675 Pain in left toe(s): Secondary | ICD-10-CM | POA: Diagnosis not present

## 2020-04-30 DIAGNOSIS — Z79899 Other long term (current) drug therapy: Secondary | ICD-10-CM | POA: Diagnosis not present

## 2020-05-07 DIAGNOSIS — M722 Plantar fascial fibromatosis: Secondary | ICD-10-CM | POA: Diagnosis not present

## 2020-05-16 MED FILL — COSENTYX 300 MG DOSE-2 PENS: 150 | 28 days supply | Qty: 2 | Fill #5

## 2020-05-29 ENCOUNTER — Other Ambulatory Visit: Payer: Self-pay

## 2020-05-29 ENCOUNTER — Ambulatory Visit
Admission: RE | Admit: 2020-05-29 | Discharge: 2020-05-29 | Disposition: A | Discharge status: Home / Self Care | Payer: 59 | Source: Ambulatory Visit | Attending: Physician Assistant | Admitting: Physician Assistant

## 2020-05-29 DIAGNOSIS — Z1231 Encounter for screening mammogram for malignant neoplasm of breast: Secondary | ICD-10-CM | POA: Diagnosis not present

## 2020-06-10 DIAGNOSIS — M9906 Segmental and somatic dysfunction of lower extremity: Secondary | ICD-10-CM | POA: Diagnosis not present

## 2020-06-10 DIAGNOSIS — M62461 Contracture of muscle, right lower leg: Secondary | ICD-10-CM | POA: Diagnosis not present

## 2020-06-10 DIAGNOSIS — M62462 Contracture of muscle, left lower leg: Secondary | ICD-10-CM | POA: Diagnosis not present

## 2020-06-12 DIAGNOSIS — M9906 Segmental and somatic dysfunction of lower extremity: Secondary | ICD-10-CM | POA: Diagnosis not present

## 2020-06-12 DIAGNOSIS — M62461 Contracture of muscle, right lower leg: Secondary | ICD-10-CM | POA: Diagnosis not present

## 2020-06-12 DIAGNOSIS — M62462 Contracture of muscle, left lower leg: Secondary | ICD-10-CM | POA: Diagnosis not present

## 2020-06-12 MED FILL — COSENTYX 300 MG DOSE-2 PENS: 150 | 28 days supply | Qty: 2 | Fill #6

## 2020-06-13 ENCOUNTER — Other Ambulatory Visit: Payer: Self-pay | Admitting: Podiatry

## 2020-06-17 DIAGNOSIS — M62462 Contracture of muscle, left lower leg: Secondary | ICD-10-CM | POA: Diagnosis not present

## 2020-06-17 DIAGNOSIS — M9906 Segmental and somatic dysfunction of lower extremity: Secondary | ICD-10-CM | POA: Diagnosis not present

## 2020-06-17 DIAGNOSIS — M62461 Contracture of muscle, right lower leg: Secondary | ICD-10-CM | POA: Diagnosis not present

## 2020-06-26 DIAGNOSIS — H5213 Myopia, bilateral: Secondary | ICD-10-CM | POA: Diagnosis not present

## 2020-07-03 DIAGNOSIS — M25512 Pain in left shoulder: Secondary | ICD-10-CM | POA: Diagnosis not present

## 2020-07-03 DIAGNOSIS — M62838 Other muscle spasm: Secondary | ICD-10-CM | POA: Diagnosis not present

## 2020-07-11 MED FILL — COSENTYX 300 MG DOSE-2 PENS: 150 | 28 days supply | Qty: 2 | Fill #7

## 2020-07-22 ENCOUNTER — Other Ambulatory Visit: Payer: Self-pay | Admitting: Rheumatology

## 2020-07-22 DIAGNOSIS — M25572 Pain in left ankle and joints of left foot: Secondary | ICD-10-CM | POA: Diagnosis not present

## 2020-07-22 DIAGNOSIS — L4050 Arthropathic psoriasis, unspecified: Secondary | ICD-10-CM | POA: Diagnosis not present

## 2020-07-22 DIAGNOSIS — G8929 Other chronic pain: Secondary | ICD-10-CM | POA: Diagnosis not present

## 2020-07-29 ENCOUNTER — Ambulatory Visit: Payer: 59 | Admitting: Dermatology

## 2020-07-29 ENCOUNTER — Other Ambulatory Visit: Payer: Self-pay

## 2020-07-29 DIAGNOSIS — Z1283 Encounter for screening for malignant neoplasm of skin: Secondary | ICD-10-CM

## 2020-07-29 DIAGNOSIS — L719 Rosacea, unspecified: Secondary | ICD-10-CM

## 2020-07-29 DIAGNOSIS — D489 Neoplasm of uncertain behavior, unspecified: Secondary | ICD-10-CM

## 2020-07-29 DIAGNOSIS — L578 Other skin changes due to chronic exposure to nonionizing radiation: Secondary | ICD-10-CM

## 2020-07-29 DIAGNOSIS — D229 Melanocytic nevi, unspecified: Secondary | ICD-10-CM | POA: Diagnosis not present

## 2020-07-29 DIAGNOSIS — D2339 Other benign neoplasm of skin of other parts of face: Secondary | ICD-10-CM

## 2020-07-29 DIAGNOSIS — Z86018 Personal history of other benign neoplasm: Secondary | ICD-10-CM

## 2020-07-29 DIAGNOSIS — L821 Other seborrheic keratosis: Secondary | ICD-10-CM | POA: Diagnosis not present

## 2020-07-29 DIAGNOSIS — L820 Inflamed seborrheic keratosis: Secondary | ICD-10-CM | POA: Diagnosis not present

## 2020-07-29 DIAGNOSIS — L304 Erythema intertrigo: Secondary | ICD-10-CM

## 2020-07-29 DIAGNOSIS — L918 Other hypertrophic disorders of the skin: Secondary | ICD-10-CM | POA: Diagnosis not present

## 2020-07-29 DIAGNOSIS — D485 Neoplasm of uncertain behavior of skin: Secondary | ICD-10-CM | POA: Diagnosis not present

## 2020-07-29 DIAGNOSIS — D235 Other benign neoplasm of skin of trunk: Secondary | ICD-10-CM | POA: Diagnosis not present

## 2020-07-29 DIAGNOSIS — D1800 Hemangioma unspecified site: Secondary | ICD-10-CM

## 2020-07-29 DIAGNOSIS — L814 Other melanin hyperpigmentation: Secondary | ICD-10-CM | POA: Diagnosis not present

## 2020-07-29 NOTE — Progress Notes (Signed)
Follow-Up Visit   Subjective  Kelsey Randolph is a 44 y.o. female who presents for the following: TBSE (Patient here today for TBSE. She has a history of dysplastic nevus and margins were ruled clear on right upper arm. Patient noticed a couple a months ago an area between breast. She states sometimes itchy. Patient also has a place on nose it has been there for years and doesn't bother patient. ). The patient presents for Total-Body Skin Exam (TBSE) for skin cancer screening and mole check.  The following portions of the chart were reviewed this encounter and updated as appropriate:  Tobacco  Allergies  Meds  Problems  Med Hx  Surg Hx  Fam Hx      Objective  Well appearing patient in no apparent distress; mood and affect are within normal limits.  A full examination was performed including scalp, head, eyes, ears, nose, lips, neck, chest, axillae, abdomen, back, buttocks, bilateral upper extremities, bilateral lower extremities, hands, feet, fingers, toes, fingernails, and toenails. All findings within normal limits unless otherwise noted below.  Objective  face: Telangiectasia   Objective  left chest parasternal superior: 0.6 cm pink papule   Irritated nevus r/o dysplasia   Objective  Left chest parasternal inferior: 0.4 cm pink papule   Irritated nevus r/o dysplasia   Objective  Left inferior scapula: 0.6 cm brown papule  Nevus vs dysplastic nevus   Objective  right nasal rim: 2 mm flesh colored papule   Objective  Superior Buttock Crease: Intertrigo is a chronic recurrent rash that occurs in skin fold areas that may be associated with friction; heat; moisture; yeast; fungus; and bacteria.  It is exacerbated by increased movement / activity; sweating; and higher atmospheric temperature.   Assessment & Plan    Lentigines - Scattered tan macules - Discussed due to sun exposure - Benign, observe - Call for any changes  Seborrheic Keratoses - Stuck-on,  waxy, tan-brown papules and plaques  - Discussed benign etiology and prognosis. - Observe - Call for any changes  Melanocytic Nevi - Tan-brown and/or pink-flesh-colored symmetric macules and papules - Benign appearing on exam today - Observation - Call clinic for new or changing moles - Recommend daily use of broad spectrum spf 30+ sunscreen to sun-exposed areas.   Hemangiomas - Red papules - Discussed benign nature - Observe - Call for any changes  Actinic Damage - Chronic, secondary to cumulative UV/sun exposure - diffuse scaly erythematous macules with underlying dyspigmentation - Recommend daily broad spectrum sunscreen SPF 30+ to sun-exposed areas, reapply every 2 hours as needed.  - Call for new or changing lesions.  Skin cancer screening performed today.  History of Dysplastic Nevi - No evidence of recurrence today - Recommend regular full body skin exams - Recommend daily broad spectrum sunscreen SPF 30+ to sun-exposed areas, reapply every 2 hours as needed.  - Call if any new or changing lesions are noted between office visits  Rosacea face Erythrotelangiectatic Discussed BBL laser   Neoplasm of uncertain behavior (3) left chest parasternal superior Epidermal / dermal shaving  Lesion diameter (cm):  0.6 Informed consent: discussed and consent obtained   Timeout: patient name, date of birth, surgical site, and procedure verified   Procedure prep:  Patient was prepped and draped in usual sterile fashion Prep type:  Isopropyl alcohol Anesthesia: the lesion was anesthetized in a standard fashion   Anesthetic:  1% lidocaine w/ epinephrine 1-100,000 buffered w/ 8.4% NaHCO3 Instrument used: flexible razor blade  Hemostasis achieved with: pressure, aluminum chloride and electrodesiccation   Outcome: patient tolerated procedure well   Post-procedure details: sterile dressing applied and wound care instructions given   Dressing type: bandage and petrolatum     Specimen 1 - Surgical pathology Differential Diagnosis: Irritated nevus r/o dysplasia  Check Margins: No 0.6 cm pink papule    Left chest parasternal inferior Epidermal / dermal shaving  Lesion diameter (cm):  0.4 Informed consent: discussed and consent obtained   Timeout: patient name, date of birth, surgical site, and procedure verified   Procedure prep:  Patient was prepped and draped in usual sterile fashion Prep type:  Isopropyl alcohol Anesthesia: the lesion was anesthetized in a standard fashion   Anesthetic:  1% lidocaine w/ epinephrine 1-100,000 buffered w/ 8.4% NaHCO3 Instrument used: flexible razor blade   Hemostasis achieved with: pressure, aluminum chloride and electrodesiccation   Outcome: patient tolerated procedure well   Post-procedure details: sterile dressing applied and wound care instructions given   Dressing type: bandage and petrolatum    Specimen 2 - Surgical pathology Differential Diagnosis: Irritated nevus r/o dysplasia  Check Margins: No 0.4 cm pink papule    Left inferior scapula Epidermal / dermal shaving  Lesion diameter (cm):  0.6 Informed consent: discussed and consent obtained   Timeout: patient name, date of birth, surgical site, and procedure verified   Procedure prep:  Patient was prepped and draped in usual sterile fashion Prep type:  Isopropyl alcohol Anesthesia: the lesion was anesthetized in a standard fashion   Anesthetic:  1% lidocaine w/ epinephrine 1-100,000 buffered w/ 8.4% NaHCO3 Instrument used: flexible razor blade   Hemostasis achieved with: pressure, aluminum chloride and electrodesiccation   Outcome: patient tolerated procedure well   Post-procedure details: sterile dressing applied and wound care instructions given   Dressing type: bandage and petrolatum    Specimen 3 - Surgical pathology Differential Diagnosis: Nevus vs dysplastic nevus  Check Margins: No 0.6 cm brown papule  Fibrous papule of skin right  nasal rim Benign-appearing.  Observation.  Call clinic for new or changing lesions.  Recommend daily use of broad spectrum spf 30+ sunscreen to sun-exposed areas.   Erythema intertrigo Superior Buttock Crease Start Skin medicinals intertrigo daily as needed.  Recommend zeasorb AF powder for maintenance   Intertrigo is a chronic recurrent rash that occurs in skin fold areas that may be associated with friction; heat; moisture; yeast; fungus; and bacteria.  It is exacerbated by increased movement / activity; sweating; and higher atmospheric temperature.  Return in about 1 year (around 07/29/2021) for TBSE .  IRuthell Rummage, CMA, am acting as scribe for Sarina Ser, MD.  Documentation: I have reviewed the above documentation for accuracy and completeness, and I agree with the above.  Sarina Ser, MD

## 2020-07-29 NOTE — Patient Instructions (Addendum)
Melanoma ABCDEs  Melanoma is the most dangerous type of skin cancer, and is the leading cause of death from skin disease.  You are more likely to develop melanoma if you:  Have light-colored skin, light-colored eyes, or red or blond hair  Spend a lot of time in the sun  Tan regularly, either outdoors or in a tanning bed  Have had blistering sunburns, especially during childhood  Have a close family member who has had a melanoma  Have atypical moles or large birthmarks  Early detection of melanoma is key since treatment is typically straightforward and cure rates are extremely high if we catch it early.   The first sign of melanoma is often a change in a mole or a new dark spot.  The ABCDE system is a way of remembering the signs of melanoma.  A for asymmetry:  The two halves do not match. B for border:  The edges of the growth are irregular. C for color:  A mixture of colors are present instead of an even brown color. D for diameter:  Melanomas are usually (but not always) greater than 41mm - the size of a pencil eraser. E for evolution:  The spot keeps changing in size, shape, and color.  Please check your skin once per month between visits. You can use a small mirror in front and a large mirror behind you to keep an eye on the back side or your body.   If you see any new or changing lesions before your next follow-up, please call to schedule a visit.  Please continue daily skin protection including broad spectrum sunscreen SPF 30+ to sun-exposed areas, reapplying every 2 hours as needed when you're outdoors.  Wound Care Instructions  1. Cleanse wound gently with soap and water once a day then pat dry with clean gauze. Apply a thing coat of Petrolatum (petroleum jelly, "Vaseline") over the wound (unless you have an allergy to this). We recommend that you use a new, sterile tube of Vaseline. Do not pick or remove scabs. Do not remove the yellow or white "healing tissue" from the base  of the wound.  2. Cover the wound with fresh, clean, nonstick gauze and secure with paper tape. You may use Band-Aids in place of gauze and tape if the would is small enough, but would recommend trimming much of the tape off as there is often too much. Sometimes Band-Aids can irritate the skin.  3. You should call the office for your biopsy report after 1 week if you have not already been contacted.  4. If you experience any problems, such as abnormal amounts of bleeding, swelling, significant bruising, significant pain, or evidence of infection, please call the office immediately.  5. FOR ADULT SURGERY PATIENTS: If you need something for pain relief you may take 1 extra strength Tylenol (acetaminophen) AND 2 Ibuprofen (200mg  each) together every 4 hours as needed for pain. (do not take these if you are allergic to them or if you have a reason you should not take them.) Typically, you may only need pain medication for 1 to 3 days.    Instructions for [facility] Medications  One or more of your medications was sent to the [facility] mail order compounding pharmacy. You will receive an email from them and can purchase the medicine through that link. It will then be mailed to your home at the address you confirmed. If for any reason you do not receive an email from them, please  check your spam folder. If you still do not find the email, please let us know. [facility] phone number is [phone].  Recommend zeasorb AF powder for maintenance.

## 2020-07-30 ENCOUNTER — Ambulatory Visit (HOSPITAL_BASED_OUTPATIENT_CLINIC_OR_DEPARTMENT_OTHER): Payer: 59 | Admitting: Pharmacist

## 2020-07-30 ENCOUNTER — Telehealth: Payer: Self-pay | Admitting: Pharmacist

## 2020-07-30 DIAGNOSIS — Z79899 Other long term (current) drug therapy: Secondary | ICD-10-CM

## 2020-07-30 NOTE — Telephone Encounter (Signed)
Called patient to schedule an appointment for the  [facility]. I was unable to reach the patient so I left a HIPAA-compliant message requesting that the patient return my call.   

## 2020-07-30 NOTE — Progress Notes (Signed)
S: Patient presents today for review of their specialty medication.   Patient is currently taking Cosentyx for psoriatic arthritis. Patient is managed by Dr. Jefm Bryant for this.   Adherence: confirms  Efficacy: continued to be very pleased with results  Current adverse effects: S/sx of infection: denies GI upset: denies Headache: denies S/sx of hypersensitivity: denies  O:   Lab Results  Component Value Date   WBC 10.6 02/08/2020   HGB 13.7 02/08/2020   HCT 40.8 02/08/2020   MCV 85 02/08/2020   PLT 357 02/08/2020      Chemistry      Component Value Date/Time   NA 143 02/08/2020 0906   K 4.5 02/08/2020 0906   CL 106 02/08/2020 0906   CO2 22 02/08/2020 0906   BUN 13 02/08/2020 0906   CREATININE 0.62 02/08/2020 0906      Component Value Date/Time   CALCIUM 8.9 02/08/2020 0906   ALKPHOS 89 02/08/2020 0906   AST 16 02/08/2020 0906   ALT 11 02/08/2020 0906   BILITOT 0.3 02/08/2020 0906      A/P: 1. Medication review: Patient is currently on Cosentyx for psoriatic arthritis. Reviewed the medication with the patient, including the following: Cosentyx is a monoclonal antibody used in the treatment of ankylosing spondylitis, psoriasis, and psoriatic arthritis. The injection is subq and the medication should be allowed to reach room temp prior to injecting. Injection sites should be rotated. Possible adverse effects include headaches, GI upset, increased risk of infection and hypersensitivity reactions. No recommendations for any changes at this time.   Benard Halsted, PharmD, [facility] [place], [place] [phone]

## 2020-08-05 DIAGNOSIS — M170 Bilateral primary osteoarthritis of knee: Secondary | ICD-10-CM | POA: Diagnosis not present

## 2020-08-05 DIAGNOSIS — M25461 Effusion, right knee: Secondary | ICD-10-CM | POA: Diagnosis not present

## 2020-08-05 DIAGNOSIS — M25462 Effusion, left knee: Secondary | ICD-10-CM | POA: Diagnosis not present

## 2020-08-05 DIAGNOSIS — M25561 Pain in right knee: Secondary | ICD-10-CM | POA: Diagnosis not present

## 2020-08-05 DIAGNOSIS — M25562 Pain in left knee: Secondary | ICD-10-CM | POA: Diagnosis not present

## 2020-08-06 ENCOUNTER — Other Ambulatory Visit: Payer: Self-pay | Admitting: Physician Assistant

## 2020-08-06 ENCOUNTER — Telehealth: Payer: Self-pay

## 2020-08-06 DIAGNOSIS — L304 Erythema intertrigo: Secondary | ICD-10-CM

## 2020-08-06 MED ORDER — CLOTRIMAZOLE-BETAMETHASONE 1-0.05 % EX CREA: 1.0000 "application " | TOPICAL_CREAM | Freq: Two times a day (BID) | CUTANEOUS | 0 refills | Status: DC

## 2020-08-06 MED ORDER — KETOCONAZOLE 200 MG PO TABS: 200.0000 mg | ORAL_TABLET | Freq: Every day | ORAL | 0 refills | Status: DC

## 2020-08-06 NOTE — Telephone Encounter (Signed)
Unable to leave a message.

## 2020-08-06 NOTE — Telephone Encounter (Signed)
-----   Message from David C Kowalski, MD sent at 08/05/2020  3:06 PM EST ----- Diagnosis 1. Skin , left chest parasternal superior SEBORRHEIC KERATOSIS, INFLAMED 2. 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 Skin , left inferior scapula PIGMENTED SEBORRHEIC KERATOSIS, IRRITATED  1&3 - benign keratosis No further treatment needed 2- benign inflamed skin tag with viral wart May recur No further treatment unless recurs 

## 2020-08-07 ENCOUNTER — Encounter: Payer: Self-pay | Admitting: Dermatology

## 2020-08-07 ENCOUNTER — Other Ambulatory Visit: Payer: Self-pay | Admitting: Pharmacist

## 2020-08-07 MED ORDER — COSENTYX SENSOREADY (300 MG) 150 MG/ML ~~LOC~~ SOAJ: SUBCUTANEOUS | 7 refills | Status: DC

## 2020-08-07 MED FILL — COSENTYX 300 MG DOSE-2 PENS: 150 | 28 days supply | Qty: 2 | Fill #0

## 2020-08-19 ENCOUNTER — Telehealth: Payer: Self-pay

## 2020-08-19 NOTE — Telephone Encounter (Signed)
Unable to leave a message.

## 2020-08-19 NOTE — Telephone Encounter (Signed)
-----   Message from Ralene Bathe, MD sent at 08/05/2020  3:06 PM EST ----- Diagnosis 1. Skin , left chest parasternal superior SEBORRHEIC KERATOSIS, INFLAMED 2. 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 Skin , left inferior scapula PIGMENTED SEBORRHEIC KERATOSIS, IRRITATED  1&3 - benign keratosis No further treatment needed 2- benign inflamed skin tag with viral wart May recur No further treatment unless recurs

## 2020-09-03 NOTE — Telephone Encounter (Signed)
Patient advised of biopsy results.

## 2020-09-05 MED FILL — COSENTYX 300 MG DOSE-2 PENS: 150 | 28 days supply | Qty: 2 | Fill #1

## 2020-09-11 ENCOUNTER — Telehealth: Payer: 59 | Admitting: Nurse Practitioner

## 2020-09-11 ENCOUNTER — Other Ambulatory Visit: Payer: Self-pay | Admitting: Nurse Practitioner

## 2020-09-11 DIAGNOSIS — M545 Low back pain, unspecified: Secondary | ICD-10-CM

## 2020-09-11 MED ORDER — NAPROXEN 500 MG PO TABS: 500.0000 mg | ORAL_TABLET | Freq: Two times a day (BID) | ORAL | 0 refills | Status: DC

## 2020-09-11 MED ORDER — CYCLOBENZAPRINE HCL 10 MG PO TABS: 10.0000 mg | ORAL_TABLET | Freq: Three times a day (TID) | ORAL | 0 refills | Status: DC | PRN

## 2020-09-11 NOTE — Progress Notes (Signed)

## 2020-09-12 ENCOUNTER — Other Ambulatory Visit: Payer: Self-pay | Admitting: Rheumatology

## 2020-09-26 DIAGNOSIS — L4050 Arthropathic psoriasis, unspecified: Secondary | ICD-10-CM | POA: Diagnosis not present

## 2020-09-26 DIAGNOSIS — M25512 Pain in left shoulder: Secondary | ICD-10-CM | POA: Diagnosis not present

## 2020-10-03 MED FILL — COSENTYX 300 MG DOSE-2 PENS: 150 | 28 days supply | Qty: 2 | Fill #2

## 2020-10-29 ENCOUNTER — Other Ambulatory Visit: Payer: Self-pay

## 2020-10-30 DIAGNOSIS — M25462 Effusion, left knee: Secondary | ICD-10-CM | POA: Diagnosis not present

## 2020-10-30 DIAGNOSIS — M25562 Pain in left knee: Secondary | ICD-10-CM | POA: Diagnosis not present

## 2020-10-30 DIAGNOSIS — G8929 Other chronic pain: Secondary | ICD-10-CM | POA: Diagnosis not present

## 2020-10-30 DIAGNOSIS — M1712 Unilateral primary osteoarthritis, left knee: Secondary | ICD-10-CM | POA: Diagnosis not present

## 2020-10-30 MED FILL — COSENTYX 300 MG DOSE-2 PENS: 150 | 28 days supply | Qty: 2 | Fill #3

## 2020-11-06 ENCOUNTER — Encounter: Payer: Self-pay | Admitting: Physician Assistant

## 2020-11-06 ENCOUNTER — Ambulatory Visit: Payer: Self-pay

## 2020-11-06 NOTE — Telephone Encounter (Signed)
Pt. States she has started her period a week early and is having heavy, bright red bleeding which not normal foe her. Mild cramping. No clots. Concerned. Appointment made for tomorrow.  Reason for Disposition . [1] Periods with > 6 soaked pads or tampons per day AND [2] last > 7 days  Answer Assessment - Initial Assessment Questions 1. AMOUNT: "Describe the bleeding that you are having."    - SPOTTING: spotting, or pinkish / brownish mucous discharge; does not fill panti-liner or pad    - MILD:  less than 1 pad / hour; less than patient's usual menstrual bleeding   - MODERATE: 1-2 pads / hour; 1 menstrual cup every 6 hours; small-medium blood clots (e.g., pea, grape, small coin)   - SEVERE: soaking 2 or more pads/hour for 2 or more hours; 1 menstrual cup every 2 hours; bleeding not contained by pads or continuous red blood from vagina; large blood clots (e.g., golf ball, large coin)      Tampon every hour 2. ONSET: "When did the bleeding begin?" "Is it continuing now?"     Yesterday 3. MENSTRUAL PERIOD: "When was the last normal menstrual period?" "How is this different than your period?"     Early 4. REGULARITY: "How regular are your periods?"     Very regular 5. ABDOMINAL PAIN: "Do you have any pain?" "How bad is the pain?"  (e.g., Scale 1-10; mild, moderate, or severe)   - MILD (1-3): doesn't interfere with normal activities, abdomen soft and not tender to touch    - MODERATE (4-7): interferes with normal activities or awakens from sleep, tender to touch    - SEVERE (8-10): excruciating pain, doubled over, unable to do any normal activities      Mild 6. PREGNANCY: "Could you be pregnant?" "Are you sexually active?" "Did you recently give birth?"     No 7. BREASTFEEDING: "Are you breastfeeding?"     No 8. HORMONES: "Are you taking any hormone medications, prescription or OTC?" (e.g., birth control pills, estrogen)     No 9. BLOOD THINNERS: "Do you take any blood thinners?" (e.g.,  Coumadin/warfarin, Pradaxa/dabigatran, aspirin)     No 10. CAUSE: "What do you think is causing the bleeding?" (e.g., recent gyn surgery, recent gyn procedure; known bleeding disorder, cervical cancer, polycystic ovarian disease, fibroids)         Unsure 11. HEMODYNAMIC STATUS: "Are you weak or feeling lightheaded?" If Yes, ask: "Can you stand and walk normally?"        No 12. OTHER SYMPTOMS: "What other symptoms are you having with the bleeding?" (e.g., passed tissue, vaginal discharge, fever, menstrual-type cramps)       No  Protocols used: VAGINAL BLEEDING - ABNORMAL-A-AH

## 2020-11-06 NOTE — Telephone Encounter (Signed)
Noted ER if worsening before appointment.

## 2020-11-06 NOTE — Telephone Encounter (Signed)
Fyi. KW 

## 2020-11-07 ENCOUNTER — Ambulatory Visit: Payer: 59 | Admitting: Adult Health

## 2020-11-07 ENCOUNTER — Encounter: Payer: Self-pay | Admitting: Adult Health

## 2020-11-07 ENCOUNTER — Other Ambulatory Visit: Payer: Self-pay

## 2020-11-07 ENCOUNTER — Other Ambulatory Visit (HOSPITAL_COMMUNITY): Payer: Self-pay

## 2020-11-07 VITALS — BP 145/74 | HR 92 | Temp 98.5°F | Resp 16 | Wt 319.5 lb

## 2020-11-07 DIAGNOSIS — N939 Abnormal uterine and vaginal bleeding, unspecified: Secondary | ICD-10-CM | POA: Diagnosis not present

## 2020-11-07 NOTE — Patient Instructions (Signed)
Menorrhagia Menorrhagia is when your monthly periods are heavy or last longer than normal. If you have this condition, bleeding and cramping may make it hard for you to do your daily activities. What are the causes? Common causes of this condition include:  Growths in the womb (uterus). These are polyps or fibroids. These growths are not cancer.  Problems with two hormones called estrogen and progesterone.  One of the ovaries not releasing an egg during one or more months.  A problem with the thyroid gland.  Having a device for birth control (IUD).  Side effects of some medicines, such as NSAIDs or blood thinners.  A disorder that stops the blood from clotting normally. What increases the risk? You are more likely to have this condition if you have cancer of the womb. What are the signs or symptoms?  Having to change your pad or tampon every 1-2 hours because it is soaked.  Needing to use pads and tampons at the same time because of heavy bleeding.  Needing to wake up to change your pads or tampons during the night.  Passing blood clots larger than 1 inch (2.5 cm) in size.  Having bleeding that lasts for more than 7 days.  Having symptoms of low iron levels (anemia), such as feeling tired or having shortness of breath. How is this treated? You may not need to be treated for this condition. But if you need treatment, you may be given medicines:  To reduce bleeding during your period. These include birth control medicines.  To make your blood thick. This slows bleeding.  To reduce swelling. Medicines that do this include ibuprofen.  That have a hormone called progestin.  That make the ovaries stop working for a short time.  To treat low iron levels. You will be given iron pills if you have this condition. If medicines do not work, surgery may be done. Surgery may be done to:  Remove a part of the lining of the womb. This lining is called the endometrium. This reduces  bleeding during a period.  Remove growths in the womb. These may be polyps or fibroids.  Remove the entire lining of the womb.  Remove the womb entirely. This procedure is called a hysterectomy.   Follow these instructions at home: Medicines  Take over-the-counter and prescription medicines only as told by your doctor. This includes iron pills.  Do not change or switch medicines without asking your doctor.  Do not take aspirin or medicines that contain aspirin 1 week before or during your period. Aspirin may make bleeding worse. Managing constipation Iron pills may cause trouble pooping (constipation). To prevent or treat problems when pooping, you may need to:  Drink enough fluid to keep your pee (urine) pale yellow.  Take over-the-counter or prescription medicines.  Eat foods that are high in fiber. These include beans, whole grains, and fresh fruits and vegetables.  Limit foods that are high in fat and sugar. These include fried or sweet foods. General instructions  If you need to change your pad or tampon more than once every 2 hours, limit your activity until the bleeding stops.  Eat healthy meals and foods that are high in iron. Foods that have a lot of iron include: ? Leafy green vegetables. ? Meat. ? Liver. ? Eggs. ? Whole-grain breads and cereals.  Do not try to lose weight until your heavy bleeding has stopped and you have normal amounts of iron in your blood. If you need to lose  weight, work with your doctor.  Keep all follow-up visits. Contact a doctor if:  You soak through a pad or tampon every 1 or 2 hours, and this happens every time you have a period.  You need to use pads and tampons at the same time because you are bleeding so much.  You are taking medicine, and: ? You feel like you may vomit. ? You vomit. ? You have watery poop (diarrhea).  You have other problems that may be related to the medicine you are taking. Get help right away if:  You  soak through more than a pad or tampon in 1 hour.  You pass clots bigger than 1 inch (2.5 cm) wide.  You feel short of breath.  You feel like your heart is beating too fast.  You feel dizzy or you faint.  You feel very weak or tired. Summary  Menorrhagia is when your menstrual periods are heavy or last longer than normal.  You may not need to be treated for this condition. If you need treatment, you may be given medicines or have surgery.  Take over-the-counter and prescription medicines only as told by your doctor. This includes iron pills.  Get help right away if you soak through more than a pad or tampon in 1 hour or you pass large clots. Also, get help right away if you feel dizzy, short of breath, or very weak or tired. This information is not intended to replace advice given to you by your health care provider. Make sure you discuss any questions you have with your health care provider. Document Revised: 04/09/2020 Document Reviewed: 04/09/2020 Elsevier Patient Education  2021 [employer].

## 2020-11-07 NOTE — Progress Notes (Signed)
Established patient visit   Patient: Kelsey Randolph   DOB: 1976-02-17   45 y.o. Female  MRN: [id number] Visit Date: 11/07/2020  Today's healthcare provider: Marcille Buffy, FNP   No chief complaint on file.  Subjective    Vaginal Bleeding The patient's primary symptoms include vaginal bleeding. The patient's pertinent negatives include no genital itching, genital lesions, genital odor, missed menses, pelvic pain or vaginal discharge. This is a new problem. The problem occurs constantly. The problem has been unchanged. The patient is experiencing no pain. She is not pregnant. Associated symptoms include back pain. Pertinent negatives include no abdominal pain, anorexia, chills, constipation, diarrhea, discolored urine, dysuria, fever, flank pain, frequency, headaches, hematuria, joint pain, joint swelling, nausea, painful intercourse, rash, sore throat, urgency or vomiting. Associated symptoms comments: Bloated . The vaginal bleeding is heavier than menses. She has not been passing clots. She has not been passing tissue. She has tried nothing for the symptoms. She is not sexually active. No, her partner does not have an STD.    Denies any chance of pregnancy.  She has noticed heavier bleeding since her covid vaccine last year.   She has decreased the bleeding today. She is changing tampon every hour.  Menses started Tuesday afternoon. Mild cramping. Mild lower back pain.   Denies any clotting. NO abnormal pap smear.  Not on any birth control no IUD.       Medications: Outpatient Medications Prior to Visit  Medication Sig  . acetaminophen (TYLENOL) 500 MG tablet Take 500 mg by mouth every 6 (six) hours as needed.  . ALPRAZolam (XANAX) 0.5 MG tablet TAKE 1 TABLET BY MOUTH AT BEDTIME AS NEEDED FOR ANXIETY.  Marland Kitchen Apple Cid Vn-Grn Tea-Bit Or-Cr (APPLE CIDER VINEGAR PLUS) TABS Take by mouth.  Marland Kitchen b complex vitamins tablet Take 1 tablet by mouth daily.  . cetirizine (ZYRTEC) 10  MG tablet Take 10 mg by mouth daily.   . Cholecalciferol 1000 UNITS capsule Take 1,000 Units by mouth daily.   Marland Kitchen Ketoconazole 2 % GEL Apply 1 application topically daily. Cover the affected and immediate surrounding area for 2 weeks  . levothyroxine (SYNTHROID) 50 MCG tablet Take 1 tablet (50 mcg total) by mouth daily before breakfast.  . sulfaSALAzine (AZULFIDINE) 500 MG tablet Take 500 mg by mouth 2 (two) times daily.  . [DISCONTINUED] clotrimazole-betamethasone (LOTRISONE) cream Apply 1 application topically 2 (two) times daily.  . [DISCONTINUED] escitalopram (LEXAPRO) 20 MG tablet Take 1 tablet (20 mg total) by mouth daily.  . [DISCONTINUED] Secukinumab, 300 MG Dose, (COSENTYX SENSOREADY, 300 MG,) 150 MG/ML SOAJ Inject 300 mg subcutaneously every 28 (twenty-eight) days.  Marland Kitchen dicyclomine (BENTYL) 10 MG capsule Take 1 capsule (10 mg total) by mouth 4 (four) times daily -  before meals and at bedtime. (Patient not taking: Reported on 11/07/2020)  . meloxicam (MOBIC) 15 MG tablet  (Patient not taking: Reported on 11/07/2020)  . [DISCONTINUED] cyclobenzaprine (FLEXERIL) 10 MG tablet Take 1 tablet (10 mg total) by mouth 3 (three) times daily as needed for muscle spasms.  . [DISCONTINUED] ketoconazole (NIZORAL) 200 MG tablet Take 1 tablet (200 mg total) by mouth daily. (Patient not taking: Reported on 11/07/2020)  . [DISCONTINUED] naproxen (NAPROSYN) 500 MG tablet Take 1 tablet (500 mg total) by mouth 2 (two) times daily with a meal. (Patient not taking: Reported on 11/07/2020)   No facility-administered medications prior to visit.    Review of Systems  Constitutional: Negative for chills and  fever.  HENT: Negative for sore throat.   Gastrointestinal: Negative for abdominal pain, anorexia, constipation, diarrhea, nausea and vomiting.  Genitourinary: Positive for vaginal bleeding. Negative for dysuria, flank pain, frequency, hematuria, missed menses, pelvic pain, urgency and vaginal discharge.   Musculoskeletal: Positive for back pain. Negative for joint pain.  Skin: Negative for rash.  Neurological: Negative for headaches.       Objective    BP (!) 145/74   Pulse 92   Temp 98.5 F (36.9 C) (Oral)   Resp 16   Wt (!) 319 lb 8 oz (144.9 kg)   SpO2 98%   BMI 56.60 kg/m     Physical Exam Vitals reviewed.  Constitutional:      General: She is not in acute distress.    Appearance: She is obese. She is not ill-appearing, toxic-appearing or diaphoretic.  HENT:     Head: Normocephalic and atraumatic.     Nose: Nose normal.     Mouth/Throat:     Mouth: Mucous membranes are moist.  Eyes:     Conjunctiva/sclera: Conjunctivae normal.  Cardiovascular:     Rate and Rhythm: Normal rate and regular rhythm.     Pulses: Normal pulses.     Heart sounds: Normal heart sounds.  Pulmonary:     Effort: Pulmonary effort is normal.     Breath sounds: Normal breath sounds.  Abdominal:     General: Bowel sounds are normal. There is no distension.     Palpations: Abdomen is soft.     Tenderness: There is no abdominal tenderness.  Skin:    General: Skin is warm.     Findings: No erythema or rash.  Neurological:     Mental Status: She is oriented to person, place, and time.  Psychiatric:        Mood and Affect: Mood normal.        Behavior: Behavior normal.        Thought Content: Thought content normal.        Judgment: Judgment normal.     Results for orders placed or performed in visit on 11/07/20  TSH  Result Value Ref Range   TSH 3.220 0.450 - 4.500 uIU/mL  CBC with Differential/Platelet  Result Value Ref Range   WBC 9.3 3.4 - 10.8 x10E3/uL   RBC 4.85 3.77 - 5.28 x10E6/uL   Hemoglobin 13.8 11.1 - 15.9 g/dL   Hematocrit 42.1 34.0 - 46.6 %   MCV 87 79 - 97 fL   MCH 28.5 26.6 - 33.0 pg   MCHC 32.8 31.5 - 35.7 g/dL   RDW 13.4 11.7 - 15.4 %   Platelets 330 150 - 450 x10E3/uL   Neutrophils 67 Not Estab. %   Lymphs 24 Not Estab. %   Monocytes 4 Not Estab. %   Eos 3  Not Estab. %   Basos 1 Not Estab. %   Neutrophils Absolute 6.3 1.4 - 7.0 x10E3/uL   Lymphocytes Absolute 2.2 0.7 - 3.1 x10E3/uL   Monocytes Absolute 0.4 0.1 - 0.9 x10E3/uL   EOS (ABSOLUTE) 0.2 0.0 - 0.4 x10E3/uL   Basophils Absolute 0.1 0.0 - 0.2 x10E3/uL   Immature Granulocytes 1 Not Estab. %   Immature Grans (Abs) 0.1 0.0 - 0.1 x10E3/uL  Comprehensive Metabolic Panel (CMET)  Result Value Ref Range   Glucose 88 65 - 99 mg/dL   BUN 9 6 - 24 mg/dL   Creatinine, Ser 0.51 (L) 0.57 - 1.00 mg/dL   eGFR 119 >59 mL/min/1.73  BUN/Creatinine Ratio 18 9 - 23   Sodium 141 134 - 144 mmol/L   Potassium 3.8 3.5 - 5.2 mmol/L   Chloride 104 96 - 106 mmol/L   CO2 19 (L) 20 - 29 mmol/L   Calcium 8.8 8.7 - 10.2 mg/dL   Total Protein 6.1 6.0 - 8.5 g/dL   Albumin 3.9 3.8 - 4.8 g/dL   Globulin, Total 2.2 1.5 - 4.5 g/dL   Albumin/Globulin Ratio 1.8 1.2 - 2.2   Bilirubin Total 0.2 0.0 - 1.2 mg/dL   Alkaline Phosphatase 95 44 - 121 IU/L   AST 22 0 - 40 IU/L   ALT 18 0 - 32 IU/L  17-Hydroxyprogesterone  Result Value Ref Range   17-Hydroxyprogesterone WILL FOLLOW   Beta hCG quant (ref lab)  Result Value Ref Range   hCG Quant <1 mIU/mL    Assessment & Plan     Episode of heavy vaginal bleeding - Plan: TSH, CBC with Differential/Platelet, Comprehensive Metabolic Panel (CMET), POCT urine pregnancy, 17-Hydroxyprogesterone, US Pelvic Complete With Transvaginal, B-HCG Quant, Ambulatory referral to Gynecology  Orders Placed This Encounter  Procedures  . US Pelvic Complete With Transvaginal  . TSH  . CBC with Differential/Platelet  . Comprehensive Metabolic Panel (CMET)  . 17-Hydroxyprogesterone  . B-HCG Quant  . Beta hCG quant (ref lab)  . Ambulatory referral to Gynecology  . POCT urine pregnancy   Red Flags discussed. The patient was given clear instructions to go to ER or return to medical center if any red flags develop, symptoms do not improve, worsen or new problems develop. They verbalized  understanding.  Return in about 2 weeks (around 11/21/2020), or if symptoms worsen or fail to improve, for at any time for any worsening symptoms.     The entirety of the information documented in the History of Present Illness, Review of Systems and Physical Exam were personally obtained by me. Portions of this information were initially documented by the CMA and reviewed by me for thoroughness and accuracy.      Marcille Buffy, [employer] [phone] (phone) [phone] (fax)  [place]

## 2020-11-08 ENCOUNTER — Telehealth: Payer: Self-pay

## 2020-11-08 NOTE — Progress Notes (Signed)
Labs ok.

## 2020-11-08 NOTE — Telephone Encounter (Signed)
[facility] referring for Episode of heavy vaginal bleeding. Called and left voicemail for patient to call back to be scheduled.

## 2020-11-10 ENCOUNTER — Encounter: Payer: Self-pay | Admitting: Adult Health

## 2020-11-11 ENCOUNTER — Telehealth: Payer: Self-pay

## 2020-11-11 ENCOUNTER — Encounter: Payer: Self-pay | Admitting: Adult Health

## 2020-11-11 ENCOUNTER — Ambulatory Visit
Admission: RE | Admit: 2020-11-11 | Discharge: 2020-11-11 | Disposition: A | Discharge status: Home / Self Care | Payer: 59 | Source: Ambulatory Visit | Attending: Adult Health | Admitting: Adult Health

## 2020-11-11 ENCOUNTER — Other Ambulatory Visit: Payer: Self-pay

## 2020-11-11 DIAGNOSIS — N888 Other specified noninflammatory disorders of cervix uteri: Secondary | ICD-10-CM | POA: Diagnosis not present

## 2020-11-11 DIAGNOSIS — N939 Abnormal uterine and vaginal bleeding, unspecified: Secondary | ICD-10-CM | POA: Diagnosis not present

## 2020-11-11 NOTE — Telephone Encounter (Signed)
Please advise 

## 2020-11-11 NOTE — Telephone Encounter (Signed)
Addressed in a phone message on same issue

## 2020-11-11 NOTE — Telephone Encounter (Signed)
Called and left voicemail for patient to call back to be scheduled. 

## 2020-11-11 NOTE — Telephone Encounter (Signed)
Small mucous cyst noted. This would not cause symptoms. No abnormal uterine wall thickening. No fibroids. No ovarian cyst. No source for abnormal bleeding noted.

## 2020-11-11 NOTE — Telephone Encounter (Signed)
Copied from [place] [id number]. Topic: General - Other >> Nov 11, 2020  2:00 PM Mcneil, Ja-Kwan wrote: Reason for CRM: Pt called in for ultrasound results. Pt requests call back.

## 2020-11-12 NOTE — Telephone Encounter (Signed)
Result note read to pt, verbalizes understanding. 

## 2020-11-12 NOTE — Telephone Encounter (Signed)
LMTCB 11/12/2020.  PEC please advise pt of results she she calls back.   Thanks,   -Mickel Baas

## 2020-11-15 ENCOUNTER — Other Ambulatory Visit: Payer: Self-pay

## 2020-11-15 MED FILL — Etodolac Tab 500 MG: ORAL | 30 days supply | Qty: 60 | Fill #0 | Status: AC

## 2020-11-16 LAB — CBC WITH DIFFERENTIAL/PLATELET
Basophils Absolute: 0.1 10*3/uL (ref 0.0–0.2)
Basos: 1 %
EOS (ABSOLUTE): 0.2 10*3/uL (ref 0.0–0.4)
Eos: 3 %
Hematocrit: 42.1 % (ref 34.0–46.6)
Hemoglobin: 13.8 g/dL (ref 11.1–15.9)
Immature Grans (Abs): 0.1 10*3/uL (ref 0.0–0.1)
Immature Granulocytes: 1 %
Lymphocytes Absolute: 2.2 10*3/uL (ref 0.7–3.1)
Lymphs: 24 %
MCH: 28.5 pg (ref 26.6–33.0)
MCHC: 32.8 g/dL (ref 31.5–35.7)
MCV: 87 fL (ref 79–97)
Monocytes Absolute: 0.4 10*3/uL (ref 0.1–0.9)
Monocytes: 4 %
Neutrophils Absolute: 6.3 10*3/uL (ref 1.4–7.0)
Neutrophils: 67 %
Platelets: 330 10*3/uL (ref 150–450)
RBC: 4.85 x10E6/uL (ref 3.77–5.28)
RDW: 13.4 % (ref 11.7–15.4)
WBC: 9.3 10*3/uL (ref 3.4–10.8)

## 2020-11-16 LAB — COMPREHENSIVE METABOLIC PANEL
ALT: 18 IU/L (ref 0–32)
AST: 22 IU/L (ref 0–40)
Albumin/Globulin Ratio: 1.8 (ref 1.2–2.2)
Albumin: 3.9 g/dL (ref 3.8–4.8)
Alkaline Phosphatase: 95 IU/L (ref 44–121)
BUN/Creatinine Ratio: 18 (ref 9–23)
BUN: 9 mg/dL (ref 6–24)
Bilirubin Total: 0.2 mg/dL (ref 0.0–1.2)
CO2: 19 mmol/L — ABNORMAL LOW (ref 20–29)
Calcium: 8.8 mg/dL (ref 8.7–10.2)
Chloride: 104 mmol/L (ref 96–106)
Creatinine, Ser: 0.51 mg/dL — ABNORMAL LOW (ref 0.57–1.00)
Globulin, Total: 2.2 g/dL (ref 1.5–4.5)
Glucose: 88 mg/dL (ref 65–99)
Potassium: 3.8 mmol/L (ref 3.5–5.2)
Sodium: 141 mmol/L (ref 134–144)
Total Protein: 6.1 g/dL (ref 6.0–8.5)
eGFR: 119 mL/min/{1.73_m2} (ref >59)

## 2020-11-16 LAB — TSH: TSH: 3.22 u[IU]/mL (ref 0.450–4.500)

## 2020-11-16 LAB — 17-HYDROXYPROGESTERONE: 17-Hydroxyprogesterone: 10 ng/dL

## 2020-11-16 LAB — BETA HCG QUANT (REF LAB): hCG Quant: <1 m[IU]/mL

## 2020-11-18 NOTE — Progress Notes (Signed)
TSH within normal limits

## 2020-11-26 NOTE — Progress Notes (Signed)
Established patient visit   Patient: Kelsey Randolph   DOB: 1975-12-09   45 y.o. Female  MRN: [id number] Visit Date: 11/27/2020  Today's healthcare provider: Marcille Buffy, FNP   Chief Complaint  Patient presents with  . Depression  . Anxiety  . Obesity    Patient would like to address weight management, she states that she is eating a balanced diet and states that she exercising 3-4x a week with mixed cardio and boxing   Subjective    HPI HPI    Obesity     Additional comments: Patient would like to address weight management, she states that she is eating a balanced diet and states that she exercising 3-4x a week with mixed cardio and boxing       Last edited by Minette Headland, CMA on 11/27/2020  8:23 AM. (History)      Depression, Follow-up  She  was last seen for this 4 months ago. Changes made at last visit include none.   She reports excellent compliance with treatment. She is not having side effects.   She reports excellent tolerance of treatment. Current symptoms include: none She feels she is Unchanged since last visit.   She has been decreasing in pant size lost three pant sizes, She is working out, Building control surveyor camp. She drinks water. One diet Dr. Malachi Bonds. She is working with a Acupuncturist she is eating the correct amount of macros and protein.  Vaginal bleeding since last visit has resolved, due to start menses in 2 days. Will let me know if any issues.  Depression screen [facility] 2/9 02/05/2020 03/13/2019 01/18/2019  Decreased Interest 0 0 0  Down, Depressed, Hopeless 0 0 1  PHQ - 2 Score 0 0 1  Altered sleeping 0 - 0  Tired, decreased energy 0 - 0  Change in appetite 0 - 0  Feeling bad or failure about yourself  0 - 0  Trouble concentrating 0 - 0  Moving slowly or fidgety/restless 0 - 0  Suicidal thoughts 0 - 0  PHQ-9 Score 0 - 1  Difficult doing work/chores Not difficult at all - Not difficult at all     ----------------------------------------------------------------------------------------- Anxiety, Follow-up  She was last seen for anxiety 4 months ago. Changes made at last visit include none.   She reports excellent compliance with treatment. She reports excellent tolerance of treatment. She is not having side effects.   She feels her anxiety is moderate and Unchanged since last visit.  Symptoms: No chest pain No difficulty concentrating  No dizziness No fatigue  No feelings of losing control No insomnia  No irritable No palpitations  No panic attacks No racing thoughts  No shortness of breath No sweating  No tremors/shakes    GAD-7 Results GAD-7 Generalized Anxiety Disorder Screening Tool 06/21/2017  1. Feeling Nervous, Anxious, or on Edge 0  2. Not Being Able to Stop or Control Worrying 0  3. Worrying Too Much About Different Things 1  4. Trouble Relaxing 0  5. Being So Restless it's Hard To Sit Still 0  6. Becoming Easily Annoyed or Irritable 1  7. Feeling Afraid As If Something Awful Might Happen 0  Total GAD-7 Score 2  Difficulty At Work, Home, or Getting  Along With Others? Not difficult at all    PHQ-9 Scores PHQ9 SCORE ONLY 02/05/2020 03/13/2019 01/18/2019  PHQ-9 Total Score 0 0 1    ---------------------------------------------------------------------------------------------------  Patient Active Problem List  Diagnosis Date Noted  . Recurrent major depressive disorder, in full remission ([place]) 01/14/2018  . Abnormal blood chemistry 01/28/2015  . Allergic rhinitis 01/28/2015  . Anxiety 01/28/2015  . Chest pain 01/28/2015  . Family planning 01/28/2015  . Acid reflux 01/28/2015  . LBP (low back pain) 01/28/2015  . Plantar fasciitis 01/28/2015  . Borderline diabetes 01/28/2015  . Psoriasis 01/28/2015  . Avitaminosis D 01/28/2015  . Disease of white blood cells 11/22/2009  . Awareness of heartbeats 03/23/2008  . Adult hypothyroidism 03/14/2008  .  History of tobacco use 11/30/2007  . Calculus of kidney 11/23/2007  . Arthritis with psoriasis ([place]) 12/03/2005  . Clinical depression 03/04/2001  . Morbidly obese ([place]) 08/10/1998   Past Medical History:  Diagnosis Date  . Allergy   . Anxiety   . Arthritis    PSORIATIC  . Depression   . Dysplastic nevus 07/27/2019   Right lateral bicep. Severe atypia, close to margin. Excised 11/14/2019, margins free.  Marland Kitchen GERD (gastroesophageal reflux disease)    RARE-NO MEDS  . History of kidney stones 2004  . Hx of dysplastic nevus 07/16/2016   Right superior medial buttocks just lateral to superior crease. Mild atypia clost to deep margin  . Hx of dysplastic nevus 07/12/2017   Left upper back sup. scapula. Severe atypia with focal fibrosis, close to lateral margin. Excised: 09/28/2017. Margins free  . Hx of dysplastic nevus 07/12/2017   Left LQA periumbilical. Moderate atypia, irritated, close to lateral margin  . Hx of dysplastic nevus 07/27/2019, exc 11/14/19   Right lateral bicep. Severe atypia, close to margin  . Hypothyroidism   . PONV (postoperative nausea and vomiting)    WITH KIDNEY STONE AND TONSILLECTOMY BUT HAD NO N/V WITH HER LAST FOOT SURGERY IN 2013  . Psoriasis    Cosentyx   No Known Allergies   Patient Active Problem List   Diagnosis Date Noted  . Recurrent major depressive disorder, in full remission ([place]) 01/14/2018  . Abnormal blood chemistry 01/28/2015  . Allergic rhinitis 01/28/2015  . Anxiety 01/28/2015  . Chest pain 01/28/2015  . Family planning 01/28/2015  . Acid reflux 01/28/2015  . LBP (low back pain) 01/28/2015  . Plantar fasciitis 01/28/2015  . Borderline diabetes 01/28/2015  . Psoriasis 01/28/2015  . Avitaminosis D 01/28/2015  . Disease of white blood cells 11/22/2009  . Awareness of heartbeats 03/23/2008  . Adult hypothyroidism 03/14/2008  . History of tobacco use 11/30/2007  . Calculus of kidney 11/23/2007  . Arthritis with psoriasis ([place]) 12/03/2005   . Clinical depression 03/04/2001  . Morbidly obese ([place]) 08/10/1998   Past Medical History:  Diagnosis Date  . Allergy   . Anxiety   . Arthritis    PSORIATIC  . Depression   . Dysplastic nevus 07/27/2019   Right lateral bicep. Severe atypia, close to margin. Excised 11/14/2019, margins free.  Marland Kitchen GERD (gastroesophageal reflux disease)    RARE-NO MEDS  . History of kidney stones 2004  . Hx of dysplastic nevus 07/16/2016   Right superior medial buttocks just lateral to superior crease. Mild atypia clost to deep margin  . Hx of dysplastic nevus 07/12/2017   Left upper back sup. scapula. Severe atypia with focal fibrosis, close to lateral margin. Excised: 09/28/2017. Margins free  . Hx of dysplastic nevus 07/12/2017   Left LQA periumbilical. Moderate atypia, irritated, close to lateral margin  . Hx of dysplastic nevus 07/27/2019, exc 11/14/19   Right lateral bicep. Severe atypia, close to margin  .  Hypothyroidism   . PONV (postoperative nausea and vomiting)    WITH KIDNEY STONE AND TONSILLECTOMY BUT HAD NO N/V WITH HER LAST FOOT SURGERY IN 2013  . Psoriasis    Cosentyx   No Known Allergies     Medications: Outpatient Medications Prior to Visit  Medication Sig  . acetaminophen (TYLENOL) 500 MG tablet Take 500 mg by mouth every 6 (six) hours as needed.  . ALPRAZolam (XANAX) 0.5 MG tablet TAKE 1 TABLET BY MOUTH AT BEDTIME AS NEEDED FOR ANXIETY.  Marland Kitchen Apple Cid Vn-Grn Tea-Bit Or-Cr (APPLE CIDER VINEGAR PLUS) TABS Take by mouth.  Marland Kitchen b complex vitamins tablet Take 1 tablet by mouth daily.  . celecoxib (CELEBREX) 100 MG capsule TAKE 1 CAPSULE BY MOUTH 2 (TWO) TIMES DAILY AS NEEDED FOR PAIN  . cetirizine (ZYRTEC) 10 MG tablet Take 10 mg by mouth daily.   . Cholecalciferol 1000 UNITS capsule Take 1,000 Units by mouth daily.   Marland Kitchen ketoconazole (NIZORAL) 200 MG tablet TAKE 1 TABLET BY MOUTH DAILY.  Marland Kitchen levothyroxine (SYNTHROID) 50 MCG tablet Take 1 tablet (50 mcg total) by mouth daily before  breakfast.  . meloxicam (MOBIC) 15 MG tablet   . Secukinumab, 300 MG Dose, 150 MG/ML SOAJ INJECT 300 MG SUBCUTANEOUSLY EVERY 28 (TWENTY-EIGHT) DAYS.  Marland Kitchen sulfaSALAzine (AZULFIDINE) 500 MG tablet Take 500 mg by mouth 2 (two) times daily.  . [DISCONTINUED] escitalopram (LEXAPRO) 20 MG tablet TAKE 1 TABLET BY MOUTH DAILY.  . clotrimazole-betamethasone (LOTRISONE) cream APPLY TO THE AFFECTED AREA(S) 2 TIMES DAILY. (Patient not taking: Reported on 11/27/2020)  . dicyclomine (BENTYL) 10 MG capsule Take 1 capsule (10 mg total) by mouth 4 (four) times daily -  before meals and at bedtime. (Patient not taking: No sig reported)  . etodolac (LODINE) 500 MG tablet TAKE 1 TABLET BY MOUTH TWICE DAILY (Patient not taking: Reported on 11/27/2020)  . Ketoconazole 2 % GEL Apply 1 application topically daily. Cover the affected and immediate surrounding area for 2 weeks (Patient not taking: Reported on 11/27/2020)  . naproxen (NAPROSYN) 500 MG tablet TAKE 1 TABLET BY MOUTH TWICE DAILY WITH A MEAL. (Patient not taking: No sig reported)  . sulfaSALAzine (AZULFIDINE) 500 MG tablet TAKE 1 TABLET BY MOUTH 2 TIMES DAILY (Patient not taking: Reported on 11/27/2020)  . [DISCONTINUED] amoxicillin (AMOXIL) 500 MG capsule TAKE 2 CAPSULES BY MOUTH NOW, THEN TAKE 1 CAPSULE BY MOUTH EVERY 6 HOURS UNTIL ALL TAKEN   No facility-administered medications prior to visit.    Review of Systems  Constitutional: Positive for unexpected weight change.  HENT: Negative.   Respiratory: Negative.   Cardiovascular: Negative.   Gastrointestinal: Negative.   Genitourinary: Negative.   Musculoskeletal: Negative.   Neurological: Negative.   Psychiatric/Behavioral: Negative.     Last CBC Lab Results  Component Value Date   WBC 9.3 11/07/2020   HGB 13.8 11/07/2020   HCT 42.1 11/07/2020   MCV 87 11/07/2020   MCH 28.5 11/07/2020   RDW 13.4 11/07/2020   PLT 330 08/65/7846   Last metabolic panel Lab Results  Component Value Date    GLUCOSE 88 11/07/2020   NA 141 11/07/2020   K 3.8 11/07/2020   CL 104 11/07/2020   CO2 19 (L) 11/07/2020   BUN 9 11/07/2020   CREATININE 0.51 (L) 11/07/2020   GFRNONAA 112 02/08/2020   GFRAA 129 02/08/2020   CALCIUM 8.8 11/07/2020   PROT 6.1 11/07/2020   ALBUMIN 3.9 11/07/2020   LABGLOB 2.2 11/07/2020   AGRATIO 1.8  11/07/2020   BILITOT 0.2 11/07/2020   ALKPHOS 95 11/07/2020   AST 22 11/07/2020   ALT 18 11/07/2020   ANIONGAP 9 04/13/2017   Last lipids Lab Results  Component Value Date   CHOL 144 02/08/2020   HDL 56 02/08/2020   LDLCALC 74 02/08/2020   TRIG 70 02/08/2020   CHOLHDL 2.4 01/18/2019   Last hemoglobin A1c Lab Results  Component Value Date   HGBA1C 5.7 (H) 02/08/2020   Last thyroid functions Lab Results  Component Value Date   TSH 3.220 11/07/2020   Last vitamin D Lab Results  Component Value Date   VD25OH 33.8 02/08/2020   Last vitamin B12 and Folate No results found for: VITAMINB12, FOLATE     Objective    BP 135/80   Pulse 92   Resp 16   Wt (!) 321 lb 3.2 oz (145.7 kg)   SpO2 100%   BMI 56.90 kg/m  BP Readings from Last 3 Encounters:  11/27/20 135/80  11/07/20 (!) 145/74  02/05/20 122/70   Wt Readings from Last 3 Encounters:  11/27/20 (!) 321 lb 3.2 oz (145.7 kg)  11/07/20 (!) 319 lb 8 oz (144.9 kg)  02/05/20 (!) 313 lb (142 kg)       Physical Exam Vitals reviewed.  Constitutional:      General: She is not in acute distress.    Appearance: She is obese. She is not ill-appearing, toxic-appearing or diaphoretic.  HENT:     Right Ear: External ear normal.     Left Ear: External ear normal.     Mouth/Throat:     Pharynx: Oropharynx is clear.  Eyes:     Conjunctiva/sclera: Conjunctivae normal.  Cardiovascular:     Rate and Rhythm: Normal rate and regular rhythm.     Pulses: Normal pulses.     Heart sounds: Normal heart sounds. No murmur heard. No friction rub. No gallop.   Pulmonary:     Effort: Pulmonary effort is normal.      Breath sounds: Normal breath sounds.  Abdominal:     Palpations: Abdomen is soft.  Musculoskeletal:        General: No swelling or deformity. Normal range of motion.     Cervical back: Neck supple.  Skin:    General: Skin is warm.     Findings: No erythema or rash.  Neurological:     Mental Status: She is alert and oriented to person, place, and time.  Psychiatric:        Mood and Affect: Mood normal.        Behavior: Behavior normal.        Thought Content: Thought content normal.        Judgment: Judgment normal.     No results found for any visits on 11/27/20.  Assessment & Plan     Anxiety - Plan: HgB A1c, B12 and Folate Panel, VITAMIN D 25 Hydroxy (Vit-D Deficiency, Fractures), buPROPion (WELLBUTRIN XL) 150 MG 24 hr tablet  Avitaminosis D - Plan: VITAMIN D 25 Hydroxy (Vit-D Deficiency, Fractures)  Orders Placed This Encounter  Procedures  . HgB A1c  . B12 and Folate Panel  . VITAMIN D 25 Hydroxy (Vit-D Deficiency, Fractures)    Meds ordered this encounter  Medications  . buPROPion (WELLBUTRIN XL) 150 MG 24 hr tablet    Sig: Take 1 tablet (150 mg total) by mouth daily.    Dispense:  90 tablet    Refill:  1   Will discontinue Lexapro  due to concern of weight gain and also weight holding.   TSH is more on hypothyroid end she has not been taking Synthroid regularly recommend she start taking more regular and also this may aid in weight loss.    She is doing the right things and working with Physiological scientist and doing boot camp congratulated on inches lost and not to focus on scales as may be gaining muscle mass.   Medications Discontinued During This Encounter  Medication Reason  . amoxicillin (AMOXIL) 500 MG capsule Completed Course  . escitalopram (LEXAPRO) 20 MG tablet Completed Course  She will decrease Lexapro to 1/2 tablet for a few days, skip one day and the start Wellbutrin.   Discussed known black box warning for anti depression/ anxiety medication.  Need to report any behavioral changes right, if any homicidal or suicidal thoughts or ideas seek medical attention right away. Call 911.    Return in about 1 month (around 12/27/2020), or if symptoms worsen or fail to improve, for at any time for any worsening symptoms, Go to Emergency room/ urgent care if worse.      The entirety of the information documented in the History of Present Illness, Review of Systems and Physical Exam were personally obtained by me. Portions of this information were initially documented by the CMA and reviewed by me for thoroughness and accuracy.     Marcille Buffy, [place] [phone] (phone) [phone] (fax)  [place]

## 2020-11-27 ENCOUNTER — Other Ambulatory Visit: Payer: Self-pay

## 2020-11-27 ENCOUNTER — Encounter: Payer: Self-pay | Admitting: Adult Health

## 2020-11-27 ENCOUNTER — Ambulatory Visit (INDEPENDENT_AMBULATORY_CARE_PROVIDER_SITE_OTHER): Payer: 59 | Admitting: Adult Health

## 2020-11-27 ENCOUNTER — Other Ambulatory Visit (HOSPITAL_COMMUNITY): Payer: Self-pay

## 2020-11-27 VITALS — BP 135/80 | HR 92 | Resp 16 | Wt 321.2 lb

## 2020-11-27 DIAGNOSIS — E559 Vitamin D deficiency, unspecified: Secondary | ICD-10-CM

## 2020-11-27 DIAGNOSIS — F419 Anxiety disorder, unspecified: Secondary | ICD-10-CM

## 2020-11-27 MED ORDER — BUPROPION HCL ER (XL) 150 MG PO TB24
150.0000 mg | ORAL_TABLET | Freq: Every day | ORAL | 1 refills | Status: DC
  Filled 2020-11-27: qty 90, 90d supply, fill #0

## 2020-11-27 MED FILL — Sulfasalazine Tab 500 MG: ORAL | 30 days supply | Qty: 60 | Fill #0 | Status: AC

## 2020-11-27 MED FILL — Secukinumab Subcutaneous Auto-inj 150 MG/ML (300 MG Dose): SUBCUTANEOUS | 28 days supply | Qty: 2 | Fill #0 | Status: CN

## 2020-11-27 NOTE — Patient Instructions (Signed)
Mediterranean Diet A Mediterranean diet refers to food and lifestyle choices that are based on the traditions of countries located on the [place]. This way of eating has been shown to help prevent certain conditions and improve outcomes for people who have chronic diseases, like kidney disease and heart disease. What are tips for following this plan? Lifestyle  Cook and eat meals together with your family, when possible.  Drink enough fluid to keep your urine clear or pale yellow.  Be physically active every day. This includes: ? Aerobic exercise like running or swimming. ? Leisure activities like gardening, walking, or housework.  Get 7-8 hours of sleep each night.  If recommended by your health care provider, drink red wine in moderation. This means 1 glass a day for nonpregnant women and 2 glasses a day for men. A glass of wine equals 5 oz (150 mL). Reading food labels  Check the serving size of packaged foods. For foods such as rice and pasta, the serving size refers to the amount of cooked product, not dry.  Check the total fat in packaged foods. Avoid foods that have saturated fat or trans fats.  Check the ingredients list for added sugars, such as corn syrup.   Shopping  At the grocery store, buy most of your food from the areas near the walls of the store. This includes: ? Fresh fruits and vegetables (produce). ? Grains, beans, nuts, and seeds. Some of these may be available in unpackaged forms or large amounts (in bulk). ? Fresh seafood. ? Poultry and eggs. ? Low-fat dairy products.  Buy whole ingredients instead of prepackaged foods.  Buy fresh fruits and vegetables in-season from local farmers markets.  Buy frozen fruits and vegetables in resealable bags.  If you do not have access to quality fresh seafood, buy precooked frozen shrimp or canned fish, such as tuna, salmon, or sardines.  Buy small amounts of raw or cooked vegetables, salads, or olives from  the deli or salad bar at your store.  Stock your pantry so you always have certain foods on hand, such as olive oil, canned tuna, canned tomatoes, rice, pasta, and beans. Cooking  Cook foods with extra-virgin olive oil instead of using butter or other vegetable oils.  Have meat as a side dish, and have vegetables or grains as your main dish. This means having meat in small portions or adding small amounts of meat to foods like pasta or stew.  Use beans or vegetables instead of meat in common dishes like chili or lasagna.  Experiment with different cooking methods. Try roasting or broiling vegetables instead of steaming or sauteing them.  Add frozen vegetables to soups, stews, pasta, or rice.  Add nuts or seeds for added healthy fat at each meal. You can add these to yogurt, salads, or vegetable dishes.  Marinate fish or vegetables using olive oil, lemon juice, garlic, and fresh herbs. Meal planning  Plan to eat 1 vegetarian meal one day each week. Try to work up to 2 vegetarian meals, if possible.  Eat seafood 2 or more times a week.  Have healthy snacks readily available, such as: ? Vegetable sticks with hummus. ? Greek yogurt. ? Fruit and nut trail mix.  Eat balanced meals throughout the week. This includes: ? Fruit: 2-3 servings a day ? Vegetables: 4-5 servings a day ? Low-fat dairy: 2 servings a day ? Fish, poultry, or lean meat: 1 serving a day ? Beans and legumes: 2 or more servings a week ?   Nuts and seeds: 1-2 servings a day ? Whole grains: 6-8 servings a day ? Extra-virgin olive oil: 3-4 servings a day  Limit red meat and sweets to only a few servings a month   What are my food choices?  Mediterranean diet ? Recommended  Grains: Whole-grain pasta. Brown rice. Bulgar wheat. Polenta. Couscous. Whole-wheat bread. [place].  Vegetables: Artichokes. Beets. Broccoli. Cabbage. Carrots. Eggplant. Green beans. Chard. Kale. Spinach. Onions. Leeks. Peas. Squash.  Tomatoes. Peppers. Radishes.  Fruits: Apples. Apricots. Avocado. Berries. Bananas. Cherries. Dates. Figs. Grapes. Lemons. Melon. Oranges. Peaches. Plums. Pomegranate.  Meats and other protein foods: Beans. Almonds. Sunflower seeds. Pine nuts. Peanuts. [place]. Salmon. Scallops. Shrimp. [place]. Tilapia. Clams. Oysters. Eggs.  Dairy: Low-fat milk. Cheese. Greek yogurt.  Beverages: Water. Red wine. Herbal tea.  Fats and oils: Extra virgin olive oil. Avocado oil. Grape seed oil.  Sweets and desserts: [place] yogurt with honey. Baked apples. Poached pears. Trail mix.  Seasoning and other foods: Basil. Cilantro. Coriander. Cumin. Mint. Parsley. Sage. Rosemary. Tarragon. Garlic. Oregano. Thyme. Pepper. Balsalmic vinegar. Tahini. Hummus. Tomato sauce. Olives. Mushrooms. ? Limit these  Grains: Prepackaged pasta or rice dishes. Prepackaged cereal with added sugar.  Vegetables: Deep fried potatoes (french fries).  Fruits: Fruit canned in syrup.  Meats and other protein foods: Beef. Pork. Lamb. Poultry with skin. Hot dogs. Bernie[place] Salines.  Dairy: Ice cream. Sour cream. Whole milk.  Beverages: Juice. Sugar-sweetened soft drinks. Beer. Liquor and spirits.  Fats and oils: Butter. Canola oil. Vegetable oil. Beef fat (tallow). Lard.  Sweets and desserts: Cookies. Cakes. Pies. Candy.  Seasoning and other foods: Mayonnaise. Premade sauces and marinades. The items listed may not be a complete list. Talk with your dietitian about what dietary choices are right for you. Summary  The Mediterranean diet includes both food and lifestyle choices.  Eat a variety of fresh fruits and vegetables, beans, nuts, seeds, and whole grains.  Limit the amount of red meat and sweets that you eat.  Talk with your health care provider about whether it is safe for you to drink red wine in moderation. This means 1 glass a day for nonpregnant women and 2 glasses a day for men. A glass of wine equals 5 oz (150 mL). This information  is not intended to replace advice given to you by your health care provider. Make sure you discuss any questions you have with your health care provider. Document Revised: 03/26/2016 Document Reviewed: 03/19/2016 Elsevier Patient Education  [place]. Bupropion extended-release tablets (Depression/Mood Disorders) What is this medicine? BUPROPION (byoo PROE pee on) is used to treat depression. This medicine may be used for other purposes; ask your health care provider or pharmacist if you have questions. COMMON BRAND NAME(S): Aplenzin, Budeprion XL, Forfivo XL, Wellbutrin XL What should I tell my health care provider before I take this medicine? They need to know if you have any of these conditions:  an eating disorder, such as anorexia or bulimia  bipolar disorder or psychosis  diabetes or high blood sugar, treated with medication  glaucoma  head injury or brain tumor  heart disease, previous heart attack, or irregular heart beat  high blood pressure  kidney or liver disease  seizures (convulsions)  suicidal thoughts or a previous suicide attempt  Tourette's syndrome  weight loss  an unusual or allergic reaction to bupropion, other medicines, foods, dyes, or preservatives  breast-feeding  pregnant or trying to become pregnant How should I use this medicine? Take this medicine by mouth  with a glass of water. Follow the directions on the prescription label. You can take it with or without food. If it upsets your stomach, take it with food. Do not crush, chew, or cut these tablets. This medicine is taken once daily at the same time each day. Do not take your medicine more often than directed. Do not stop taking this medicine suddenly except upon the advice of your doctor. Stopping this medicine too quickly may cause serious side effects or your condition may worsen. A special MedGuide will be given to you by the pharmacist with each prescription and refill. Be sure to  read this information carefully each time. Talk to your pediatrician regarding the use of this medicine in children. Special care may be needed. Overdosage: If you think you have taken too much of this medicine contact a poison control center or emergency room at once. NOTE: This medicine is only for you. Do not share this medicine with others. What if I miss a dose? If you miss a dose, skip the missed dose and take your next tablet at the regular time. Do not take double or extra doses. What may interact with this medicine? Do not take this medicine with any of the following medications:  linezolid  MAOIs like Azilect, Carbex, Eldepryl, Marplan, Nardil, and Parnate  methylene blue (injected into a vein)  other medicines that contain bupropion like Zyban This medicine may also interact with the following medications:  alcohol  certain medicines for anxiety or sleep  certain medicines for blood pressure like metoprolol, propranolol  certain medicines for depression or psychotic disturbances  certain medicines for HIV or AIDS like efavirenz, lopinavir, nelfinavir, ritonavir  certain medicines for irregular heart beat like propafenone, flecainide  certain medicines for Parkinson's disease like amantadine, levodopa  certain medicines for seizures like carbamazepine, phenytoin, phenobarbital  cimetidine  clopidogrel  cyclophosphamide  digoxin  furazolidone  isoniazid  nicotine  orphenadrine  procarbazine  steroid medicines like prednisone or cortisone  stimulant medicines for attention disorders, weight loss, or to stay awake  tamoxifen  theophylline  thiotepa  ticlopidine  tramadol  warfarin This list may not describe all possible interactions. Give your health care provider a list of all the medicines, herbs, non-prescription drugs, or dietary supplements you use. Also tell them if you smoke, drink alcohol, or use illegal drugs. Some items may interact  with your medicine. What should I watch for while using this medicine? Tell your doctor if your symptoms do not get better or if they get worse. Visit your doctor or healthcare provider for regular checks on your progress. Because it may take several weeks to see the full effects of this medicine, it is important to continue your treatment as prescribed by your doctor. This medicine may cause serious skin reactions. They can happen weeks to months after starting the medicine. Contact your healthcare provider right away if you notice fevers or flu-like symptoms with a rash. The rash may be red or purple and then turn into blisters or peeling of the skin. Or, you might notice a red rash with swelling of the face, lips or lymph nodes in your neck or under your arms. Patients and their families should watch out for new or worsening thoughts of suicide or depression. Also watch out for sudden changes in feelings such as feeling anxious, agitated, panicky, irritable, hostile, aggressive, impulsive, severely restless, overly excited and hyperactive, or not being able to sleep. If this happens, especially at the beginning  of treatment or after a change in dose, call your healthcare provider. Avoid alcoholic drinks while taking this medicine. Drinking large amounts of alcoholic beverages, using sleeping or anxiety medicines, or quickly stopping the use of these agents while taking this medicine may increase your risk for a seizure. Do not drive or use heavy machinery until you know how this medicine affects you. This medicine can impair your ability to perform these tasks. Do not take this medicine close to bedtime. It may prevent you from sleeping. Your mouth may get dry. Chewing sugarless gum or sucking hard candy, and drinking plenty of water may help. Contact your doctor if the problem does not go away or is severe. The tablet shell for some brands of this medicine does not dissolve. This is normal. The tablet  shell may appear whole in the stool. This is not a cause for concern. What side effects may I notice from receiving this medicine? Side effects that you should report to your doctor or health care professional as soon as possible:  allergic reactions like skin rash, itching or hives, swelling of the face, lips, or tongue  breathing problems  changes in vision  confusion  elevated mood, decreased need for sleep, racing thoughts, impulsive behavior  fast or irregular heartbeat  hallucinations, loss of contact with reality  increased blood pressure  rash, fever, and swollen lymph nodes  redness, blistering, peeling or loosening of the skin, including inside the mouth  seizures  suicidal thoughts or other mood changes  unusually weak or tired  vomiting Side effects that usually do not require medical attention (report to your doctor or health care professional if they continue or are bothersome):  constipation  headache  loss of appetite  nausea  tremors  weight loss This list may not describe all possible side effects. Call your doctor for medical advice about side effects. You may report side effects to FDA at 1-800-FDA-1088. Where should I keep my medicine? Keep out of the reach of children. Store at room temperature between 15 and 30 degrees C (59 and 86 degrees F). Throw away any unused medicine after the expiration date. NOTE: This sheet is a summary. It may not cover all possible information. If you have questions about this medicine, talk to your doctor, pharmacist, or health care provider.  2021 Elsevier/Gold Standard (2018-10-20 13:45:31)

## 2020-11-28 ENCOUNTER — Other Ambulatory Visit (HOSPITAL_COMMUNITY): Payer: Self-pay

## 2020-11-28 ENCOUNTER — Other Ambulatory Visit: Payer: Self-pay | Admitting: Adult Health

## 2020-11-28 ENCOUNTER — Other Ambulatory Visit: Payer: Self-pay

## 2020-11-28 ENCOUNTER — Encounter: Payer: Self-pay | Admitting: Adult Health

## 2020-11-28 DIAGNOSIS — E559 Vitamin D deficiency, unspecified: Secondary | ICD-10-CM

## 2020-11-28 LAB — B12 AND FOLATE PANEL
Folate: 11.4 ng/mL (ref >3.0)
Vitamin B-12: 732 pg/mL (ref 232–1245)

## 2020-11-28 LAB — HEMOGLOBIN A1C
Est. average glucose Bld gHb Est-mCnc: 117 mg/dL
Hgb A1c MFr Bld: 5.7 % — ABNORMAL HIGH (ref 4.8–5.6)

## 2020-11-28 LAB — VITAMIN D 25 HYDROXY (VIT D DEFICIENCY, FRACTURES): Vit D, 25-Hydroxy: 25.2 ng/mL — ABNORMAL LOW (ref 30.0–100.0)

## 2020-11-28 MED ORDER — VITAMIN D (ERGOCALCIFEROL) 1.25 MG (50000 UNIT) PO CAPS
50000.0000 [IU] | ORAL_CAPSULE | ORAL | 0 refills | Status: DC
  Filled 2020-11-28: qty 12, 84d supply, fill #0

## 2020-11-29 ENCOUNTER — Encounter: Payer: 59 | Admitting: Obstetrics and Gynecology

## 2020-12-02 ENCOUNTER — Other Ambulatory Visit (HOSPITAL_COMMUNITY): Payer: Self-pay

## 2020-12-02 MED FILL — Secukinumab Subcutaneous Auto-inj 150 MG/ML (300 MG Dose): SUBCUTANEOUS | 28 days supply | Qty: 2 | Fill #0 | Status: CN

## 2020-12-03 ENCOUNTER — Other Ambulatory Visit: Payer: Self-pay

## 2020-12-03 ENCOUNTER — Telehealth: Payer: 59 | Admitting: Physician Assistant

## 2020-12-03 ENCOUNTER — Other Ambulatory Visit (HOSPITAL_COMMUNITY): Payer: Self-pay

## 2020-12-03 DIAGNOSIS — R21 Rash and other nonspecific skin eruption: Secondary | ICD-10-CM

## 2020-12-03 MED ORDER — PREDNISONE 10 MG PO TABS
ORAL_TABLET | ORAL | 0 refills | Status: AC
  Filled 2020-12-03 (×2): qty 37, 14d supply, fill #0

## 2020-12-03 MED FILL — Secukinumab Subcutaneous Auto-inj 150 MG/ML (300 MG Dose): SUBCUTANEOUS | 28 days supply | Qty: 2 | Fill #0 | Status: AC

## 2020-12-03 NOTE — Progress Notes (Signed)
E Visit for Rash  We are sorry that you are not feeling well. Here is how we plan to help!  Based on what you shared with me it looks like you have contact dermatitis.  Contact dermatitis is a skin rash caused by something that touches the skin and causes irritation or inflammation.  Your skin may be red, swollen, dry, cracked, and itch.  The rash should go away in a few days but can last a few weeks.  If you get a rash, it's important to figure out what caused it so the irritant can be avoided in the future. and I am prescribing a two week course of steroids (37 tablets of 10 mg prednisone).  Days 1-4 take 4 tablets (40 mg) daily  Days 5-8 take 3 tablets (30 mg) daily, Days 9-11 take 2 tablets (20 mg) daily, Days 12-14 take 1 tablet (10 mg) daily.   HOME CARE:   Take cool showers and avoid direct sunlight.  Apply cool compress or wet dressings.  Take a bath in an oatmeal bath.  Sprinkle content of one Aveeno packet under running faucet with comfortably warm water.  Bathe for 15-20 minutes, 1-2 times daily.  Pat dry with a towel. Do not rub the rash.  Use hydrocortisone cream.  Take an antihistamine like Benadryl for widespread rashes that itch.  The adult dose of Benadryl is 25-50 mg by mouth 4 times daily.  Caution:  This type of medication may cause sleepiness.  Do not drink alcohol, drive, or operate dangerous machinery while taking antihistamines.  Do not take these medications if you have prostate enlargement.  Read package instructions thoroughly on all medications that you take.  GET HELP RIGHT AWAY IF:   Symptoms don't go away after treatment.  Severe itching that persists.  If you rash spreads or swells.  If you rash begins to smell.  If it blisters and opens or develops a yellow-brown crust.  You develop a fever.  You have a sore throat.  You become short of breath.  MAKE SURE YOU:  Understand these instructions. Will watch your condition. Will get help right  away if you are not doing well or get worse.  Thank you for choosing an e-visit. Your e-visit answers were reviewed by a board certified advanced clinical practitioner to complete your personal care plan. Depending upon the condition, your plan could have included both over the counter or prescription medications. Please review your pharmacy choice. Be sure that the pharmacy you have chosen is open so that you can pick up your prescription now.  If there is a problem you may message your provider in [place] to have the prescription routed to another pharmacy. Your safety is important to [place]. If you have drug allergies check your prescription carefully.  For the next 24 hours, you can use MyChart to ask questions about today's visit, request a non-urgent call back, or ask for a work or school excuse from your e-visit provider. You will get an email in the next two days asking about your experience. I hope that your e-visit has been valuable and will speed your recovery.

## 2020-12-03 NOTE — Progress Notes (Signed)
I have spent 5 minutes in review of e-visit questionnaire, review and updating patient chart, medical decision making and response to patient.   Thyra Yinger Cody Tabari Volkert, PA-C    

## 2020-12-04 ENCOUNTER — Other Ambulatory Visit (HOSPITAL_COMMUNITY): Payer: Self-pay

## 2020-12-10 ENCOUNTER — Encounter: Payer: 59 | Admitting: Obstetrics and Gynecology

## 2020-12-25 ENCOUNTER — Telehealth: Payer: Self-pay | Admitting: Adult Health

## 2020-12-25 ENCOUNTER — Encounter: Payer: Self-pay | Admitting: Adult Health

## 2020-12-25 NOTE — Telephone Encounter (Signed)
Patient tested positive for covid / home test. She has body aches, sore throat, fever 101.7, headache, tired. She is requesting antiviral medication. At time of call no appointments available.

## 2020-12-25 NOTE — Telephone Encounter (Signed)
See Telephone note for 12/25/2020.

## 2020-12-25 NOTE — Telephone Encounter (Signed)
Called and scheduled a video visit with Kelsey Randolph on 12/26/2020 at 8:30 am

## 2020-12-26 ENCOUNTER — Telehealth: Payer: 59 | Admitting: Adult Health

## 2020-12-27 ENCOUNTER — Telehealth: Payer: 59 | Admitting: Internal Medicine

## 2020-12-27 ENCOUNTER — Other Ambulatory Visit (HOSPITAL_COMMUNITY): Payer: Self-pay

## 2020-12-30 ENCOUNTER — Other Ambulatory Visit (HOSPITAL_COMMUNITY): Payer: Self-pay

## 2020-12-30 ENCOUNTER — Ambulatory Visit: Payer: 59 | Admitting: Adult Health

## 2020-12-30 MED FILL — Secukinumab Subcutaneous Auto-inj 150 MG/ML (300 MG Dose): SUBCUTANEOUS | 28 days supply | Qty: 2 | Fill #1 | Status: AC

## 2020-12-31 ENCOUNTER — Other Ambulatory Visit: Payer: Self-pay | Admitting: Physician Assistant

## 2020-12-31 ENCOUNTER — Other Ambulatory Visit: Payer: Self-pay

## 2020-12-31 DIAGNOSIS — F419 Anxiety disorder, unspecified: Secondary | ICD-10-CM

## 2020-12-31 MED FILL — Sulfasalazine Tab 500 MG: ORAL | 90 days supply | Qty: 180 | Fill #1 | Status: AC

## 2021-01-01 ENCOUNTER — Other Ambulatory Visit: Payer: Self-pay

## 2021-01-02 ENCOUNTER — Other Ambulatory Visit: Payer: Self-pay

## 2021-01-14 ENCOUNTER — Other Ambulatory Visit: Payer: Self-pay

## 2021-01-15 DIAGNOSIS — M7061 Trochanteric bursitis, right hip: Secondary | ICD-10-CM | POA: Diagnosis not present

## 2021-01-15 DIAGNOSIS — M25551 Pain in right hip: Secondary | ICD-10-CM | POA: Diagnosis not present

## 2021-01-15 DIAGNOSIS — M76891 Other specified enthesopathies of right lower limb, excluding foot: Secondary | ICD-10-CM | POA: Diagnosis not present

## 2021-01-15 DIAGNOSIS — M62838 Other muscle spasm: Secondary | ICD-10-CM | POA: Diagnosis not present

## 2021-01-20 ENCOUNTER — Other Ambulatory Visit (HOSPITAL_COMMUNITY): Payer: Self-pay

## 2021-01-20 MED FILL — Secukinumab Subcutaneous Auto-inj 150 MG/ML (300 MG Dose): SUBCUTANEOUS | 28 days supply | Qty: 2 | Fill #2 | Status: AC

## 2021-01-23 ENCOUNTER — Other Ambulatory Visit (HOSPITAL_COMMUNITY): Payer: Self-pay

## 2021-01-23 ENCOUNTER — Other Ambulatory Visit: Payer: Self-pay

## 2021-01-27 ENCOUNTER — Telehealth: Payer: 59 | Admitting: Adult Health

## 2021-02-04 ENCOUNTER — Encounter: Payer: Self-pay | Admitting: Family Medicine

## 2021-02-04 ENCOUNTER — Other Ambulatory Visit: Payer: Self-pay

## 2021-02-04 ENCOUNTER — Other Ambulatory Visit: Payer: Self-pay | Admitting: Physician Assistant

## 2021-02-04 ENCOUNTER — Ambulatory Visit: Payer: 59 | Admitting: Family Medicine

## 2021-02-04 ENCOUNTER — Other Ambulatory Visit: Payer: Self-pay | Admitting: Adult Health

## 2021-02-04 VITALS — BP 142/84 | HR 96 | Temp 97.5°F | Resp 16 | Wt 314.0 lb

## 2021-02-04 DIAGNOSIS — E039 Hypothyroidism, unspecified: Secondary | ICD-10-CM | POA: Diagnosis not present

## 2021-02-04 DIAGNOSIS — F419 Anxiety disorder, unspecified: Secondary | ICD-10-CM | POA: Diagnosis not present

## 2021-02-04 DIAGNOSIS — R7303 Prediabetes: Secondary | ICD-10-CM

## 2021-02-04 DIAGNOSIS — E559 Vitamin D deficiency, unspecified: Secondary | ICD-10-CM | POA: Diagnosis not present

## 2021-02-04 DIAGNOSIS — F3342 Major depressive disorder, recurrent, in full remission: Secondary | ICD-10-CM | POA: Diagnosis not present

## 2021-02-04 DIAGNOSIS — M25461 Effusion, right knee: Secondary | ICD-10-CM | POA: Diagnosis not present

## 2021-02-04 DIAGNOSIS — M170 Bilateral primary osteoarthritis of knee: Secondary | ICD-10-CM | POA: Diagnosis not present

## 2021-02-04 DIAGNOSIS — M25562 Pain in left knee: Secondary | ICD-10-CM | POA: Diagnosis not present

## 2021-02-04 DIAGNOSIS — M25561 Pain in right knee: Secondary | ICD-10-CM | POA: Diagnosis not present

## 2021-02-04 DIAGNOSIS — L304 Erythema intertrigo: Secondary | ICD-10-CM

## 2021-02-04 DIAGNOSIS — L4050 Arthropathic psoriasis, unspecified: Secondary | ICD-10-CM | POA: Diagnosis not present

## 2021-02-04 MED ORDER — LEVOTHYROXINE SODIUM 50 MCG PO TABS
50.0000 ug | ORAL_TABLET | Freq: Every day | ORAL | 3 refills | Status: DC
  Filled 2021-02-04: qty 90, 90d supply, fill #0
  Filled 2021-09-03: qty 90, 90d supply, fill #1

## 2021-02-04 MED ORDER — BUPROPION HCL ER (XL) 300 MG PO TB24
300.0000 mg | ORAL_TABLET | Freq: Every day | ORAL | 1 refills | Status: DC
  Filled 2021-02-04: qty 90, 90d supply, fill #0
  Filled 2021-05-30: qty 90, 90d supply, fill #1

## 2021-02-04 MED ORDER — ALPRAZOLAM 0.5 MG PO TABS
ORAL_TABLET | ORAL | 1 refills | Status: DC
  Filled 2021-02-04: qty 90, 90d supply, fill #0
  Filled 2021-07-07: qty 90, 90d supply, fill #1

## 2021-02-04 MED FILL — Celecoxib Cap 100 MG: ORAL | 30 days supply | Qty: 60 | Fill #0 | Status: AC

## 2021-02-04 NOTE — Progress Notes (Signed)
I,April Miller,acting as a Education administrator for [employer], PA-C.,have documented all relevant documentation on the behalf of Vernie Murders, PA-C,as directed by  [employer], PA-C while in the presence of [employer], PA-C.   Established patient visit   Patient: Kelsey Randolph   DOB: July 18, 1976   44 y.o. Female  MRN: [id number] Visit Date: 02/04/2021  Today's healthcare provider: Vernie Murders, PA-C   Chief Complaint  Patient presents with   Follow-up   Subjective    HPI  Anxiety, Follow-up  She was last seen for anxiety 3 months ago. Changes made at last visit include;; changed to Wellbutrin..   She reports good compliance with treatment. She reports good tolerance of treatment. She is not having side effects. none  She feels her anxiety is mild and Improved since last visit.  Symptoms: No chest pain No difficulty concentrating  No dizziness No fatigue  No feelings of losing control No insomnia  No irritable No palpitations  No panic attacks No racing thoughts  No shortness of breath No sweating  No tremors/shakes    GAD-7 Results GAD-7 Generalized Anxiety Disorder Screening Tool 06/21/2017  1. Feeling Nervous, Anxious, or on Edge 0  2. Not Being Able to Stop or Control Worrying 0  3. Worrying Too Much About Different Things 1  4. Trouble Relaxing 0  5. Being So Restless it's Hard To Sit Still 0  6. Becoming Easily Annoyed or Irritable 1  7. Feeling Afraid As If Something Awful Might Happen 0  Total GAD-7 Score 2  Difficulty At Work, Home, or Getting  Along With Others? Not difficult at all    PHQ-9 Scores PHQ9 SCORE ONLY 02/05/2020 03/13/2019 01/18/2019  PHQ-9 Total Score 0 0 1    ----------------------------------------------------------------------------   Past Medical History:  Diagnosis Date   Allergy    Anxiety    Arthritis    PSORIATIC   Depression    Dysplastic nevus 07/27/2019   Right lateral bicep. Severe atypia, close to margin.  Excised 11/14/2019, margins free.   GERD (gastroesophageal reflux disease)    RARE-NO MEDS   History of kidney stones 2004   Hx of dysplastic nevus 07/16/2016   Right superior medial buttocks just lateral to superior crease. Mild atypia clost to deep margin   Hx of dysplastic nevus 07/12/2017   Left upper back sup. scapula. Severe atypia with focal fibrosis, close to lateral margin. Excised: 09/28/2017. Margins free   Hx of dysplastic nevus 07/12/2017   Left LQA periumbilical. Moderate atypia, irritated, close to lateral margin   Hx of dysplastic nevus 07/27/2019, exc 11/14/19   Right lateral bicep. Severe atypia, close to margin   Hypothyroidism    PONV (postoperative nausea and vomiting)    WITH KIDNEY STONE AND TONSILLECTOMY BUT HAD NO N/V WITH HER LAST FOOT SURGERY IN 2013   Psoriasis    Cosentyx   Past Surgical History:  Procedure Laterality Date   FOOT SURGERY Left    kidney stones removed  2004   TARSAL TUNNEL RELEASE Left 04/16/2017   Procedure: TARSAL TUNNEL RELEASE;  Surgeon: Samara Deist, DPM;  Location: [facility] OR[facility];  Service: Podiatry;  Laterality: Left;   TENDON REPAIR Left 04/16/2017   Procedure: FLEXOR TENDON REPAIR-SECONDARY ;  Surgeon: Samara Deist, DPM;  Location: [facility] OR[facility];  Service: Podiatry;  Laterality: Left;   tonsillectomy and adenoidectomy  1993   Social History   Tobacco Use   Smoking status: Former    Packs/day: 0.50  Years: 20.00    Pack years: 10.00    Types: Cigarettes    Quit date: 08/10/2015    Years since quitting: 5.4   Smokeless tobacco: Never  Vaping Use   Vaping Use: Never used  Substance Use Topics   Alcohol use: Yes    Comment: RARE   Drug use: No   Family Status  Relation Name Status   Mother  Alive   Brother  Alive   PGF  Deceased at age 45   Father  Deceased at age 7       MI   MGM  (Not Specified)   Neg Hx  (Not Specified)   No Known Allergies     Medications: Outpatient Medications Prior to Visit  Medication Sig    acetaminophen (TYLENOL) 500 MG tablet Take 500 mg by mouth every 6 (six) hours as needed.   ALPRAZolam (XANAX) 0.5 MG tablet TAKE 1 TABLET BY MOUTH AT BEDTIME AS NEEDED FOR ANXIETY.   Apple Cid Vn-Grn Tea-Bit Or-Cr (APPLE CIDER VINEGAR PLUS) TABS Take by mouth.   b complex vitamins tablet Take 1 tablet by mouth daily.   buPROPion (WELLBUTRIN XL) 150 MG 24 hr tablet Take 1 tablet (150 mg total) by mouth daily.   celecoxib (CELEBREX) 100 MG capsule TAKE 1 CAPSULE BY MOUTH 2 (TWO) TIMES DAILY AS NEEDED FOR PAIN   cetirizine (ZYRTEC) 10 MG tablet Take 10 mg by mouth daily.    Cholecalciferol 1000 UNITS capsule Take 1,000 Units by mouth daily.    ketoconazole (NIZORAL) 200 MG tablet TAKE 1 TABLET BY MOUTH DAILY.   levothyroxine (SYNTHROID) 50 MCG tablet Take 1 tablet (50 mcg total) by mouth daily before breakfast.   meloxicam (MOBIC) 15 MG tablet    Secukinumab, 300 MG Dose, 150 MG/ML SOAJ INJECT 300 MG SUBCUTANEOUSLY EVERY 28 (TWENTY-EIGHT) DAYS.   sulfaSALAzine (AZULFIDINE) 500 MG tablet Take 500 mg by mouth 2 (two) times daily.   Vitamin D, Ergocalciferol, (DRISDOL) 1.25 MG (50000 UNIT) CAPS capsule Take 1 capsule (50,000 Units total) by mouth every 7 (seven) days.   clotrimazole-betamethasone (LOTRISONE) cream APPLY TO THE AFFECTED AREA(S) 2 TIMES DAILY. (Patient not taking: No sig reported)   dicyclomine (BENTYL) 10 MG capsule Take 1 capsule (10 mg total) by mouth 4 (four) times daily -  before meals and at bedtime. (Patient not taking: No sig reported)   etodolac (LODINE) 500 MG tablet TAKE 1 TABLET BY MOUTH TWICE DAILY (Patient not taking: No sig reported)   Ketoconazole 2 % GEL Apply 1 application topically daily. Cover the affected and immediate surrounding area for 2 weeks (Patient not taking: No sig reported)   naproxen (NAPROSYN) 500 MG tablet TAKE 1 TABLET BY MOUTH TWICE DAILY WITH A MEAL. (Patient not taking: No sig reported)   sulfaSALAzine (AZULFIDINE) 500 MG tablet TAKE 1 TABLET BY  MOUTH 2 TIMES DAILY (Patient not taking: No sig reported)   No facility-administered medications prior to visit.    Review of Systems  Constitutional:  Negative for appetite change, chills, fatigue and fever.  Respiratory:  Negative for chest tightness and shortness of breath.   Cardiovascular:  Negative for chest pain and palpitations.  Gastrointestinal:  Negative for abdominal pain, nausea and vomiting.  Neurological:  Negative for dizziness and weakness.      Objective    BP (!) 142/84 (BP Location: Right Arm, Patient Position: Sitting, Cuff Size: Large)   Pulse 96   Temp (!) 97.5 F (36.4 C) (Oral)  Resp 16   Wt (!) 314 lb (142.4 kg)   SpO2 97%   BMI 55.62 kg/m  BP Readings from Last 3 Encounters:  02/04/21 (!) 142/84  11/27/20 135/80  11/07/20 (!) 145/74   Wt Readings from Last 3 Encounters:  02/04/21 (!) 314 lb (142.4 kg)  11/27/20 (!) 321 lb 3.2 oz (145.7 kg)  11/07/20 (!) 319 lb 8 oz (144.9 kg)    Physical Exam Constitutional:      General: She is not in acute distress.    Appearance: She is well-developed.  HENT:     Head: Normocephalic and atraumatic.     Right Ear: Hearing normal.     Left Ear: Hearing normal.     Nose: Nose normal.  Eyes:     General: Lids are normal. No scleral icterus.       Right eye: No discharge.        Left eye: No discharge.     Conjunctiva/sclera: Conjunctivae normal.  Cardiovascular:     Rate and Rhythm: Normal rate and regular rhythm.     Pulses: Normal pulses.     Heart sounds: Normal heart sounds.  Pulmonary:     Effort: Pulmonary effort is normal. No respiratory distress.     Breath sounds: Normal breath sounds.  Abdominal:     General: Bowel sounds are normal.     Palpations: Abdomen is soft.  Musculoskeletal:        General: Normal range of motion.     Cervical back: Neck supple.  Skin:    Findings: No lesion or rash.  Neurological:     Mental Status: She is alert and oriented to person, place, and time.   Psychiatric:        Speech: Speech normal.        Behavior: Behavior normal.        Thought Content: Thought content normal.     No results found for any visits on 02/04/21.  Assessment & Plan     1. Anxiety Occasional anxiety flare with stress at work ([employer] being sold to [employer]). Continue efforts to lose more weight and increase Wellbutrin to 300 mg qd. Refill Xanax to help with sleep and bedtime anxiousness. Recheck labs. - buPROPion (WELLBUTRIN XL) 300 MG 24 hr tablet; Take 1 tablet (300 mg total) by mouth daily.  Dispense: 90 tablet; Refill: 1 - ALPRAZolam (XANAX) 0.5 MG tablet; TAKE 1 TABLET BY MOUTH AT BEDTIME AS NEEDED FOR ANXIETY.  Dispense: 90 tablet; Refill: 1 - CBC with Differential/Platelet - Comprehensive metabolic panel - TSH  2. Adult hypothyroidism Needs refill of Synthroid and follow up labs. - levothyroxine (SYNTHROID) 50 MCG tablet; Take 1 tablet (50 mcg total) by mouth daily before breakfast.  Dispense: 90 tablet; Refill: 3 - CBC with Differential/Platelet - Comprehensive metabolic panel - TSH - T4  3. Borderline diabetes Recheck CMP to assess fasting glucose and renal function. No polyuria, polydipsia or vision disturbance. - levothyroxine (SYNTHROID) 50 MCG tablet; Take 1 tablet (50 mcg total) by mouth daily before breakfast.  Dispense: 90 tablet; Refill: 3 - Comprehensive metabolic panel  4. Recurrent major depressive disorder, in full remission ([place]) Sleeping well and better eating habits with weight loss diet and regular exercise. Continue Wellbutrin.  5. Arthritis with psoriasis ([place]) Well controlled with Cosentyx and follow up with rheumatologist.  6. Avitaminosis D Still taking Vitamin D supplement. Recheck labs. - CBC with Differential/Platelet - VITAMIN D 25 Hydroxy (Vit-D Deficiency, Fractures)  No follow-ups on file.      I, Gudelia Eugene, PA-C, have reviewed all documentation for this visit. The documentation on  02/04/21 for the exam, diagnosis, procedures, and orders are all accurate and complete.    Vernie Murders, PA-C  [employer] [phone] (phone) [phone] (fax)  [place]

## 2021-02-05 LAB — COMPREHENSIVE METABOLIC PANEL
ALT: 11 IU/L (ref 0–32)
AST: 15 IU/L (ref 0–40)
Albumin/Globulin Ratio: 1.6 (ref 1.2–2.2)
Albumin: 3.9 g/dL (ref 3.8–4.8)
Alkaline Phosphatase: 83 IU/L (ref 44–121)
BUN/Creatinine Ratio: 15 (ref 9–23)
BUN: 9 mg/dL (ref 6–24)
Bilirubin Total: 0.4 mg/dL (ref 0.0–1.2)
CO2: 23 mmol/L (ref 20–29)
Calcium: 9.3 mg/dL (ref 8.7–10.2)
Chloride: 104 mmol/L (ref 96–106)
Creatinine, Ser: 0.62 mg/dL (ref 0.57–1.00)
Globulin, Total: 2.4 g/dL (ref 1.5–4.5)
Glucose: 115 mg/dL — ABNORMAL HIGH (ref 65–99)
Potassium: 4.3 mmol/L (ref 3.5–5.2)
Sodium: 142 mmol/L (ref 134–144)
Total Protein: 6.3 g/dL (ref 6.0–8.5)
eGFR: 113 mL/min/{1.73_m2} (ref >59)

## 2021-02-05 LAB — CBC WITH DIFFERENTIAL/PLATELET
Basophils Absolute: 0.1 10*3/uL (ref 0.0–0.2)
Basos: 1 %
EOS (ABSOLUTE): 0.4 10*3/uL (ref 0.0–0.4)
Eos: 4 %
Hematocrit: 43.5 % (ref 34.0–46.6)
Hemoglobin: 14.4 g/dL (ref 11.1–15.9)
Immature Grans (Abs): 0 10*3/uL (ref 0.0–0.1)
Immature Granulocytes: 0 %
Lymphocytes Absolute: 1.7 10*3/uL (ref 0.7–3.1)
Lymphs: 16 %
MCH: 28.3 pg (ref 26.6–33.0)
MCHC: 33.1 g/dL (ref 31.5–35.7)
MCV: 86 fL (ref 79–97)
Monocytes Absolute: 0.4 10*3/uL (ref 0.1–0.9)
Monocytes: 4 %
Neutrophils Absolute: 8.5 10*3/uL — ABNORMAL HIGH (ref 1.4–7.0)
Neutrophils: 75 %
Platelets: 323 10*3/uL (ref 150–450)
RBC: 5.09 x10E6/uL (ref 3.77–5.28)
RDW: 13.1 % (ref 11.7–15.4)
WBC: 11.1 10*3/uL — ABNORMAL HIGH (ref 3.4–10.8)

## 2021-02-05 LAB — T4: T4, Total: 8.4 ug/dL (ref 4.5–12.0)

## 2021-02-05 LAB — TSH: TSH: 2.41 u[IU]/mL (ref 0.450–4.500)

## 2021-02-05 LAB — VITAMIN D 25 HYDROXY (VIT D DEFICIENCY, FRACTURES): Vit D, 25-Hydroxy: 54.1 ng/mL (ref 30.0–100.0)

## 2021-02-06 ENCOUNTER — Ambulatory Visit: Payer: 59 | Admitting: Adult Health

## 2021-02-06 ENCOUNTER — Other Ambulatory Visit: Payer: Self-pay

## 2021-02-06 ENCOUNTER — Encounter: Payer: 59 | Admitting: Physician Assistant

## 2021-02-06 MED ORDER — CLOTRIMAZOLE-BETAMETHASONE 1-0.05 % EX CREA
TOPICAL_CREAM | CUTANEOUS | 0 refills | Status: DC
  Filled 2021-02-06: qty 45, 7d supply, fill #0

## 2021-02-06 NOTE — Telephone Encounter (Signed)
Blood tests essentially normal with only minor variations. Vitamin D level back in the middle of normal range. Taking Vitamin-D 1000 IU qd (OTC) should keep this in good shape. Don't need the 50,000 IU now. Follow up with someone here in 6 months. Continue present medication regimen.

## 2021-02-11 ENCOUNTER — Other Ambulatory Visit (HOSPITAL_COMMUNITY): Payer: Self-pay

## 2021-02-12 ENCOUNTER — Other Ambulatory Visit: Payer: Self-pay

## 2021-02-12 ENCOUNTER — Other Ambulatory Visit (HOSPITAL_COMMUNITY): Payer: Self-pay

## 2021-02-12 ENCOUNTER — Ambulatory Visit: Payer: 59 | Admitting: Internal Medicine

## 2021-02-13 ENCOUNTER — Other Ambulatory Visit (HOSPITAL_COMMUNITY): Payer: Self-pay

## 2021-02-15 ENCOUNTER — Other Ambulatory Visit (HOSPITAL_COMMUNITY): Payer: Self-pay

## 2021-02-17 ENCOUNTER — Other Ambulatory Visit (HOSPITAL_COMMUNITY): Payer: Self-pay

## 2021-02-17 MED FILL — Secukinumab Subcutaneous Auto-inj 150 MG/ML (300 MG Dose): SUBCUTANEOUS | 28 days supply | Qty: 2 | Fill #3 | Status: CN

## 2021-03-12 ENCOUNTER — Other Ambulatory Visit: Payer: Self-pay | Admitting: Family Medicine

## 2021-03-12 DIAGNOSIS — Z1231 Encounter for screening mammogram for malignant neoplasm of breast: Secondary | ICD-10-CM

## 2021-05-08 ENCOUNTER — Encounter: Payer: 59 | Admitting: Adult Health

## 2021-05-09 ENCOUNTER — Other Ambulatory Visit: Payer: Self-pay

## 2021-05-09 MED ORDER — BENZONATATE 100 MG PO CAPS
ORAL_CAPSULE | ORAL | 0 refills | Status: DC
  Filled 2021-05-09: qty 30, 10d supply, fill #0

## 2021-05-09 MED ORDER — AZELASTINE-FLUTICASONE 137-50 MCG/ACT NA SUSP
NASAL | 0 refills | Status: DC
  Filled 2021-05-09: qty 23, 30d supply, fill #0

## 2021-05-12 ENCOUNTER — Ambulatory Visit: Payer: Self-pay

## 2021-05-12 NOTE — Telephone Encounter (Signed)
Summary: sinus symptoms   Pt called to get an appt for today or tomorrow/ pt is experiencing sinus drainage, cough and congestion since last Thursday / pt stated she tested and is negative for covid / office advised to have pt triaged due to no appts open this week / please advise  Pt stated when a nurse calls/ if no answer leave a message because her phone sometimes doesn't let calls through      Reason for Disposition  Wheezing is present  Answer Assessment - Initial Assessment Questions 1. ONSET: "When did the cough begin?"      Wednesday night 2. SEVERITY: "How bad is the cough today?"      BAd spells 3. SPUTUM: "Describe the color of your sputum" (none, dry cough; clear, white, yellow, green)     Dark yellow 4. HEMOPTYSIS: "Are you coughing up any blood?" If so ask: "How much?" (flecks, streaks, tablespoons, etc.)     no 5. DIFFICULTY BREATHING: "Are you having difficulty breathing?" If Yes, ask: "How bad is it?" (e.g., mild, moderate, severe)    - MILD: No SOB at rest, mild SOB with walking, speaks normally in sentences, can lie down, no retractions, pulse < 100.    - MODERATE: SOB at rest, SOB with minimal exertion and prefers to sit, cannot lie down flat, speaks in phrases, mild retractions, audible wheezing, pulse 100-120.    - SEVERE: Very SOB at rest, speaks in single words, struggling to breathe, sitting hunched forward, retractions, pulse > 120      no 6. FEVER: "Do you have a fever?" If Yes, ask: "What is your temperature, how was it measured, and when did it start?"     LGT 99.9 Friday, not presently 7. CARDIAC HISTORY: "Do you have any history of heart disease?" (e.g., heart attack, congestive heart failure)       8. LUNG HISTORY: "Do you have any history of lung disease?"  (e.g., pulmonary embolus, asthma, emphysema)      9. PE RISK FACTORS: "Do you have a history of blood clots?" (or: recent major surgery, recent prolonged travel, bedridden)      10. OTHER SYMPTOMS:  "Do you have any other symptoms?" (e.g., runny nose, wheezing, chest pain)      Wheezing,  Protocols used: Cough - Acute Productive-A-AH

## 2021-05-12 NOTE — Telephone Encounter (Signed)
Pt reports productive cough, yellowish dark phlegm,"Bad coughing spells." Reports "Congestion, rattling in chest." Also reports wheezing. Had temp Friday 99.9, not presently. Covid tested Saturday, negative. Did tele Visit with "[facility]" Friday, Tessalon Pearls prescribed, another tele visit Saturday, Amoxicillin prescribed. Pt requested prednisone be called in, advised likely need to be seen first. Pt states "I know it's only been 2 days on antibiotics but I'm worried about my chest."  Advised UC, pt states "OK."  Hung up before care advise given.

## 2021-05-13 ENCOUNTER — Other Ambulatory Visit: Payer: Self-pay

## 2021-05-13 MED ORDER — PREDNISONE 20 MG PO TABS
ORAL_TABLET | ORAL | 0 refills | Status: DC
  Filled 2021-05-13: qty 14, 7d supply, fill #0

## 2021-05-13 MED ORDER — FLUCONAZOLE 150 MG PO TABS
ORAL_TABLET | ORAL | 1 refills | Status: DC
  Filled 2021-05-13: qty 1, 1d supply, fill #0

## 2021-05-13 MED ORDER — AZELASTINE HCL 0.1 % NA SOLN
NASAL | 1 refills | Status: DC
  Filled 2021-05-13: qty 30, 50d supply, fill #0

## 2021-05-13 MED ORDER — ALBUTEROL SULFATE HFA 108 (90 BASE) MCG/ACT IN AERS
INHALATION_SPRAY | RESPIRATORY_TRACT | 2 refills | Status: DC
  Filled 2021-05-13: qty 18, 25d supply, fill #0

## 2021-05-13 MED ORDER — HYDROCOD POLST-CPM POLST ER 10-8 MG/5ML PO SUER
ORAL | 0 refills | Status: DC
  Filled 2021-05-13: qty 115, 11d supply, fill #0

## 2021-05-30 ENCOUNTER — Other Ambulatory Visit: Payer: Self-pay

## 2021-05-30 ENCOUNTER — Ambulatory Visit
Admission: RE | Admit: 2021-05-30 | Discharge: 2021-05-30 | Disposition: A | Discharge status: Home / Self Care | Payer: BC Managed Care – PPO | Source: Ambulatory Visit | Attending: Family Medicine | Admitting: Family Medicine

## 2021-05-30 DIAGNOSIS — Z1231 Encounter for screening mammogram for malignant neoplasm of breast: Secondary | ICD-10-CM | POA: Diagnosis not present

## 2021-06-02 ENCOUNTER — Other Ambulatory Visit: Payer: Self-pay

## 2021-06-09 ENCOUNTER — Other Ambulatory Visit: Payer: Self-pay | Admitting: Physician Assistant

## 2021-06-09 ENCOUNTER — Other Ambulatory Visit: Payer: Self-pay

## 2021-06-09 DIAGNOSIS — L304 Erythema intertrigo: Secondary | ICD-10-CM

## 2021-06-10 ENCOUNTER — Other Ambulatory Visit: Payer: Self-pay

## 2021-06-10 ENCOUNTER — Other Ambulatory Visit: Payer: Self-pay | Admitting: Physician Assistant

## 2021-06-10 DIAGNOSIS — L304 Erythema intertrigo: Secondary | ICD-10-CM

## 2021-06-11 ENCOUNTER — Other Ambulatory Visit: Payer: Self-pay

## 2021-06-11 MED ORDER — ERGOCALCIFEROL 1.25 MG (50000 UT) PO CAPS
ORAL_CAPSULE | ORAL | 3 refills | Status: DC
  Filled 2021-06-11: qty 4, 28d supply, fill #0
  Filled 2021-07-07: qty 4, 28d supply, fill #1
  Filled 2021-08-07 (×2): qty 4, 28d supply, fill #2
  Filled 2021-11-07: qty 4, 28d supply, fill #3

## 2021-06-12 ENCOUNTER — Other Ambulatory Visit: Payer: Self-pay

## 2021-06-12 ENCOUNTER — Other Ambulatory Visit: Payer: Self-pay | Admitting: Physician Assistant

## 2021-06-12 DIAGNOSIS — L304 Erythema intertrigo: Secondary | ICD-10-CM

## 2021-06-16 ENCOUNTER — Other Ambulatory Visit: Payer: Self-pay

## 2021-06-16 MED ORDER — NEOMYCIN-POLYMYXIN-DEXAMETH 3.5-10000-0.1 OP SUSP
OPHTHALMIC | 0 refills | Status: DC
  Filled 2021-06-16: qty 5, 7d supply, fill #0

## 2021-06-17 ENCOUNTER — Other Ambulatory Visit: Payer: Self-pay

## 2021-07-07 ENCOUNTER — Other Ambulatory Visit: Payer: Self-pay

## 2021-07-30 ENCOUNTER — Ambulatory Visit: Payer: BC Managed Care – PPO | Admitting: Dermatology

## 2021-07-30 ENCOUNTER — Other Ambulatory Visit: Payer: Self-pay

## 2021-07-30 DIAGNOSIS — L4050 Arthropathic psoriasis, unspecified: Secondary | ICD-10-CM | POA: Diagnosis not present

## 2021-07-30 DIAGNOSIS — L720 Epidermal cyst: Secondary | ICD-10-CM | POA: Diagnosis not present

## 2021-07-30 DIAGNOSIS — L409 Psoriasis, unspecified: Secondary | ICD-10-CM

## 2021-07-30 DIAGNOSIS — L858 Other specified epidermal thickening: Secondary | ICD-10-CM

## 2021-07-30 DIAGNOSIS — L814 Other melanin hyperpigmentation: Secondary | ICD-10-CM

## 2021-07-30 DIAGNOSIS — L821 Other seborrheic keratosis: Secondary | ICD-10-CM

## 2021-07-30 DIAGNOSIS — D485 Neoplasm of uncertain behavior of skin: Secondary | ICD-10-CM | POA: Diagnosis not present

## 2021-07-30 DIAGNOSIS — D229 Melanocytic nevi, unspecified: Secondary | ICD-10-CM

## 2021-07-30 DIAGNOSIS — L578 Other skin changes due to chronic exposure to nonionizing radiation: Secondary | ICD-10-CM

## 2021-07-30 DIAGNOSIS — D1800 Hemangioma unspecified site: Secondary | ICD-10-CM

## 2021-07-30 DIAGNOSIS — Z1283 Encounter for screening for malignant neoplasm of skin: Secondary | ICD-10-CM

## 2021-07-30 MED ORDER — VTAMA 1 % EX CREA: 1.0000 "application " | TOPICAL_CREAM | Freq: Every day | CUTANEOUS | 3 refills | Status: DC

## 2021-07-30 NOTE — Progress Notes (Signed)
Follow-Up Visit   Subjective  Kelsey Randolph is a 45 y.o. female who presents for the following: Annual Exam (Hx dysplastic nevus ).  The patient presents for Total-Body Skin Exam (TBSE) for skin cancer screening and mole check.  The patient has spots, moles and lesions to be evaluated, some may be new or changing and the patient has concerns that these could be cancer.  The following portions of the chart were reviewed this encounter and updated as appropriate:   Tobacco   Allergies   Meds   Problems   Med Hx   Surg Hx   Fam Hx      Review of Systems:  No other skin or systemic complaints except as noted in HPI or Assessment and Plan.  Objective  Well appearing patient in no apparent distress; mood and affect are within normal limits.  A full examination was performed including scalp, head, eyes, ears, nose, lips, neck, chest, axillae, abdomen, back, buttocks, bilateral upper extremities, bilateral lower extremities, hands, feet, fingers, toes, fingernails, and toenails. All findings within normal limits unless otherwise noted below.  B/L elbow Plaques on the elbows.  L med canthus, R infranasal Smooth white papule(s).   B/L arm Tiny follicular keratotic papules.   R mid back paraspinal 0.4 cm Irregular brown macule.  R abdomen 0.6 cm dark papule.   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 It is associated with an increased risk of inflammatory bowel disease, heart disease, non-alcoholic fatty liver disease, and depression.    Start Vtama cream to aa's psoriasis QD PRN. Samples given.  Continue Cosentyx as prescribed by rheumatologist.  Tapinarof (VTAMA) 1 % CREA - B/L  elbow Apply 1 application topically daily. Apply to aa's psoriasis QD PRN.  Milium L med canthus, R infranasal Benign-appearing.  Observation.  Call clinic for new or changing lesions.  Recommend daily use of broad spectrum spf 30+ sunscreen to sun-exposed areas.   Keratosis pilaris B/L arm Treatable but not curable.  Is genetic.   May use AmLactin rapid relief lotion. Benign-appearing.  Observation.  Call clinic for new or changing lesions.  Recommend daily use of broad spectrum spf 30+ sunscreen to sun-exposed areas.   Neoplasm of uncertain behavior of skin (2) R mid back paraspinal Epidermal / dermal shaving  Lesion diameter (cm):  0.4 Informed consent: discussed and consent obtained   Timeout: patient name, date of birth, surgical site, and procedure verified   Procedure prep:  Patient was prepped and draped in usual sterile fashion Prep type:  Isopropyl alcohol Anesthesia: the lesion was anesthetized in a standard fashion   Anesthetic:  1% lidocaine w/ epinephrine 1-100,000 buffered w/ 8.4% NaHCO3 Instrument used: flexible razor blade   Hemostasis achieved with: pressure, aluminum chloride and electrodesiccation   Outcome: patient tolerated procedure well   Post-procedure details: sterile dressing applied and wound care instructions given   Dressing type: bandage and petrolatum    Specimen 1 - Surgical pathology Differential Diagnosis: D48.5 r/o dysplastic nevus  Check Margins: No  R abdomen Epidermal / dermal shaving  Lesion diameter (cm):  0.6 Informed consent: discussed and consent obtained   Timeout: patient name, date of birth, surgical site, and procedure verified   Procedure prep:  Patient was prepped and draped in usual sterile fashion Prep type:  Isopropyl alcohol Anesthesia: the lesion was anesthetized in a standard fashion   Anesthetic:  1% lidocaine w/ epinephrine 1-100,000 buffered w/ 8.4% NaHCO3 Instrument used: flexible razor blade   Hemostasis  achieved with: pressure, aluminum chloride and electrodesiccation   Outcome: patient tolerated procedure well   Post-procedure details: sterile dressing applied and wound care instructions given   Dressing type: bandage and petrolatum    Specimen 2 - Surgical pathology Differential Diagnosis: D48.5 r/o hemangioma vs blue nevus vs other Check Margins: No  Lentigines - Scattered tan macules - Due to sun exposure - Benign-appearing, observe - Recommend daily broad spectrum sunscreen SPF 30+ to sun-exposed areas, reapply every 2 hours as needed. - Call for any changes  Seborrheic Keratoses - Stuck-on, waxy, tan-brown papules and/or plaques  - Benign-appearing - Discussed benign etiology and prognosis. - Observe - Call for any changes  Melanocytic Nevi - Tan-brown and/or pink-flesh-colored symmetric macules and papules - Benign appearing on exam today - Observation - Call clinic for new or changing moles - Recommend daily use of broad spectrum spf 30+ sunscreen to sun-exposed areas.   Hemangiomas - Red papules - Discussed benign nature - Observe - Call for any changes  Actinic Damage - Chronic condition, secondary to cumulative UV/sun exposure - diffuse scaly erythematous macules with underlying dyspigmentation - Recommend daily broad spectrum sunscreen SPF 30+ to sun-exposed areas, reapply every 2 hours as needed.  - Staying in the shade or wearing long sleeves, sun glasses (UVA+UVB protection) and wide brim hats (4-inch brim around the entire circumference of the hat) are also recommended for sun protection.  - Call for new or changing lesions.  Skin cancer screening performed today.  Return in about 6 months (around 01/28/2022) for TBSE.  Luther Redo, CMA, am acting as scribe for Sarina Ser, MD . Documentation: I have reviewed the above documentation for accuracy and completeness, and I agree with the above.  Sarina Ser, MD

## 2021-07-30 NOTE — Patient Instructions (Addendum)

## 2021-08-07 ENCOUNTER — Other Ambulatory Visit: Payer: Self-pay

## 2021-08-07 ENCOUNTER — Telehealth: Payer: Self-pay

## 2021-08-07 ENCOUNTER — Other Ambulatory Visit (HOSPITAL_COMMUNITY): Payer: Self-pay

## 2021-08-07 NOTE — Telephone Encounter (Signed)
Spoke with pt and informed her of results. She had no concerns.

## 2021-08-07 NOTE — Telephone Encounter (Signed)
-----   Message from Ralene Bathe, MD sent at 08/04/2021  8:11 PM EST ----- Diagnosis 1. Skin , R mid back paraspinal DYSPLASTIC JUNCTIONAL LENTIGINOUS NEVUS WITH MODERATE ATYPIA, LIMITED MARGINS FREE 2. Skin , R abdomen HEMANGIOMA WITH THROMBOSIS  1- Moderate dysplastic Recheck next visit 2- benign hemangioma with thrombosis = collection of blood vessels with clotted blood No further treatment needed

## 2021-08-07 NOTE — Telephone Encounter (Signed)
LM on VM please return my call  

## 2021-08-10 ENCOUNTER — Encounter: Payer: Self-pay | Admitting: Dermatology

## 2021-09-01 ENCOUNTER — Encounter: Payer: 59 | Admitting: Family Medicine

## 2021-09-02 ENCOUNTER — Other Ambulatory Visit: Payer: Self-pay

## 2021-09-02 MED ORDER — HYDROCHLOROTHIAZIDE 25 MG PO TABS
25.0000 mg | ORAL_TABLET | Freq: Every day | ORAL | 11 refills | Status: DC
  Filled 2021-09-02: qty 30, 30d supply, fill #0
  Filled 2021-10-01: qty 30, 30d supply, fill #1
  Filled 2021-11-07: qty 30, 30d supply, fill #2
  Filled 2021-12-15: qty 30, 30d supply, fill #3
  Filled 2022-01-08: qty 30, 30d supply, fill #4
  Filled 2022-02-16: qty 30, 30d supply, fill #5
  Filled 2022-03-19: qty 30, 30d supply, fill #6
  Filled 2022-04-16: qty 30, 30d supply, fill #7
  Filled 2022-05-14: qty 30, 30d supply, fill #8
  Filled 2022-06-11: qty 30, 30d supply, fill #9
  Filled 2022-07-08: qty 30, 30d supply, fill #10

## 2021-09-02 MED ORDER — UNIFINE PENTIPS 31G X 8 MM MISC
12 refills | Status: DC
  Filled 2021-10-09: qty 100, 90d supply, fill #0
  Filled 2021-12-15: qty 100, 90d supply, fill #1

## 2021-09-02 MED ORDER — MOUNJARO 2.5 MG/0.5ML ~~LOC~~ SOAJ
SUBCUTANEOUS | 0 refills | Status: DC
  Filled 2021-09-02 – 2021-09-12 (×4): qty 2, 28d supply, fill #0

## 2021-09-03 ENCOUNTER — Other Ambulatory Visit: Payer: Self-pay

## 2021-09-04 ENCOUNTER — Other Ambulatory Visit: Payer: Self-pay

## 2021-09-05 ENCOUNTER — Other Ambulatory Visit: Payer: Self-pay

## 2021-09-05 MED FILL — Sulfasalazine Tab 500 MG: ORAL | 30 days supply | Qty: 60 | Fill #2 | Status: AC

## 2021-09-08 ENCOUNTER — Other Ambulatory Visit: Payer: Self-pay

## 2021-09-08 MED ORDER — BUPROPION HCL ER (XL) 300 MG PO TB24
ORAL_TABLET | ORAL | 5 refills | Status: DC
  Filled 2021-09-08: qty 30, 30d supply, fill #0
  Filled 2022-01-22: qty 30, 30d supply, fill #1
  Filled 2022-02-16: qty 30, 30d supply, fill #2
  Filled 2022-03-20: qty 30, 30d supply, fill #3
  Filled 2022-04-16: qty 30, 30d supply, fill #4
  Filled 2022-05-14: qty 30, 30d supply, fill #5

## 2021-09-09 ENCOUNTER — Other Ambulatory Visit: Payer: Self-pay

## 2021-09-09 MED ORDER — BUPROPION HCL ER (XL) 300 MG PO TB24
300.0000 mg | ORAL_TABLET | Freq: Every day | ORAL | 1 refills | Status: DC
  Filled 2021-09-09 – 2021-10-24 (×2): qty 90, 90d supply, fill #0
  Filled 2021-12-15: qty 90, 90d supply, fill #1

## 2021-09-09 MED ORDER — ALPRAZOLAM 0.5 MG PO TABS
ORAL_TABLET | ORAL | 1 refills | Status: DC
  Filled 2021-09-10 – 2021-10-10 (×2): qty 90, 90d supply, fill #0
  Filled 2022-02-20: qty 90, 90d supply, fill #1

## 2021-09-10 ENCOUNTER — Other Ambulatory Visit: Payer: Self-pay

## 2021-09-12 ENCOUNTER — Other Ambulatory Visit: Payer: Self-pay

## 2021-09-12 MED ORDER — OZEMPIC (0.25 OR 0.5 MG/DOSE) 2 MG/1.5ML ~~LOC~~ SOPN
PEN_INJECTOR | SUBCUTANEOUS | 0 refills | Status: DC
  Filled 2021-09-12: qty 1.5, 56d supply, fill #0

## 2021-09-16 ENCOUNTER — Other Ambulatory Visit: Payer: Self-pay

## 2021-09-16 MED ORDER — SAXENDA 18 MG/3ML ~~LOC~~ SOPN
PEN_INJECTOR | SUBCUTANEOUS | 0 refills | Status: DC
  Filled 2021-09-16 – 2021-10-09 (×4): qty 15, 30d supply, fill #0

## 2021-09-18 ENCOUNTER — Other Ambulatory Visit: Payer: Self-pay

## 2021-09-22 ENCOUNTER — Other Ambulatory Visit: Payer: Self-pay

## 2021-10-01 ENCOUNTER — Other Ambulatory Visit: Payer: Self-pay

## 2021-10-02 ENCOUNTER — Other Ambulatory Visit: Payer: Self-pay

## 2021-10-03 ENCOUNTER — Other Ambulatory Visit: Payer: Self-pay

## 2021-10-09 ENCOUNTER — Other Ambulatory Visit: Payer: Self-pay

## 2021-10-10 ENCOUNTER — Other Ambulatory Visit: Payer: Self-pay

## 2021-10-14 ENCOUNTER — Other Ambulatory Visit: Payer: Self-pay

## 2021-10-24 ENCOUNTER — Other Ambulatory Visit: Payer: Self-pay

## 2021-11-04 ENCOUNTER — Other Ambulatory Visit: Payer: Self-pay

## 2021-11-04 MED ORDER — SAXENDA 18 MG/3ML ~~LOC~~ SOPN
PEN_INJECTOR | SUBCUTANEOUS | 3 refills | Status: DC
  Filled 2021-11-04: qty 15, 30d supply, fill #0
  Filled 2021-12-15: qty 15, 30d supply, fill #1
  Filled 2022-01-22: qty 15, 30d supply, fill #2
  Filled 2022-02-20 – 2022-03-04 (×3): qty 15, 30d supply, fill #3

## 2021-11-07 ENCOUNTER — Other Ambulatory Visit: Payer: Self-pay

## 2021-11-10 ENCOUNTER — Emergency Department
Admission: EM | Admit: 2021-11-10 | Discharge: 2021-11-10 | Disposition: A | Discharge status: Home / Self Care | Payer: BC Managed Care – PPO | Attending: Emergency Medicine | Admitting: Emergency Medicine

## 2021-11-10 ENCOUNTER — Emergency Department: Payer: BC Managed Care – PPO

## 2021-11-10 ENCOUNTER — Other Ambulatory Visit: Payer: Self-pay

## 2021-11-10 DIAGNOSIS — R1010 Upper abdominal pain, unspecified: Secondary | ICD-10-CM

## 2021-11-10 DIAGNOSIS — R0781 Pleurodynia: Secondary | ICD-10-CM | POA: Insufficient documentation

## 2021-11-10 DIAGNOSIS — E039 Hypothyroidism, unspecified: Secondary | ICD-10-CM | POA: Diagnosis not present

## 2021-11-10 DIAGNOSIS — E876 Hypokalemia: Secondary | ICD-10-CM | POA: Insufficient documentation

## 2021-11-10 DIAGNOSIS — R748 Abnormal levels of other serum enzymes: Secondary | ICD-10-CM | POA: Insufficient documentation

## 2021-11-10 DIAGNOSIS — D72829 Elevated white blood cell count, unspecified: Secondary | ICD-10-CM | POA: Insufficient documentation

## 2021-11-10 DIAGNOSIS — Z720 Tobacco use: Secondary | ICD-10-CM | POA: Insufficient documentation

## 2021-11-10 DIAGNOSIS — R1011 Right upper quadrant pain: Secondary | ICD-10-CM | POA: Insufficient documentation

## 2021-11-10 LAB — COMPREHENSIVE METABOLIC PANEL
ALT: 13 U/L (ref 0–44)
AST: 15 U/L (ref 15–41)
Albumin: 3.6 g/dL (ref 3.5–5.0)
Alkaline Phosphatase: 67 U/L (ref 38–126)
Anion gap: 8 (ref 5–15)
BUN: 14 mg/dL (ref 6–20)
CO2: 30 mmol/L (ref 22–32)
Calcium: 8.9 mg/dL (ref 8.9–10.3)
Chloride: 99 mmol/L (ref 98–111)
Creatinine, Ser: 0.77 mg/dL (ref 0.44–1.00)
GFR, Estimated: >60 mL/min (ref >60)
Glucose, Bld: 110 mg/dL — ABNORMAL HIGH (ref 70–99)
Potassium: 3.1 mmol/L — ABNORMAL LOW (ref 3.5–5.1)
Sodium: 137 mmol/L (ref 135–145)
Total Bilirubin: 0.8 mg/dL (ref 0.3–1.2)
Total Protein: 7 g/dL (ref 6.5–8.1)

## 2021-11-10 LAB — CBC
HCT: 44.8 % (ref 36.0–46.0)
Hemoglobin: 14.9 g/dL (ref 12.0–15.0)
MCH: 29.3 pg (ref 26.0–34.0)
MCHC: 33.3 g/dL (ref 30.0–36.0)
MCV: 88 fL (ref 80.0–100.0)
Platelets: 297 10*3/uL (ref 150–400)
RBC: 5.09 MIL/uL (ref 3.87–5.11)
RDW: 13.7 % (ref 11.5–15.5)
WBC: 11.7 10*3/uL — ABNORMAL HIGH (ref 4.0–10.5)
nRBC: 0 % (ref 0.0–0.2)

## 2021-11-10 LAB — TROPONIN I (HIGH SENSITIVITY): Troponin I (High Sensitivity): 4 ng/L (ref <18)

## 2021-11-10 LAB — URINALYSIS, ROUTINE W REFLEX MICROSCOPIC
Bilirubin Urine: NEGATIVE
Glucose, UA: NEGATIVE mg/dL
Ketones, ur: NEGATIVE mg/dL
Leukocytes,Ua: NEGATIVE
Nitrite: NEGATIVE
Protein, ur: NEGATIVE mg/dL
Specific Gravity, Urine: 1.024 (ref 1.005–1.030)
pH: 5 (ref 5.0–8.0)

## 2021-11-10 LAB — POC URINE PREG, ED: Preg Test, Ur: NEGATIVE

## 2021-11-10 LAB — LIPASE, BLOOD: Lipase: 52 U/L — ABNORMAL HIGH (ref 11–51)

## 2021-11-10 MED ORDER — SODIUM CHLORIDE 0.9 % IV BOLUS: 1000.0000 mL | Freq: Once | INTRAVENOUS | Status: DC

## 2021-11-10 MED ORDER — FAMOTIDINE IN NACL 20-0.9 MG/50ML-% IV SOLN
20.0000 mg | Freq: Once | INTRAVENOUS | Status: AC
  Administered 2021-11-10: 20 mg via INTRAVENOUS
  Filled 2021-11-10: qty 50

## 2021-11-10 MED ORDER — IOHEXOL 350 MG/ML SOLN
100.0000 mL | Freq: Once | INTRAVENOUS | Status: AC | PRN
  Administered 2021-11-10: 100 mL via INTRAVENOUS

## 2021-11-10 MED ORDER — ONDANSETRON 4 MG PO TBDP
4.0000 mg | ORAL_TABLET | Freq: Three times a day (TID) | ORAL | 0 refills | Status: DC | PRN
  Filled 2021-11-10: qty 12, 4d supply, fill #0

## 2021-11-10 MED ORDER — POTASSIUM CHLORIDE CRYS ER 20 MEQ PO TBCR
40.0000 meq | EXTENDED_RELEASE_TABLET | Freq: Once | ORAL | Status: AC
  Administered 2021-11-10: 40 meq via ORAL
  Filled 2021-11-10: qty 2

## 2021-11-10 MED ORDER — ONDANSETRON HCL 4 MG/2ML IJ SOLN
4.0000 mg | Freq: Once | INTRAMUSCULAR | Status: AC
  Administered 2021-11-10: 4 mg via INTRAVENOUS
  Filled 2021-11-10: qty 2

## 2021-11-10 MED ORDER — POTASSIUM CHLORIDE 10 MEQ/100ML IV SOLN
10.0000 meq | Freq: Once | INTRAVENOUS | Status: DC
Start: 2021-11-10 — End: 2021-11-10
  Filled 2021-11-10: qty 100

## 2021-11-10 MED ORDER — FENTANYL CITRATE PF 50 MCG/ML IJ SOSY
50.0000 ug | PREFILLED_SYRINGE | Freq: Once | INTRAMUSCULAR | Status: AC
  Administered 2021-11-10: 50 ug via INTRAVENOUS
  Filled 2021-11-10: qty 1

## 2021-11-10 NOTE — ED Triage Notes (Signed)
Abd pain, sharp in nature,  started apx 2 hours ago. RUQ, under breast. Denies N/V/D. Denies fever. Started Saxenda apx 1 month ago.  ?

## 2021-11-10 NOTE — ED Provider Notes (Signed)
? ?[facility] ?Provider Note ? ? ? Event Date/Time  ? First MD Initiated Contact with Patient 11/10/21 0544   ?  (approximate) ? ? ?History  ? ?Abdominal Pain ? ? ?HPI ? ?Taesha Goodell Carlile is a 46 y.o. female who presents to the ED from home with a chief complaint of right-sided abdominal pain/pain underneath her right ribs which awakened her from sleep approximately 2 hours prior to arrival.  Patient reports right mid/upper quadrant abdominal pain, nonradiating, sharp in nature.  Denies fever, cough, chest pain, shortness of breath, nausea, vomiting or diarrhea.  Started Saxenda approximately 1 month ago for weight loss.  Had some dizziness issues which resolved with Dramamine and has not had side effects of nausea, vomiting or abdominal pain. ?  ? ? ?Past Medical History  ? ?Past Medical History:  ?Diagnosis Date  ? Allergy   ? Anxiety   ? Arthritis   ? PSORIATIC  ? Depression   ? Dysplastic nevus 07/27/2019  ? Right lateral bicep. Severe atypia, close to margin. Excised 11/14/2019, margins free.  ? Dysplastic nevus 07/30/2021  ? right mid back paraspinal, mod atypia  ? GERD (gastroesophageal reflux disease)   ? RARE-NO MEDS  ? History of kidney stones 2004  ? Hx of dysplastic nevus 07/16/2016  ? Right superior medial buttocks just lateral to superior crease. Mild atypia clost to deep margin  ? Hx of dysplastic nevus 07/12/2017  ? Left upper back sup. scapula. Severe atypia with focal fibrosis, close to lateral margin. Excised: 09/28/2017. Margins free  ? Hx of dysplastic nevus 07/12/2017  ? Left LQA periumbilical. Moderate atypia, irritated, close to lateral margin  ? Hx of dysplastic nevus 07/27/2019, exc 11/14/19  ? Right lateral bicep. Severe atypia, close to margin  ? Hypothyroidism   ? PONV (postoperative nausea and vomiting)   ? WITH KIDNEY STONE AND TONSILLECTOMY BUT HAD NO N/V WITH HER LAST FOOT SURGERY IN 2013  ? Psoriasis   ? Cosentyx  ? ? ? ?Active Problem List  ? ?Patient Active  Problem List  ? Diagnosis Date Noted  ? Recurrent major depressive disorder, in full remission ([place]) 01/14/2018  ? Abnormal blood chemistry 01/28/2015  ? Allergic rhinitis 01/28/2015  ? Anxiety 01/28/2015  ? Chest pain 01/28/2015  ? Family planning 01/28/2015  ? Acid reflux 01/28/2015  ? LBP (low back pain) 01/28/2015  ? Plantar fasciitis 01/28/2015  ? Borderline diabetes 01/28/2015  ? Psoriasis 01/28/2015  ? Avitaminosis D 01/28/2015  ? Disease of white blood cells 11/22/2009  ? Awareness of heartbeats 03/23/2008  ? Adult hypothyroidism 03/14/2008  ? History of tobacco use 11/30/2007  ? Calculus of kidney 11/23/2007  ? Arthritis with psoriasis ([place]) 12/03/2005  ? Clinical depression 03/04/2001  ? Morbidly obese ([place]) 08/10/1998  ? ? ? ?Past Surgical History  ? ?Past Surgical History:  ?Procedure Laterality Date  ? FOOT SURGERY Left   ? kidney stones removed  2004  ? TARSAL TUNNEL RELEASE Left 04/16/2017  ? Procedure: TARSAL TUNNEL RELEASE;  Surgeon: Samara Deist, DPM;  Location: [facility] ORS;  Service: Podiatry;  Laterality: Left;  ? TENDON REPAIR Left 04/16/2017  ? Procedure: FLEXOR TENDON REPAIR-SECONDARY ;  Surgeon: Samara Deist, DPM;  Location: [facility] ORS;  Service: Podiatry;  Laterality: Left;  ? tonsillectomy and adenoidectomy  1993  ? ? ? ?Home Medications  ? ?Prior to Admission medications   ?Medication Sig Start Date End Date Taking? Authorizing Provider  ?acetaminophen (TYLENOL) 500 MG tablet Take  500 mg by mouth every 6 (six) hours as needed. ?Patient not taking: Reported on 07/30/2021    [provider]  ?albuterol (VENTOLIN HFA) 108 (90 Base) MCG/ACT inhaler Inhale 2 inhalations into the lungs every 6 (six) hours as needed for Wheezing ?Patient not taking: Reported on 07/30/2021 05/13/21     ?ALPRAZolam (XANAX) 0.5 MG tablet TAKE 1 TABLET BY MOUTH AT BEDTIME AS NEEDED FOR ANXIETY. 09/09/21     ?Apple Cid Vn-Grn Tea-Bit Or-Cr (APPLE CIDER VINEGAR PLUS) TABS Take by mouth.    [provider]   ?azelastine (ASTELIN) 0.1 % nasal spray Place 1 spray into both nostrils 2 (two) times daily ?Patient not taking: Reported on 07/30/2021 05/13/21     ?Azelastine-Fluticasone (DYMISTA) 137-50 MCG/ACT SUSP Place one spray in each nostril twice a day ?Patient not taking: Reported on 07/30/2021 05/09/21     ?b complex vitamins tablet Take 1 tablet by mouth daily.    [provider]  ?benzonatate (TESSALON PERLES) 100 MG capsule Take 1 capsule by mouth 3 times a day 05/09/21     ?buPROPion (WELLBUTRIN XL) 300 MG 24 hr tablet Take 1 tablet (300 mg total) by mouth daily. 02/04/21   Chrismon, Vickki Muff, PA-C  ?buPROPion (WELLBUTRIN XL) 300 MG 24 hr tablet Take 1 tablet (300 mg total) by mouth once daily 09/08/21     ?buPROPion (WELLBUTRIN XL) 300 MG 24 hr tablet Take 1 tablet (300 mg total) by mouth daily. 09/09/21     ?cetirizine (ZYRTEC) 10 MG tablet Take 10 mg by mouth daily.     [provider]  ?chlorpheniramine-HYDROcodone (TUSSIONEX) 10-8 MG/5ML SUER Take 5 mLs by mouth every 12 (twelve) hours as needed for Cough ?Patient not taking: Reported on 07/30/2021 05/13/21     ?Cholecalciferol 1000 UNITS capsule Take 1,000 Units by mouth daily.     [provider]  ?clotrimazole-betamethasone (LOTRISONE) cream APPLY TO THE AFFECTED AREA(S) 2 TIMES DAILY. ?Patient not taking: Reported on 07/30/2021 02/06/21 02/06/22  Virginia Crews, MD  ?dicyclomine (BENTYL) 10 MG capsule Take 1 capsule (10 mg total) by mouth 4 (four) times daily -  before meals and at bedtime. ?Patient not taking: Reported on 11/07/2020 02/05/20   Mar Daring, PA-C  ?ergocalciferol (VITAMIN D2) 1.25 MG (50000 UT) capsule Take one capsule by mouth once a week 06/11/21     ?fluconazole (DIFLUCAN) 150 MG tablet Take 1 tablet (150 mg total) by mouth once for 1 dose May repeat x1 if symptoms recur.  Refill on prescription. ?Patient not taking: Reported on 07/30/2021 05/13/21     ?hydrochlorothiazide (HYDRODIURIL) 25 MG tablet Take 1  tablet (25 mg total) by mouth once daily 09/02/21     ?Insulin Pen Needle (UNIFINE PENTIPS) 31G X 8 MM MISC Use as directed with mounjaro injections 09/02/21     ?Ketoconazole 2 % GEL Apply 1 application topically daily. Cover the affected and immediate surrounding area for 2 weeks ?Patient not taking: Reported on 11/27/2020 10/27/19   McVey, Gelene Mink, PA-C  ?levothyroxine (SYNTHROID) 50 MCG tablet Take 1 tablet (50 mcg total) by mouth daily before breakfast. ?Patient not taking: Reported on 07/30/2021 02/04/21   Chrismon, Vickki Muff, PA-C  ?Liraglutide -Weight Management (SAXENDA) 18 MG/3ML SOPN Inject 3 mg subcutaneously once daily for 30 days 11/04/21     ?meloxicam (MOBIC) 15 MG tablet  03/31/18   [provider]  ?neomycin-polymyxin b-dexamethasone (MAXITROL) 3.5-10000-0.1 SUSP Instill 1 drop into both eyes three times a day ?Patient  not taking: Reported on 07/30/2021 06/16/21     ?predniSONE (DELTASONE) 20 MG tablet Take 2 tablets (40 mg total) by mouth once daily for 7 days ?Patient not taking: Reported on 07/30/2021 05/13/21     ?Semaglutide,0.25 or 0.'5MG'$ /DOS, (OZEMPIC, 0.25 OR 0.5 MG/DOSE,) 2 MG/1.5ML SOPN Inject 0.1875 mLs (0.25 mg total) subcutaneously once a week for 30 days 09/12/21     ?sulfaSALAzine (AZULFIDINE) 500 MG tablet Take 500 mg by mouth 2 (two) times daily.    [provider]  ?sulfaSALAzine (AZULFIDINE) 500 MG tablet TAKE 1 TABLET BY MOUTH 2 TIMES DAILY ?Patient not taking: Reported on 11/27/2020 09/12/20 10/08/21  Emmaline Kluver., MD  ?Samella Parr [facility]) 1 % CREA Apply 1 application topically daily. Apply to aa's psoriasis QD PRN. 07/30/21   Ralene Bathe, MD  ?tirzepatide [facility]) 2.5 MG/0.5ML Pen Inject 2.5 mg subcutaneously every 7 (seven) days for 30 days 09/02/21     ?Vitamin D, Ergocalciferol, (DRISDOL) 1.25 MG (50000 UNIT) CAPS capsule Take 1 capsule (50,000 Units total) by mouth every 7 (seven) days. 11/28/20   Flinchum, Kelby Aline, FNP  ? ? ? ?Allergies   ?Patient has no known allergies. ? ? ?Family History  ? ?Family History  ?Problem Relation Age of Onset  ? Depression Mother   ? Hyperlipidemia Mother   ? Hypertension Mother   ? Healthy Brother   ? Heart disease [place]

## 2021-11-10 NOTE — ED Provider Notes (Signed)
----------------------------------------- ?  7:01 AM on 11/10/2021 ?----------------------------------------- ? ?Blood pressure 127/74, pulse 91, temperature (!) 97.5 ?F (36.4 ?C), temperature source Oral, resp. rate (!) 21, height '5\' 3"'$  (1.6 m), weight 130.6 kg, SpO2 98 %. ? ?Assuming care from Dr. Beather Arbour.  In short, Kelsey Randolph is a 46 y.o. female with a chief complaint of Abdominal Pain ?Marland Kitchen  Refer to the original H&P for additional details. ? ?The current plan of care is to follow-up CT imaging and troponin, if negative then plan for discharge home. ? ?----------------------------------------- ?8:08 AM on 11/10/2021 ?----------------------------------------- ? ?CTA chest is negative for PE or other acute process, CT of abdomen/pelvis is also unremarkable.  Troponin is within normal limits and there is no apparent emergent cause for patient's upper abdominal pain at this time.  Gastritis is possible and she is appropriate for discharge home with outpatient follow-up, will prescribe Zofran and was counseled to trial antacids.  She was counseled to return to the ED for new worsening symptoms, patient agrees with plan. ? ?  ?Blake Divine, MD ?11/10/21 [phone] ? ?

## 2021-11-11 ENCOUNTER — Other Ambulatory Visit: Payer: Self-pay

## 2021-11-11 MED ORDER — POTASSIUM CHLORIDE ER 10 MEQ PO TBCR
EXTENDED_RELEASE_TABLET | ORAL | 11 refills | Status: DC
  Filled 2021-11-11: qty 30, 30d supply, fill #0
  Filled 2021-12-15: qty 30, 30d supply, fill #1
  Filled 2022-01-22: qty 30, 30d supply, fill #2
  Filled 2022-02-16: qty 30, 30d supply, fill #3
  Filled 2022-04-16: qty 30, 30d supply, fill #4
  Filled 2022-05-14: qty 30, 30d supply, fill #5
  Filled 2022-06-11: qty 30, 30d supply, fill #6
  Filled 2022-07-13: qty 30, 30d supply, fill #7
  Filled 2022-10-01: qty 30, 30d supply, fill #8

## 2021-11-11 MED ORDER — VALACYCLOVIR HCL 1 G PO TABS
1000.0000 mg | ORAL_TABLET | Freq: Two times a day (BID) | ORAL | 11 refills | Status: DC
  Filled 2021-11-11: qty 60, 30d supply, fill #0

## 2021-11-11 MED ORDER — CYCLOBENZAPRINE HCL 10 MG PO TABS
ORAL_TABLET | ORAL | 0 refills | Status: DC
  Filled 2021-11-11: qty 30, 10d supply, fill #0

## 2021-11-20 ENCOUNTER — Ambulatory Visit: Payer: BC Managed Care – PPO | Admitting: Urology

## 2021-11-20 ENCOUNTER — Ambulatory Visit
Admission: RE | Admit: 2021-11-20 | Discharge: 2021-11-20 | Disposition: A | Discharge status: Home / Self Care | Payer: BC Managed Care – PPO | Source: Ambulatory Visit | Attending: Urology | Admitting: Urology

## 2021-11-20 ENCOUNTER — Encounter: Payer: Self-pay | Admitting: Urology

## 2021-11-20 VITALS — BP 130/80 | HR 74 | Ht 63.0 in | Wt 288.0 lb

## 2021-11-20 DIAGNOSIS — N200 Calculus of kidney: Secondary | ICD-10-CM | POA: Insufficient documentation

## 2021-11-20 NOTE — Progress Notes (Signed)
? ?11/20/2021 ?1:05 PM  ? ?Kelsey Randolph ?Oct 29, 1975 ?[id number] ? ?Referring provider: Mortimer Fries, PA-C ?[facility] ?[place],  [place] [address] ? ?Chief Complaint  ?Patient presents with  ? Nephrolithiasis  ? ? ?HPI: ?Kelsey Randolph is a 46 y.o. female referred for evaluation of nephrolithiasis. ? ?[facility] ED visit 11/10/2021 for right upper quadrant abdominal pain ?CT abdomen/pelvis performed which showed nonobstructing left renal calculi ?She states the calculi were not mentioned in the ED and she saw the results on MyChart and was referred by her PCP for further evaluation ?Prior stone 2004 and underwent ureteroscopic removal by Dr. Jacqlyn Larsen ?Has occasional dull left back pain but no renal colic ?No bothersome LUTS or gross hematuria ? ? ?PMH: ?Past Medical History:  ?Diagnosis Date  ? Allergy   ? Anxiety   ? Arthritis   ? PSORIATIC  ? Depression   ? Dysplastic nevus 07/27/2019  ? Right lateral bicep. Severe atypia, close to margin. Excised 11/14/2019, margins free.  ? Dysplastic nevus 07/30/2021  ? right mid back paraspinal, mod atypia  ? GERD (gastroesophageal reflux disease)   ? RARE-NO MEDS  ? History of kidney stones 2004  ? Hx of dysplastic nevus 07/16/2016  ? Right superior medial buttocks just lateral to superior crease. Mild atypia clost to deep margin  ? Hx of dysplastic nevus 07/12/2017  ? Left upper back sup. scapula. Severe atypia with focal fibrosis, close to lateral margin. Excised: 09/28/2017. Margins free  ? Hx of dysplastic nevus 07/12/2017  ? Left LQA periumbilical. Moderate atypia, irritated, close to lateral margin  ? Hx of dysplastic nevus 07/27/2019, exc 11/14/19  ? Right lateral bicep. Severe atypia, close to margin  ? Hypothyroidism   ? PONV (postoperative nausea and vomiting)   ? WITH KIDNEY STONE AND TONSILLECTOMY BUT HAD NO N/V WITH HER LAST FOOT SURGERY IN 2013  ? Psoriasis   ? Cosentyx  ? ? ?Surgical History: ?Past Surgical History:  ?Procedure Laterality  Date  ? FOOT SURGERY Left   ? kidney stones removed  2004  ? TARSAL TUNNEL RELEASE Left 04/16/2017  ? Procedure: TARSAL TUNNEL RELEASE;  Surgeon: Samara Deist, DPM;  Location: [facility] ORS;  Service: Podiatry;  Laterality: Left;  ? TENDON REPAIR Left 04/16/2017  ? Procedure: FLEXOR TENDON REPAIR-SECONDARY ;  Surgeon: Samara Deist, DPM;  Location: [facility] ORS;  Service: Podiatry;  Laterality: Left;  ? tonsillectomy and adenoidectomy  1993  ? ? ?Home Medications:  ?Allergies as of 11/20/2021   ?No Known Allergies ?  ? ?  ?Medication List  ?  ? ?  ? Accurate as of November 20, 2021  1:05 PM. If you have any questions, ask your nurse or doctor.  ?  ?  ? ?  ? ?acetaminophen 500 MG tablet ?Commonly known as: TYLENOL ?Take 500 mg by mouth every 6 (six) hours as needed. ?  ?ALPRAZolam 0.5 MG tablet ?Commonly known as: Duanne Moron ?TAKE 1 TABLET BY MOUTH AT BEDTIME AS NEEDED FOR ANXIETY. ?  ?Apple Cider Vinegar Plus Tabs ?Take by mouth. ?  ?Azelastine HCl 137 MCG/SPRAY Soln ?Place 1 spray into both nostrils 2 (two) times daily ?  ?Azelastine-Fluticasone 137-50 MCG/ACT Susp ?Commonly known as: Dymista ?Place one spray in each nostril twice a day ?  ?b complex vitamins tablet ?Take 1 tablet by mouth daily. ?  ?benzonatate 100 MG capsule ?Commonly known as: [place] ?Take 1 capsule by mouth 3 times a day ?  ?buPROPion 300 MG  24 hr tablet ?Commonly known as: Wellbutrin XL ?Take 1 tablet (300 mg total) by mouth daily. ?  ?buPROPion 300 MG 24 hr tablet ?Commonly known as: WELLBUTRIN XL ?Take 1 tablet (300 mg total) by mouth once daily ?  ?buPROPion 300 MG 24 hr tablet ?Commonly known as: WELLBUTRIN XL ?Take 1 tablet (300 mg total) by mouth daily. ?  ?cetirizine 10 MG tablet ?Commonly known as: ZYRTEC ?Take 10 mg by mouth daily. ?  ?chlorpheniramine-HYDROcodone 10-8 MG/5ML Suer ?Commonly known as: [place] ?Take 5 mLs by mouth every 12 (twelve) hours as needed for Cough ?  ?Cholecalciferol 25 MCG (1000 UT) capsule ?Take 1,000 Units by mouth  daily. ?  ?clotrimazole-betamethasone cream ?Commonly known as: LOTRISONE ?APPLY TO THE AFFECTED AREA(S) 2 TIMES DAILY. ?  ?cyclobenzaprine 10 MG tablet ?Commonly known as: FLEXERIL ?Take 1 tablet (10 mg total) by mouth 3 (three) times daily as needed for muscle spasms for up to 10 days ?  ?dicyclomine 10 MG capsule ?Commonly known as: Bentyl ?Take 1 capsule (10 mg total) by mouth 4 (four) times daily -  before meals and at bedtime. ?  ?fluconazole 150 MG tablet ?Commonly known as: DIFLUCAN ?Take 1 tablet (150 mg total) by mouth once for 1 dose. May repeat if symptoms recur.  Refill on prescription. ?(Take 1 tablet (150 mg total) by mouth once for 1 dose May repeat x1 if symptoms recur.  Refill on prescription.) ?  ?hydrochlorothiazide 25 MG tablet ?Commonly known as: HYDRODIURIL ?Take 1 tablet (25 mg total) by mouth once daily ?  ?Ketoconazole 2 % Gel ?Apply 1 application topically daily. Cover the affected and immediate surrounding area for 2 weeks ?  ?levothyroxine 50 MCG tablet ?Commonly known as: SYNTHROID ?Take 1 tablet (50 mcg total) by mouth daily before breakfast. ?  ?meloxicam 15 MG tablet ?Commonly known as: MOBIC ?  ?Mounjaro 2.5 MG/0.5ML Pen ?Generic drug: tirzepatide ?Inject 2.5 mg under the skin every 7 (seven) days ?(Inject 2.5 mg subcutaneously every 7 (seven) days for 30 days) ?  ?neomycin-polymyxin b-dexamethasone 3.5-10000-0.1 Susp ?Commonly known as: MAXITROL ?Instill 1 drop into both eyes three times a day ?  ?ondansetron 4 MG disintegrating tablet ?Commonly known as: ZOFRAN-ODT ?Take 1 tablet (4 mg total) by mouth every 8 (eight) hours as needed for nausea or vomiting. ?  ?Ozempic (0.25 or 0.5 MG/DOSE) 2 MG/1.5ML Sopn ?Generic drug: Semaglutide(0.25 or 0.'5MG'$ /DOS) ?Inject 0.25 mg into the skin once a week for 30 days ?(Inject 0.1875 mLs (0.25 mg total) subcutaneously once a week for 30 days) ?  ?potassium chloride 10 MEQ tablet ?Commonly known as: KLOR-CON ?Take 1 tablet (10 mEq total) by mouth  once daily ?  ?predniSONE 20 MG tablet ?Commonly known as: DELTASONE ?Take 2 tablets (40 mg total) by mouth once daily for 7 days ?  ?Saxenda 18 MG/3ML Sopn ?Generic drug: Liraglutide -Weight Management ?Inject 3 mg under the skin once daily ?(Inject 3 mg subcutaneously once daily for 30 days) ?  ?sulfaSALAzine 500 MG tablet ?Commonly known as: AZULFIDINE ?Take 500 mg by mouth 2 (two) times daily. ?  ?sulfaSALAzine 500 MG tablet ?Commonly known as: AZULFIDINE ?TAKE 1 TABLET BY MOUTH 2 TIMES DAILY ?  ?Unifine Pentips 31G X 8 MM Misc ?Generic drug: Insulin Pen Needle ?Use as directed with mounjaro injections ?  ?valACYclovir 1000 MG tablet ?Commonly known as: VALTREX ?Take 1 tablet (1,000 mg total) by mouth 2 (two) times daily ?  ?Ventolin HFA 108 (90 Base) MCG/ACT inhaler ?Generic drug:  albuterol ?Inhale 2 puffs into the lungs every 6 (six) hours as needed for wheezing ?(Inhale 2 inhalations into the lungs every 6 (six) hours as needed for Wheezing) ?  ?Vitamin D (Ergocalciferol) 1.25 MG (50000 UNIT) Caps capsule ?Commonly known as: DRISDOL ?Take 1 capsule (50,000 Units total) by mouth every 7 (seven) days. ?  ?Vitamin D (Ergocalciferol) 1.25 MG (50000 UNIT) Caps capsule ?Commonly known as: DRISDOL ?Take one capsule by mouth once a week ?  ?Vtama 1 % Crea ?Generic drug: Tapinarof ?Apply 1 application topically daily. Apply to aa's psoriasis QD PRN. ?  ? ?  ? ? ?Allergies: No Known Allergies ? ?Family History: ?Family History  ?Problem Relation Age of Onset  ? Depression Mother   ? Hyperlipidemia Mother   ? Hypertension Mother   ? Healthy Brother   ? Heart disease Paternal Grandfather   ?     MI; CAD  ? Heart disease Father   ? Dementia Maternal Grandmother   ? Diabetes Maternal Grandmother   ? Breast cancer Neg Hx   ? ? ?Social History:  reports that she quit smoking about 6 years ago. Her smoking use included cigarettes. She has a 10.00 pack-year smoking history. She has never used smokeless tobacco. She reports  current alcohol use. She reports that she does not use drugs. ? ? ?Physical Exam: ?BP 130/80   Pulse 74   Ht '5\' 3"'$  (1.6 m)   Wt 288 lb (130.6 kg)   BMI 51.02 kg/m?   ?Constitutional:  Alert and orient

## 2021-11-21 ENCOUNTER — Telehealth: Payer: Self-pay | Admitting: Urology

## 2021-11-21 ENCOUNTER — Encounter: Payer: Self-pay | Admitting: Urology

## 2021-11-21 LAB — MICROSCOPIC EXAMINATION

## 2021-11-21 LAB — URINALYSIS, COMPLETE
Bilirubin, UA: NEGATIVE
Glucose, UA: NEGATIVE
Leukocytes,UA: NEGATIVE
Nitrite, UA: NEGATIVE
Specific Gravity, UA: 1.025 (ref 1.005–1.030)
Urobilinogen, Ur: 0.2 mg/dL (ref 0.2–1.0)
pH, UA: 6 (ref 5.0–7.5)

## 2021-11-21 NOTE — Telephone Encounter (Signed)
Talked to patient and advised patient we will give her a call after he reviews the results. ?

## 2021-11-21 NOTE — Telephone Encounter (Signed)
Patient was seen in the clinic on 4/13 by Dr. Bernardo Heater.  She saw the urine test results in mychart and would like to discuss with Dr. Dene Gentry nurse. ? ?Also, looking for x-ray results from yesterday. ? ?Please advise. ?

## 2021-11-22 ENCOUNTER — Encounter: Payer: Self-pay | Admitting: Urology

## 2021-11-23 ENCOUNTER — Telehealth: Payer: Self-pay | Admitting: Urology

## 2021-11-23 DIAGNOSIS — R3129 Other microscopic hematuria: Secondary | ICD-10-CM

## 2021-11-23 DIAGNOSIS — N200 Calculus of kidney: Secondary | ICD-10-CM

## 2021-11-23 NOTE — Telephone Encounter (Signed)
Urinalysis did show an abnormal amount of microscopic blood.  Would recommend scheduling CT urogram to assess for obstruction/other causes of blood in the urine.  Order was entered and will call with results. ?

## 2021-11-24 ENCOUNTER — Telehealth: Payer: Self-pay

## 2021-11-24 NOTE — Telephone Encounter (Signed)
Pt LM on triage line stating that she has not heard anything from radiology in regards to scheduling her CT scan. Called pt back to offer number for scheduling. No answer.  ?

## 2021-11-24 NOTE — Telephone Encounter (Signed)
Patient notified and voiced understanding. She will wait on Imaging to call.  ?

## 2021-11-25 ENCOUNTER — Encounter: Payer: Self-pay | Admitting: Physician Assistant

## 2021-11-25 ENCOUNTER — Ambulatory Visit: Payer: BC Managed Care – PPO | Admitting: Physician Assistant

## 2021-11-25 VITALS — BP 146/78 | HR 70 | Temp 97.9°F | Ht 63.0 in | Wt 285.0 lb

## 2021-11-25 DIAGNOSIS — N200 Calculus of kidney: Secondary | ICD-10-CM | POA: Diagnosis not present

## 2021-11-25 LAB — URINALYSIS, COMPLETE
Bilirubin, UA: NEGATIVE
Glucose, UA: NEGATIVE
Ketones, UA: NEGATIVE
Leukocytes,UA: NEGATIVE
Nitrite, UA: NEGATIVE
Protein,UA: NEGATIVE
RBC, UA: NEGATIVE
Specific Gravity, UA: <1.005 — ABNORMAL LOW (ref 1.005–1.030)
Urobilinogen, Ur: 0.2 mg/dL (ref 0.2–1.0)
pH, UA: 6.5 (ref 5.0–7.5)

## 2021-11-25 LAB — MICROSCOPIC EXAMINATION

## 2021-11-25 NOTE — Progress Notes (Signed)
? ?11/25/2021 ?4:24 PM  ? ?Kelsey Randolph ?Aug 14, 1975 ?[id number] ? ?CC: ?Chief Complaint  ?Patient presents with  ? Nephrolithiasis  ? ?HPI: ?Kelsey Randolph is a 46 y.o. female with PMH nephrolithiasis with recent CT findings of nonobstructing left renal calculi who presents today for evaluation of possible UTI.  ? ?She was seen by Dr. Bernardo Heater in clinic 5 days ago, at which point her UA was notable for microscopic hematuria and bacteriuria.  She is scheduled for CT urogram in 3 days.  Today she reports generalized symptoms including "feeling off" and subjective fever.  She is also reporting constant left flank pain without gross hematuria, nausea, or vomiting.  She reports the pain is improved by heating pad or sitting up straight with good back support.  She is concerned she may have a urinary tract infection. ? ?In-office UA and microscopy today pan negative. ? ?PMH: ?Past Medical History:  ?Diagnosis Date  ? Allergy   ? Anxiety   ? Arthritis   ? PSORIATIC  ? Depression   ? Dysplastic nevus 07/27/2019  ? Right lateral bicep. Severe atypia, close to margin. Excised 11/14/2019, margins free.  ? Dysplastic nevus 07/30/2021  ? right mid back paraspinal, mod atypia  ? GERD (gastroesophageal reflux disease)   ? RARE-NO MEDS  ? History of kidney stones 2004  ? Hx of dysplastic nevus 07/16/2016  ? Right superior medial buttocks just lateral to superior crease. Mild atypia clost to deep margin  ? Hx of dysplastic nevus 07/12/2017  ? Left upper back sup. scapula. Severe atypia with focal fibrosis, close to lateral margin. Excised: 09/28/2017. Margins free  ? Hx of dysplastic nevus 07/12/2017  ? Left LQA periumbilical. Moderate atypia, irritated, close to lateral margin  ? Hx of dysplastic nevus 07/27/2019, exc 11/14/19  ? Right lateral bicep. Severe atypia, close to margin  ? Hypothyroidism   ? PONV (postoperative nausea and vomiting)   ? WITH KIDNEY STONE AND TONSILLECTOMY BUT HAD NO N/V WITH HER LAST FOOT SURGERY IN 2013  ?  Psoriasis   ? Cosentyx  ? ? ?Surgical History: ?Past Surgical History:  ?Procedure Laterality Date  ? FOOT SURGERY Left   ? kidney stones removed  2004  ? TARSAL TUNNEL RELEASE Left 04/16/2017  ? Procedure: TARSAL TUNNEL RELEASE;  Surgeon: Samara Deist, DPM;  Location: [facility];  Service: Podiatry;  Laterality: Left;  ? TENDON REPAIR Left 04/16/2017  ? Procedure: FLEXOR TENDON REPAIR-SECONDARY ;  Surgeon: Samara Deist, DPM;  Location: [facility];  Service: Podiatry;  Laterality: Left;  ? tonsillectomy and adenoidectomy  1993  ? ? ?Home Medications:  ?Allergies as of 11/25/2021   ?No Known Allergies ?  ? ?  ?Medication List  ?  ? ?  ? Accurate as of November 25, 2021  4:24 PM. If you have any questions, ask your nurse or doctor.  ?  ?  ? ?  ? ?STOP taking these medications   ? ?Azelastine HCl 137 MCG/SPRAY Soln ?Stopped by: Debroah Loop, PA-C ?  ?Azelastine-Fluticasone 137-50 MCG/ACT Susp ?Commonly known as: Dymista ?Stopped by: Debroah Loop, PA-C ?  ?benzonatate 100 MG capsule ?Commonly known as: Best boy ?Stopped by: Debroah Loop, PA-C ?  ?chlorpheniramine-HYDROcodone 10-8 MG/5ML Suer ?Commonly known as: Woodson ?Stopped by: Debroah Loop, PA-C ?  ?Cholecalciferol 25 MCG (1000 UT) capsule ?Stopped by: Debroah Loop, PA-C ?  ?meloxicam 15 MG tablet ?Commonly known as: MOBIC ?Stopped by: Debroah Loop, PA-C ?  ?Mounjaro 2.5 MG/0.5ML Pen ?Generic drug:  tirzepatide ?Stopped by: Debroah Loop, PA-C ?  ?neomycin-polymyxin b-dexamethasone 3.5-10000-0.1 Susp ?Commonly known as: MAXITROL ?Stopped by: Debroah Loop, PA-C ?  ?Ozempic (0.25 or 0.5 MG/DOSE) 2 MG/1.5ML Sopn ?Generic drug: Semaglutide(0.25 or 0.'5MG'$ /DOS) ?Stopped by: Debroah Loop, PA-C ?  ?predniSONE 20 MG tablet ?Commonly known as: DELTASONE ?Stopped by: Debroah Loop, PA-C ?  ?valACYclovir 1000 MG tablet ?Commonly known as: VALTREX ?Stopped by: Debroah Loop, PA-C ?   ?Ventolin HFA 108 (90 Base) MCG/ACT inhaler ?Generic drug: albuterol ?Stopped by: Debroah Loop, PA-C ?  ? ?  ? ?TAKE these medications   ? ?acetaminophen 500 MG tablet ?Commonly known as: TYLENOL ?Take 500 mg by mouth every 6 (six) hours as needed. ?  ?ALPRAZolam 0.5 MG tablet ?Commonly known as: Duanne Moron ?TAKE 1 TABLET BY MOUTH AT BEDTIME AS NEEDED FOR ANXIETY. ?  ?Apple Cider Vinegar Plus Tabs ?Take by mouth. ?  ?b complex vitamins tablet ?Take 1 tablet by mouth daily. ?  ?buPROPion 300 MG 24 hr tablet ?Commonly known as: Wellbutrin XL ?Take 1 tablet (300 mg total) by mouth daily. ?  ?buPROPion 300 MG 24 hr tablet ?Commonly known as: WELLBUTRIN XL ?Take 1 tablet (300 mg total) by mouth once daily ?  ?buPROPion 300 MG 24 hr tablet ?Commonly known as: WELLBUTRIN XL ?Take 1 tablet (300 mg total) by mouth daily. ?  ?cetirizine 10 MG tablet ?Commonly known as: ZYRTEC ?Take 10 mg by mouth daily. ?  ?clotrimazole-betamethasone cream ?Commonly known as: LOTRISONE ?APPLY TO THE AFFECTED AREA(S) 2 TIMES DAILY. ?  ?cyclobenzaprine 10 MG tablet ?Commonly known as: FLEXERIL ?Take 1 tablet (10 mg total) by mouth 3 (three) times daily as needed for muscle spasms for up to 10 days ?  ?dicyclomine 10 MG capsule ?Commonly known as: Bentyl ?Take 1 capsule (10 mg total) by mouth 4 (four) times daily -  before meals and at bedtime. ?  ?fluconazole 150 MG tablet ?Commonly known as: DIFLUCAN ?Take 1 tablet (150 mg total) by mouth once for 1 dose. May repeat if symptoms recur.  Refill on prescription. ?(Take 1 tablet (150 mg total) by mouth once for 1 dose May repeat x1 if symptoms recur.  Refill on prescription.) ?  ?hydrochlorothiazide 25 MG tablet ?Commonly known as: HYDRODIURIL ?Take 1 tablet (25 mg total) by mouth once daily ?  ?Ketoconazole 2 % Gel ?Apply 1 application topically daily. Cover the affected and immediate surrounding area for 2 weeks ?  ?levothyroxine 50 MCG tablet ?Commonly known as: SYNTHROID ?Take 1 tablet  (50 mcg total) by mouth daily before breakfast. ?  ?ondansetron 4 MG disintegrating tablet ?Commonly known as: ZOFRAN-ODT ?Take 1 tablet (4 mg total) by mouth every 8 (eight) hours as needed for nausea or vomiting. ?  ?potassium chloride 10 MEQ tablet ?Commonly known as: KLOR-CON ?Take 1 tablet (10 mEq total) by mouth once daily ?  ?Saxenda 18 MG/3ML Sopn ?Generic drug: Liraglutide -Weight Management ?Inject 3 mg under the skin once daily ?(Inject 3 mg subcutaneously once daily for 30 days) ?  ?sulfaSALAzine 500 MG tablet ?Commonly known as: AZULFIDINE ?Take 500 mg by mouth 2 (two) times daily. ?  ?sulfaSALAzine 500 MG tablet ?Commonly known as: AZULFIDINE ?TAKE 1 TABLET BY MOUTH 2 TIMES DAILY ?  ?Unifine Pentips 31G X 8 MM Misc ?Generic drug: Insulin Pen Needle ?Use as directed with mounjaro injections ?  ?Vitamin D (Ergocalciferol) 1.25 MG (50000 UNIT) Caps capsule ?Commonly known as: DRISDOL ?Take 1 capsule (50,000 Units total) by mouth every 7 (seven)  days. ?  ?Vitamin D (Ergocalciferol) 1.25 MG (50000 UNIT) Caps capsule ?Commonly known as: DRISDOL ?Take one capsule by mouth once a week ?  ?Vtama 1 % Crea ?Generic drug: Tapinarof ?Apply 1 application topically daily. Apply to aa's psoriasis QD PRN. ?  ? ?  ? ? ?Allergies:  ?No Known Allergies ? ?Family History: ?Family History  ?Problem Relation Age of Onset  ? Depression Mother   ? Hyperlipidemia Mother   ? Hypertension Mother   ? Healthy Brother   ? Heart disease Paternal Grandfather   ?     MI; CAD  ? Heart disease Father   ? Dementia Maternal Grandmother   ? Diabetes Maternal Grandmother   ? Breast cancer Neg Hx   ? ? ?Social History:  ? reports that she quit smoking about 6 years ago. Her smoking use included cigarettes. She has a 10.00 pack-year smoking history. She has never used smokeless tobacco. She reports current alcohol use. She reports that she does not use drugs. ? ?Physical Exam: ?BP (!) 146/78   Pulse 70   Temp 97.9 ?F (36.6 ?C) (Oral)   Ht  '5\' 3"'$  (1.6 m)   Wt 285 lb (129.3 kg)   BMI 50.49 kg/m?   ?Constitutional:  Alert and oriented, no acute distress, nontoxic appearing ?HEENT: [place], AT ?Cardiovascular: No clubbing, cyanosis, or edema ?Respirator

## 2021-11-28 ENCOUNTER — Ambulatory Visit
Admission: RE | Admit: 2021-11-28 | Discharge: 2021-11-28 | Disposition: A | Discharge status: Home / Self Care | Payer: BC Managed Care – PPO | Source: Ambulatory Visit | Attending: Urology | Admitting: Urology

## 2021-11-28 DIAGNOSIS — N200 Calculus of kidney: Secondary | ICD-10-CM | POA: Diagnosis present

## 2021-11-28 DIAGNOSIS — R3129 Other microscopic hematuria: Secondary | ICD-10-CM | POA: Diagnosis present

## 2021-11-28 MED ORDER — IOHEXOL 300 MG/ML  SOLN
150.0000 mL | Freq: Once | INTRAMUSCULAR | Status: AC | PRN
  Administered 2021-11-28: 150 mL via INTRAVENOUS

## 2021-12-02 ENCOUNTER — Telehealth: Payer: Self-pay | Admitting: *Deleted

## 2021-12-02 NOTE — Telephone Encounter (Signed)
Notified patient as instructed, patient pleased °

## 2021-12-02 NOTE — Telephone Encounter (Signed)
-----   Message from Nori Riis, PA-C sent at 12/02/2021  9:05 AM EDT ----- ?Please let Mrs. Kelsey Randolph know that the CT urogram demonstrates that she does have left-sided kidney stones, but they are not resulting in any blockage of the kidney.  Because of this, they are likely not causing the left-sided flank pain.  She does not need to be scheduled for a cystoscopy with Dr. Bernardo Heater to complete her microscopic hematuria work-up.  ?

## 2021-12-15 ENCOUNTER — Other Ambulatory Visit: Payer: Self-pay

## 2021-12-16 ENCOUNTER — Other Ambulatory Visit: Payer: Self-pay

## 2021-12-22 ENCOUNTER — Ambulatory Visit: Payer: BC Managed Care – PPO | Admitting: Urology

## 2021-12-22 ENCOUNTER — Encounter: Payer: Self-pay | Admitting: Urology

## 2021-12-22 VITALS — BP 137/87 | HR 133 | Ht 63.0 in | Wt 285.0 lb

## 2021-12-22 DIAGNOSIS — R3129 Other microscopic hematuria: Secondary | ICD-10-CM | POA: Diagnosis not present

## 2021-12-22 DIAGNOSIS — N200 Calculus of kidney: Secondary | ICD-10-CM | POA: Diagnosis not present

## 2021-12-22 LAB — URINALYSIS, COMPLETE
Bilirubin, UA: NEGATIVE
Glucose, UA: NEGATIVE
Leukocytes,UA: NEGATIVE
Nitrite, UA: NEGATIVE
Specific Gravity, UA: >1.03 — ABNORMAL HIGH (ref 1.005–1.030)
Urobilinogen, Ur: 0.2 mg/dL (ref 0.2–1.0)
pH, UA: 6 (ref 5.0–7.5)

## 2021-12-22 LAB — MICROSCOPIC EXAMINATION: Epithelial Cells (non renal): >10 /hpf — AB (ref 0–10)

## 2021-12-22 NOTE — Progress Notes (Signed)
? ?  12/22/21 ? ?CC:  ?Chief Complaint  ?Patient presents with  ? Cysto  ? ? ?HPI: See Sam Vaillancourt's note of 11/25/2021.  Back pain significant improved with heat and posture changes and she states today it was musculoskeletal pain.  Cystoscopy was recommended to complete microhematuria evaluation ? ?Blood pressure 137/87, pulse (!) 133, height '5\' 3"'$  (1.6 m), weight 285 lb (129.3 kg). ?NED. A&Ox3.   ?No respiratory distress   ?Abd soft, NT, ND ?Normal external genitalia with patent urethral meatus ? ?Cystoscopy Procedure Note ? ?Patient identification was confirmed, informed consent was obtained, and patient was prepped using Betadine solution.  Lidocaine jelly was administered per urethral meatus.   ? ?Procedure: ?- Flexible cystoscope introduced, without any difficulty.   ?- Thorough search of the bladder revealed: ?   normal urethral meatus ?   normal urothelium ?   no stones ?   no ulcers  ?   no tumors ?   no urethral polyps ?   no trabeculation ? ?- Ureteral orifices were normal in position and appearance. ? ?Post-Procedure: ?- Patient tolerated the procedure well ? ?Assessment/ Plan: ?No mucosal abnormalities noted on cystoscopy ?Microhematuria most likely secondary to left renal calculus ?She is asymptomatic.  On review of delayed images there appears to be some calyceal dilation which may be chronic ?Follow-up 6 months with KUB and instructed to call earlier for worsening flank pain ? ? ? ?Abbie Sons, MD ? ?

## 2021-12-23 ENCOUNTER — Other Ambulatory Visit (HOSPITAL_BASED_OUTPATIENT_CLINIC_OR_DEPARTMENT_OTHER): Payer: Self-pay

## 2021-12-23 ENCOUNTER — Other Ambulatory Visit (HOSPITAL_COMMUNITY): Payer: Self-pay

## 2021-12-23 ENCOUNTER — Other Ambulatory Visit: Payer: Self-pay

## 2022-01-06 ENCOUNTER — Other Ambulatory Visit: Payer: Self-pay

## 2022-01-06 MED ORDER — VALACYCLOVIR HCL 1 G PO TABS
ORAL_TABLET | ORAL | 0 refills | Status: DC
  Filled 2022-01-06: qty 30, 10d supply, fill #0

## 2022-01-06 MED ORDER — TRIAMCINOLONE ACETONIDE 0.1 % EX OINT
TOPICAL_OINTMENT | Freq: Two times a day (BID) | CUTANEOUS | 0 refills | Status: DC
Start: 2022-01-06 — End: 2022-01-09
  Filled 2022-01-06: qty 30, 15d supply, fill #0

## 2022-01-08 ENCOUNTER — Other Ambulatory Visit: Payer: Self-pay

## 2022-01-09 ENCOUNTER — Other Ambulatory Visit: Payer: Self-pay

## 2022-01-09 MED ORDER — PREDNISONE 20 MG PO TABS
ORAL_TABLET | ORAL | 0 refills | Status: DC
  Filled 2022-01-09: qty 10, 5d supply, fill #0

## 2022-01-09 MED ORDER — TRIAMCINOLONE ACETONIDE 0.1 % EX OINT
TOPICAL_OINTMENT | Freq: Two times a day (BID) | CUTANEOUS | 0 refills | Status: DC
  Filled 2022-01-09: qty 30, 15d supply, fill #0
  Filled 2022-01-19: qty 30, 30d supply, fill #0

## 2022-01-11 IMAGING — MG DIGITAL SCREENING BILAT W/ TOMO W/ CAD
8 of 14 series · 8 of 40 positions shown · non-contrast
Comparison: Previous exam(s).

CLINICAL DATA: Screening.

EXAM:
DIGITAL SCREENING BILATERAL MAMMOGRAM WITH TOMO AND CAD

[R CC synth-2D]
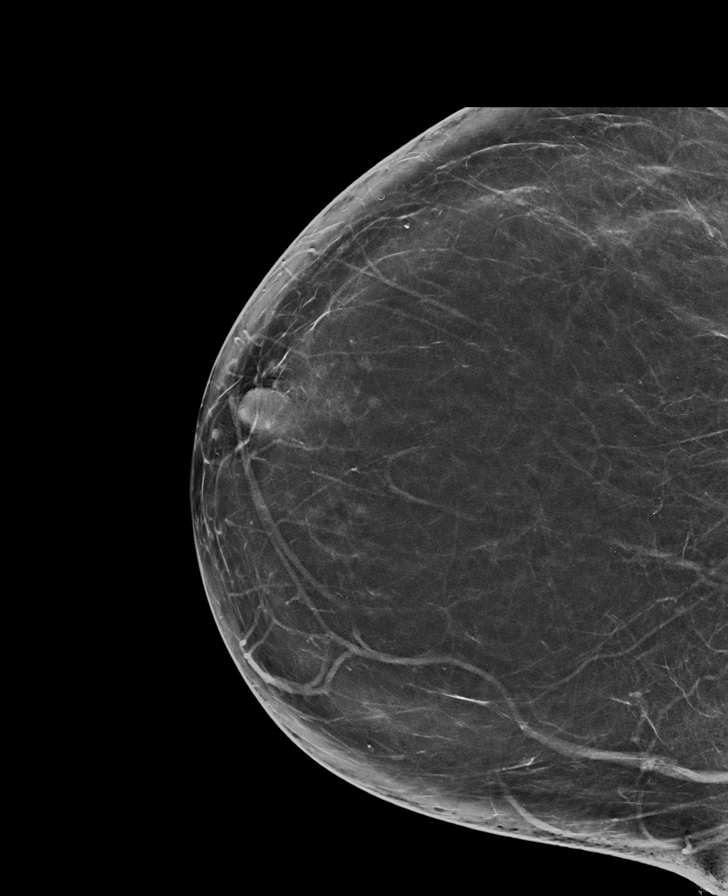

[R MLO synth-2D (1 of 2)]
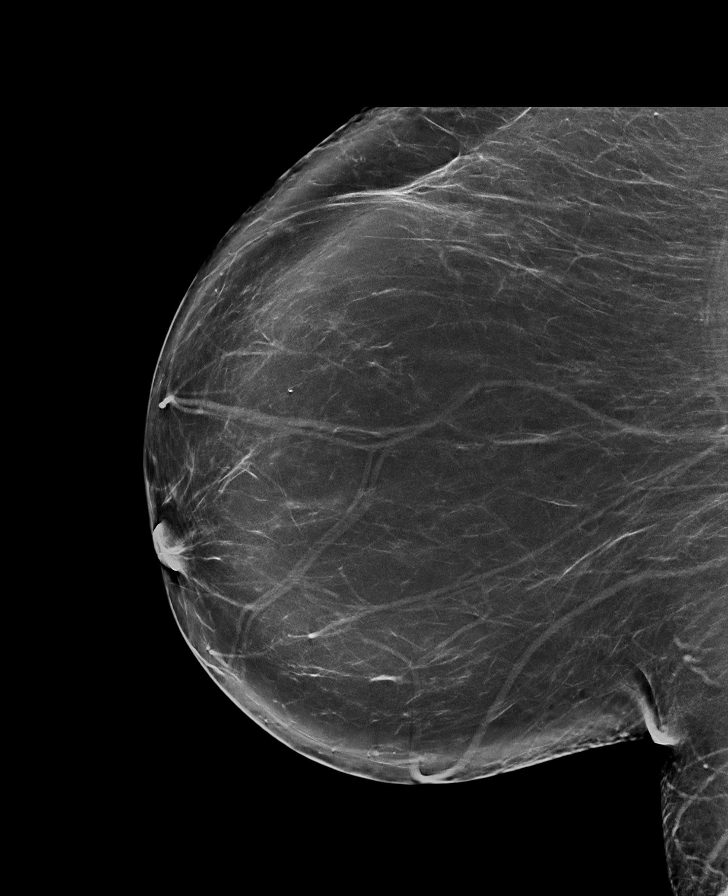

[R MLO synth-2D (2 of 2)]
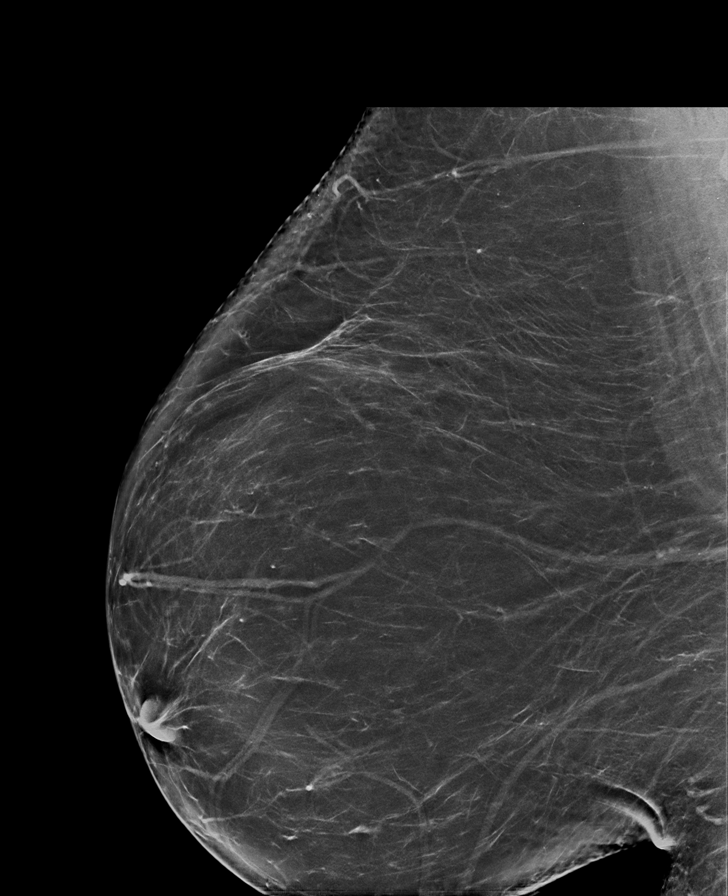

[L MLO synth-2D (1 of 2)]
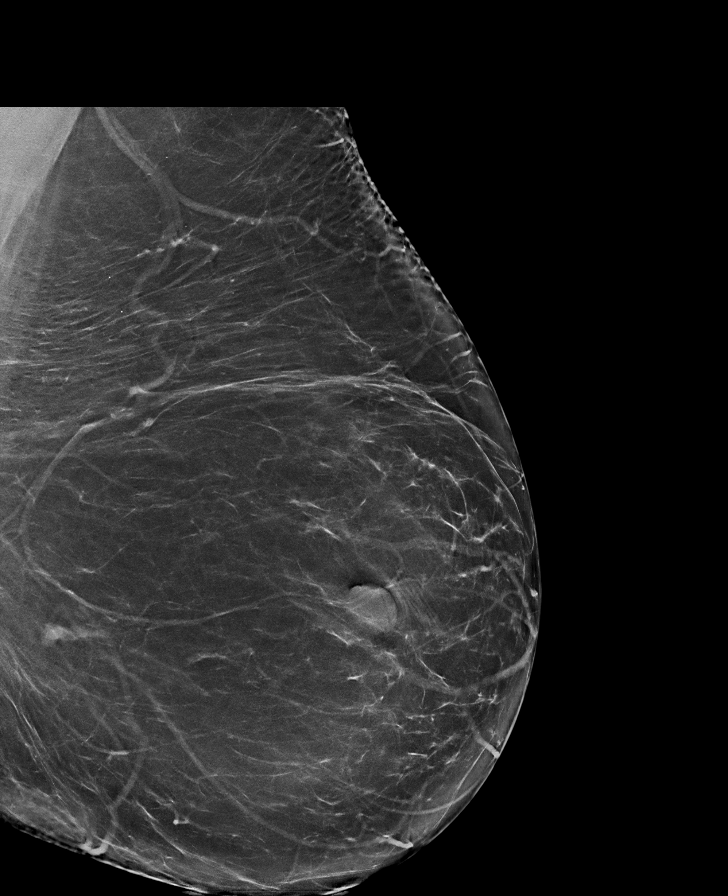

[L MLO synth-2D (2 of 2)]
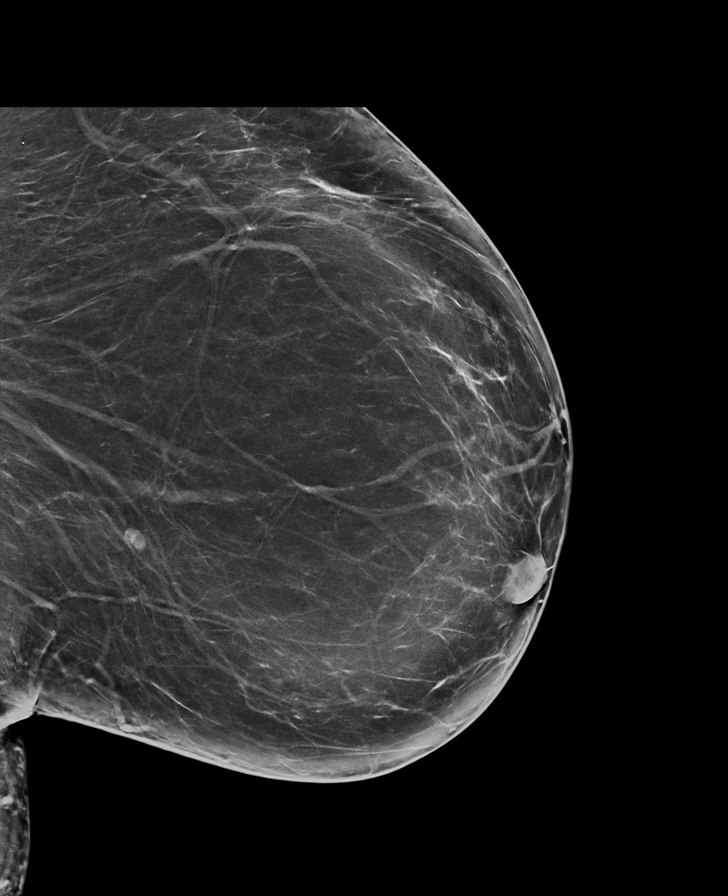

[L CC synth-2D (1 of 2)]
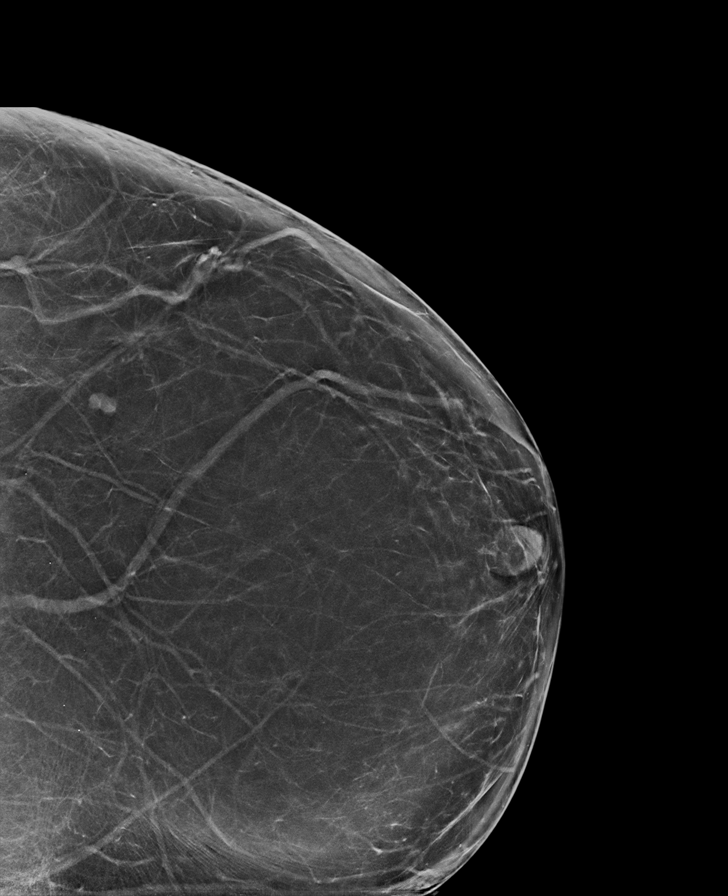

[L CC synth-2D (2 of 2)]
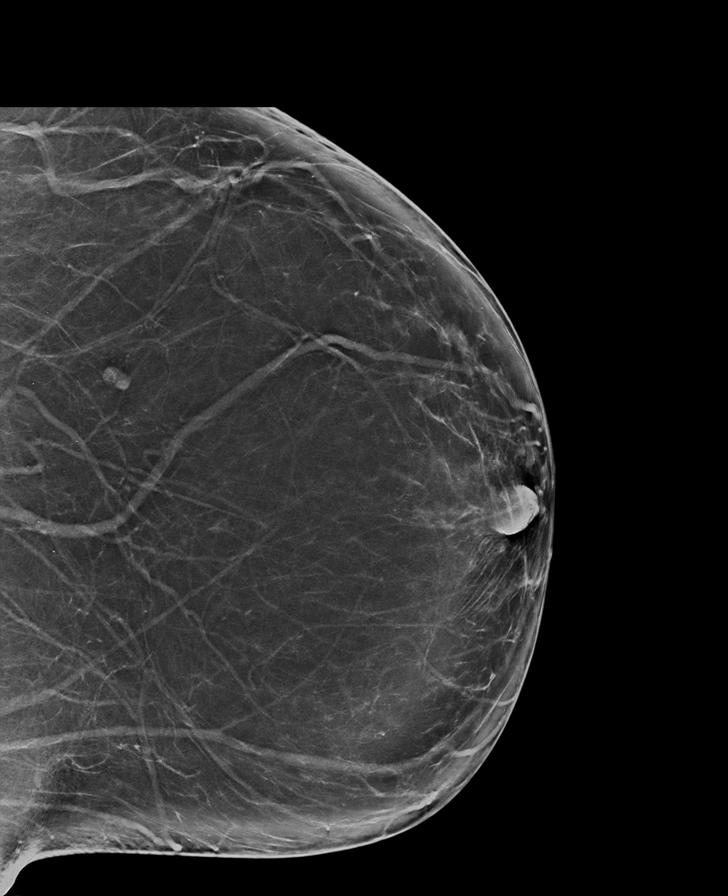

[L CC tomo · tomo slice 46/91.0]
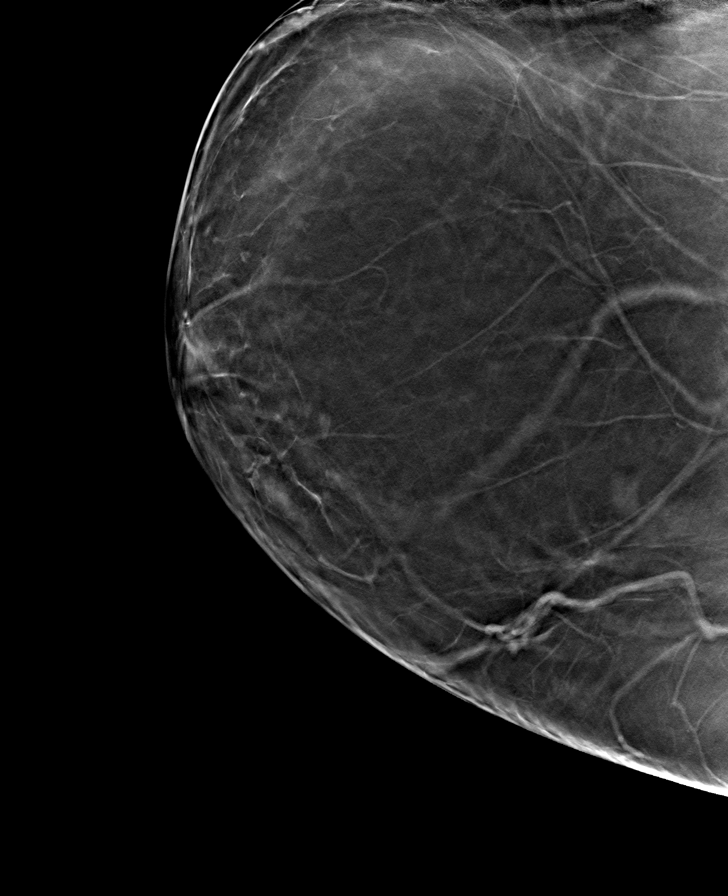

[8 of 40 positions shown; findings below may reference images not displayed]

ACR Breast Density Category b: There are scattered areas of
fibroglandular density.
FINDINGS: There are no findings suspicious for malignancy. Images were
processed with CAD.
IMPRESSION: No mammographic evidence of malignancy. A result letter of this
screening mammogram will be mailed directly to the patient.

RECOMMENDATION:
Screening mammogram in one year. (Code:[id number])

BI-RADS CATEGORY  1: Negative.

## 2022-01-17 ENCOUNTER — Other Ambulatory Visit: Payer: Self-pay

## 2022-01-17 ENCOUNTER — Other Ambulatory Visit (HOSPITAL_COMMUNITY): Payer: Self-pay

## 2022-01-19 ENCOUNTER — Other Ambulatory Visit: Payer: Self-pay

## 2022-01-22 ENCOUNTER — Other Ambulatory Visit: Payer: Self-pay

## 2022-01-29 ENCOUNTER — Other Ambulatory Visit: Payer: Self-pay

## 2022-01-29 ENCOUNTER — Ambulatory Visit: Payer: BC Managed Care – PPO | Admitting: Dermatology

## 2022-01-29 DIAGNOSIS — L409 Psoriasis, unspecified: Secondary | ICD-10-CM

## 2022-01-29 DIAGNOSIS — R21 Rash and other nonspecific skin eruption: Secondary | ICD-10-CM | POA: Diagnosis not present

## 2022-01-29 DIAGNOSIS — Z1283 Encounter for screening for malignant neoplasm of skin: Secondary | ICD-10-CM | POA: Diagnosis not present

## 2022-01-29 DIAGNOSIS — L4050 Arthropathic psoriasis, unspecified: Secondary | ICD-10-CM

## 2022-01-29 DIAGNOSIS — L814 Other melanin hyperpigmentation: Secondary | ICD-10-CM

## 2022-01-29 DIAGNOSIS — D229 Melanocytic nevi, unspecified: Secondary | ICD-10-CM

## 2022-01-29 DIAGNOSIS — L578 Other skin changes due to chronic exposure to nonionizing radiation: Secondary | ICD-10-CM

## 2022-01-29 DIAGNOSIS — D1800 Hemangioma unspecified site: Secondary | ICD-10-CM

## 2022-01-29 DIAGNOSIS — L821 Other seborrheic keratosis: Secondary | ICD-10-CM

## 2022-01-29 DIAGNOSIS — L720 Epidermal cyst: Secondary | ICD-10-CM

## 2022-01-29 DIAGNOSIS — Z86018 Personal history of other benign neoplasm: Secondary | ICD-10-CM

## 2022-01-29 MED ORDER — MOMETASONE FUROATE 0.1 % EX CREA
TOPICAL_CREAM | CUTANEOUS | 0 refills | Status: DC
  Filled 2022-01-29: qty 45, 30d supply, fill #0

## 2022-01-29 MED ORDER — VTAMA 1 % EX CREA: 1.0000 "application " | TOPICAL_CREAM | Freq: Every day | CUTANEOUS | 11 refills | Status: DC

## 2022-01-29 NOTE — Patient Instructions (Signed)
Due to recent changes in healthcare laws, you may see results of your pathology and/or laboratory studies on MyChart before the doctors have had a chance to review them. We understand that in some cases there may be results that are confusing or concerning to you. Please understand that not all results are received at the same time and often the doctors may need to interpret multiple results in order to provide you with the best plan of care or course of treatment. Therefore, we ask that you please give us 2 business days to thoroughly review all your results before contacting the office for clarification. Should we see a critical lab result, you will be contacted sooner.   If You Need Anything After Your Visit  If you have any questions or concerns for your doctor, please call our main line at [phone] and press option 4 to reach your doctor's medical assistant. If no one answers, please leave a voicemail as directed and we will return your call as soon as possible. Messages left after 4 pm will be answered the following business day.   You may also send us a message via MyChart. We typically respond to MyChart messages within 1-2 business days.  For prescription refills, please ask your pharmacy to contact our office. Our fax number is [phone].  If you have an urgent issue when the clinic is closed that cannot wait until the next business day, you can Aure your doctor at the number below.    Please note that while we do our best to be available for urgent issues outside of office hours, we are not available 24/7.   If you have an urgent issue and are unable to reach us, you may choose to seek medical care at your doctor's office, retail clinic, urgent care center, or emergency room.  If you have a medical emergency, please immediately call 911 or go to the emergency department.  Pager Numbers  - Dr. Kowalski: [phone]  - Dr. Moye: [phone]  - Dr. Stewart:  [phone]  In the event of inclement weather, please call our main line at [phone] for an update on the status of any delays or closures.  Dermatology Medication Tips: Please keep the boxes that topical medications come in in order to help keep track of the instructions about where and how to use these. Pharmacies typically print the medication instructions only on the boxes and not directly on the medication tubes.   If your medication is too expensive, please contact our office at [phone] option 4 or send us a message through MyChart.   We are unable to tell what your co-pay for medications will be in advance as this is different depending on your insurance coverage. However, we may be able to find a substitute medication at lower cost or fill out paperwork to get insurance to cover a needed medication.   If a prior authorization is required to get your medication covered by your insurance company, please allow us 1-2 business days to complete this process.  Drug prices often vary depending on where the prescription is filled and some pharmacies may offer cheaper prices.  The website www.goodrx.com contains coupons for medications through different pharmacies. The prices here do not account for what the cost may be with help from insurance (it may be cheaper with your insurance), but the website can give you the price if you did not use any insurance.  - You can print the associated coupon and take it with   your prescription to the pharmacy.  - You may also stop by our office during regular business hours and pick up a GoodRx coupon card.  - If you need your prescription sent electronically to a different pharmacy, notify our office through [facility] MyChart or by phone at [phone] option 4.     Si Usted Necesita Algo Despus de Su Visita  Tambin puede enviarnos un mensaje a travs de MyChart. Por lo general respondemos a los mensajes de MyChart en el transcurso de 1 a 2  das hbiles.  Para renovar recetas, por favor pida a su farmacia que se ponga en contacto con nuestra oficina. Nuestro nmero de fax es el [phone].  Si tiene un asunto urgente cuando la clnica est cerrada y que no puede esperar hasta el siguiente da hbil, puede llamar/localizar a su doctor(a) al nmero que aparece a continuacin.   Por favor, tenga en cuenta que aunque hacemos todo lo posible para estar disponibles para asuntos urgentes fuera del horario de oficina, no estamos disponibles las 24 horas del da, los 7 das de la semana.   Si tiene un problema urgente y no puede comunicarse con nosotros, puede optar por buscar atencin mdica  en el consultorio de su doctor(a), en una clnica privada, en un centro de atencin urgente o en una sala de emergencias.  Si tiene una emergencia mdica, por favor llame inmediatamente al 911 o vaya a la sala de emergencias.  Nmeros de bper  - Dr. Kowalski: [phone]  - Dra. Moye: [phone]  - Dra. Stewart: [phone]  En caso de inclemencias del tiempo, por favor llame a nuestra lnea principal al [phone] para una actualizacin sobre el estado de cualquier retraso o cierre.  Consejos para la medicacin en dermatologa: Por favor, guarde las cajas en las que vienen los medicamentos de uso tpico para ayudarle a seguir las instrucciones sobre dnde y cmo usarlos. Las farmacias generalmente imprimen las instrucciones del medicamento slo en las cajas y no directamente en los tubos del medicamento.   Si su medicamento es muy caro, por favor, pngase en contacto con nuestra oficina llamando al [phone] y presione la opcin 4 o envenos un mensaje a travs de MyChart.   No podemos decirle cul ser su copago por los medicamentos por adelantado ya que esto es diferente dependiendo de la cobertura de su seguro. Sin embargo, es posible que podamos encontrar un medicamento sustituto a menor costo o llenar un formulario para que el  seguro cubra el medicamento que se considera necesario.   Si se requiere una autorizacin previa para que su compaa de seguros cubra su medicamento, por favor permtanos de 1 a 2 das hbiles para completar este proceso.  Los precios de los medicamentos varan con frecuencia dependiendo del lugar de dnde se surte la receta y alguna farmacias pueden ofrecer precios ms baratos.  El sitio web www.goodrx.com tiene cupones para medicamentos de diferentes farmacias. Los precios aqu no tienen en cuenta lo que podra costar con la ayuda del seguro (puede ser ms barato con su seguro), pero el sitio web puede darle el precio si no utiliz ningn seguro.  - Puede imprimir el cupn correspondiente y llevarlo con su receta a la farmacia.  - Tambin puede pasar por nuestra oficina durante el horario de atencin regular y recoger una tarjeta de cupones de GoodRx.  - Si necesita que su receta se enve electrnicamente a una farmacia diferente, informe a nuestra oficina a travs de MyChart de [facility]   o por telfono llamando al [phone] y presione la opcin 4.  

## 2022-01-29 NOTE — Progress Notes (Unsigned)
Follow-Up Visit   Subjective  Kelsey Randolph is a 46 y.o. female who presents for the following: Annual Exam (Hx dysplastic nevi ) and Psoriasis (Patient on Cosentyx as prescribed by rheumatologi). The patient presents for Total-Body Skin Exam (TBSE) for skin cancer screening and mole check.  The patient has spots, moles and lesions to be evaluated, some may be new or changing.     The following portions of the chart were reviewed this encounter and updated as appropriate:       Review of Systems:  No other skin or systemic complaints except as noted in HPI or Assessment and Plan.  Objective  Well appearing patient in no apparent distress; mood and affect are within normal limits.  A full examination was performed including scalp, head, eyes, ears, nose, lips, neck, chest, axillae, abdomen, back, buttocks, bilateral upper extremities, bilateral lower extremities, hands, feet, fingers, toes, fingernails, and toenails. All findings within normal limits unless otherwise noted below.  B/L elbows Psoriatic plaques of the elbows.  Right Axilla 0.5 cm firm SQ nodule.    Assessment & Plan  Psoriasis B/L elbows  With PsA -   Psoriasis is a chronic non-curable, but treatable genetic/hereditary disease that may have other systemic features affecting other organ systems such as joints (Psoriatic Arthritis). It is associated with an increased risk of inflammatory bowel disease, heart disease, non-alcoholic fatty liver disease, and depression.    Continue Cosentyx as prescribed by rheumatologist.   Start Vtama cream to aa's QD.   Related Medications Tapinarof (VTAMA) 1 % CREA Apply 1 application topically daily. Apply to aa's psoriasis QD PRN.  Rash Left Breast  Irritant contact vs allergic contact vs atopic dermatitis -   Start Mometasone 0.1% cream to aa's BID 5d/wk PRN. Topical steroids (such as triamcinolone, fluocinolone, fluocinonide, mometasone, clobetasol, halobetasol,  betamethasone, hydrocortisone) can cause thinning and lightening of the skin if they are used for too long in the same area. Your physician has selected the right strength medicine for your problem and area affected on the body. Please use your medication only as directed by your physician to prevent side effects.    Related Medications Ketoconazole 2 % GEL Apply 1 application topically daily. Cover the affected and immediate surrounding area for 2 weeks  Epidermal inclusion cyst Right Axilla  Benign-appearing. Exam most consistent with an epidermal inclusion cyst. Discussed that a cyst is a benign growth that can grow over time and sometimes get irritated or inflamed. Recommend observation if it is not bothersome. Discussed option of surgical excision to remove it if it is growing, symptomatic, or other changes noted. Please call for new or changing lesions so they can be evaluated.     Lentigines - Scattered tan macules - Due to sun exposure - Benign-appearing, observe - Recommend daily broad spectrum sunscreen SPF 30+ to sun-exposed areas, reapply every 2 hours as needed. - Call for any changes  Seborrheic Keratoses - Stuck-on, waxy, tan-brown papules and/or plaques  - Benign-appearing - Discussed benign etiology and prognosis. - Observe - Call for any changes  Melanocytic Nevi - Tan-brown and/or pink-flesh-colored symmetric macules and papules - Benign appearing on exam today - Observation - Call clinic for new or changing moles - Recommend daily use of broad spectrum spf 30+ sunscreen to sun-exposed areas.   Hemangiomas - Red papules - Discussed benign nature - Observe - Call for any changes  Actinic Damage - Chronic condition, secondary to cumulative UV/sun exposure - diffuse scaly erythematous  macules with underlying dyspigmentation - Recommend daily broad spectrum sunscreen SPF 30+ to sun-exposed areas, reapply every 2 hours as needed.  - Staying in the shade or  wearing long sleeves, sun glasses (UVA+UVB protection) and wide brim hats (4-inch brim around the entire circumference of the hat) are also recommended for sun protection.  - Call for new or changing lesions.  History of Dysplastic Nevi - No evidence of recurrence today - Recommend regular full body skin exams - Recommend daily broad spectrum sunscreen SPF 30+ to sun-exposed areas, reapply every 2 hours as needed.  - Call if any new or changing lesions are noted between office visits  Skin cancer screening performed today.  Return in about 6 months (around 07/31/2022) for TBSE.  Luther Redo, CMA, am acting as scribe for Sarina Ser, MD .

## 2022-01-30 ENCOUNTER — Encounter: Payer: Self-pay | Admitting: Dermatology

## 2022-02-16 ENCOUNTER — Other Ambulatory Visit: Payer: Self-pay

## 2022-02-20 ENCOUNTER — Other Ambulatory Visit: Payer: Self-pay

## 2022-02-25 ENCOUNTER — Other Ambulatory Visit: Payer: Self-pay

## 2022-03-04 ENCOUNTER — Other Ambulatory Visit: Payer: Self-pay

## 2022-03-05 ENCOUNTER — Other Ambulatory Visit: Payer: Self-pay

## 2022-03-05 MED ORDER — WEGOVY 1.7 MG/0.75ML ~~LOC~~ SOAJ
SUBCUTANEOUS | 1 refills | Status: DC
  Filled 2022-03-05 – 2022-03-18 (×3): qty 3, 28d supply, fill #0
  Filled 2022-04-16: qty 3, 28d supply, fill #1

## 2022-03-07 ENCOUNTER — Encounter: Payer: Self-pay | Admitting: Dermatology

## 2022-03-13 ENCOUNTER — Other Ambulatory Visit: Payer: Self-pay

## 2022-03-18 ENCOUNTER — Other Ambulatory Visit: Payer: Self-pay

## 2022-03-19 ENCOUNTER — Other Ambulatory Visit: Payer: Self-pay

## 2022-03-20 ENCOUNTER — Other Ambulatory Visit: Payer: Self-pay

## 2022-03-23 ENCOUNTER — Other Ambulatory Visit: Payer: Self-pay

## 2022-03-23 ENCOUNTER — Other Ambulatory Visit: Payer: Self-pay | Admitting: Emergency Medicine

## 2022-03-23 MED ORDER — ONDANSETRON 4 MG PO TBDP
ORAL_TABLET | ORAL | 0 refills | Status: DC
  Filled 2022-03-23: qty 20, 7d supply, fill #0

## 2022-04-09 ENCOUNTER — Other Ambulatory Visit: Payer: Self-pay

## 2022-04-09 MED ORDER — SULFASALAZINE 500 MG PO TABS
500.0000 mg | ORAL_TABLET | Freq: Two times a day (BID) | ORAL | 5 refills | Status: DC
  Filled 2022-04-09: qty 60, 30d supply, fill #0
  Filled 2022-05-14: qty 60, 30d supply, fill #1
  Filled 2022-06-11: qty 60, 30d supply, fill #2
  Filled 2022-07-08: qty 60, 30d supply, fill #3

## 2022-04-16 ENCOUNTER — Other Ambulatory Visit: Payer: Self-pay

## 2022-04-16 MED ORDER — METHYLPREDNISOLONE 4 MG PO TBPK
ORAL_TABLET | ORAL | 0 refills | Status: DC
  Filled 2022-04-16: qty 21, 6d supply, fill #0

## 2022-04-16 MED ORDER — ETODOLAC 500 MG PO TABS
500.0000 mg | ORAL_TABLET | Freq: Two times a day (BID) | ORAL | 0 refills | Status: AC
  Filled 2022-04-16: qty 28, 14d supply, fill #0

## 2022-04-27 ENCOUNTER — Other Ambulatory Visit: Payer: Self-pay

## 2022-04-27 MED ORDER — WEGOVY 1.7 MG/0.75ML ~~LOC~~ SOAJ
SUBCUTANEOUS | 1 refills | Status: DC
  Filled 2022-04-27 – 2022-05-14 (×2): qty 3, 28d supply, fill #0
  Filled 2022-12-21: qty 3, 28d supply, fill #1

## 2022-04-28 ENCOUNTER — Other Ambulatory Visit: Payer: Self-pay | Admitting: Family Medicine

## 2022-04-28 DIAGNOSIS — Z1231 Encounter for screening mammogram for malignant neoplasm of breast: Secondary | ICD-10-CM

## 2022-05-08 ENCOUNTER — Other Ambulatory Visit: Payer: Self-pay

## 2022-05-08 MED ORDER — ETODOLAC 500 MG PO TABS
ORAL_TABLET | ORAL | 0 refills | Status: DC
Start: 2022-05-08 — End: 2022-10-19
  Filled 2022-05-08: qty 60, 30d supply, fill #0

## 2022-05-14 ENCOUNTER — Other Ambulatory Visit: Payer: Self-pay

## 2022-05-15 ENCOUNTER — Other Ambulatory Visit: Payer: Self-pay

## 2022-06-02 ENCOUNTER — Encounter: Payer: Self-pay | Admitting: Urology

## 2022-06-02 ENCOUNTER — Ambulatory Visit
Admission: RE | Admit: 2022-06-02 | Discharge: 2022-06-02 | Disposition: A | Discharge status: Home / Self Care | Payer: BC Managed Care – PPO | Source: Ambulatory Visit | Attending: Family Medicine | Admitting: Family Medicine

## 2022-06-02 DIAGNOSIS — Z1231 Encounter for screening mammogram for malignant neoplasm of breast: Secondary | ICD-10-CM | POA: Diagnosis present

## 2022-06-03 ENCOUNTER — Other Ambulatory Visit: Payer: Self-pay

## 2022-06-03 MED ORDER — CELECOXIB 100 MG PO CAPS
ORAL_CAPSULE | ORAL | 0 refills | Status: DC
  Filled 2022-06-03: qty 60, 30d supply, fill #0

## 2022-06-05 ENCOUNTER — Other Ambulatory Visit: Payer: Self-pay | Admitting: Sports Medicine

## 2022-06-05 ENCOUNTER — Other Ambulatory Visit: Payer: Self-pay

## 2022-06-05 DIAGNOSIS — M7542 Impingement syndrome of left shoulder: Secondary | ICD-10-CM

## 2022-06-05 DIAGNOSIS — G8929 Other chronic pain: Secondary | ICD-10-CM

## 2022-06-05 DIAGNOSIS — M778 Other enthesopathies, not elsewhere classified: Secondary | ICD-10-CM

## 2022-06-05 MED ORDER — CYCLOBENZAPRINE HCL 10 MG PO TABS
10.0000 mg | ORAL_TABLET | Freq: Three times a day (TID) | ORAL | 0 refills | Status: DC | PRN
  Filled 2022-06-05: qty 30, 10d supply, fill #0

## 2022-06-05 MED ORDER — MELOXICAM 7.5 MG PO TABS
ORAL_TABLET | ORAL | 0 refills | Status: DC
  Filled 2022-06-05: qty 14, 7d supply, fill #0

## 2022-06-08 ENCOUNTER — Other Ambulatory Visit: Payer: Self-pay

## 2022-06-11 ENCOUNTER — Other Ambulatory Visit: Payer: Self-pay

## 2022-06-11 MED ORDER — WEGOVY 2.4 MG/0.75ML ~~LOC~~ SOAJ
SUBCUTANEOUS | 3 refills | Status: DC
  Filled 2022-06-11: qty 3, 28d supply, fill #0
  Filled 2022-07-08: qty 3, 28d supply, fill #1
  Filled 2022-08-11 – 2023-01-19 (×8): qty 3, 28d supply, fill #2
  Filled 2023-02-18: qty 3, 28d supply, fill #3

## 2022-06-11 MED ORDER — BUPROPION HCL ER (XL) 300 MG PO TB24
ORAL_TABLET | ORAL | 1 refills | Status: DC
  Filled 2022-06-11: qty 90, 90d supply, fill #0
  Filled 2022-09-16: qty 90, 90d supply, fill #1

## 2022-06-11 MED ORDER — ALPRAZOLAM 0.5 MG PO TABS
ORAL_TABLET | ORAL | 1 refills | Status: DC
  Filled 2022-06-11: qty 90, 90d supply, fill #0
  Filled 2022-10-01: qty 90, 90d supply, fill #1

## 2022-06-11 MED ORDER — CLOTRIMAZOLE 1 % EX CREA
TOPICAL_CREAM | Freq: Two times a day (BID) | CUTANEOUS | 5 refills | Status: DC
  Filled 2022-06-11: qty 60, fill #0
  Filled 2022-06-15: qty 60, 30d supply, fill #0
  Filled 2022-06-15: qty 60, fill #0

## 2022-06-15 ENCOUNTER — Other Ambulatory Visit: Payer: Self-pay

## 2022-06-15 MED ORDER — AMOXICILLIN-POT CLAVULANATE 875-125 MG PO TABS
ORAL_TABLET | ORAL | 0 refills | Status: DC
  Filled 2022-06-15: qty 14, 7d supply, fill #0

## 2022-06-16 ENCOUNTER — Ambulatory Visit
Admission: RE | Admit: 2022-06-16 | Discharge: 2022-06-16 | Disposition: A | Discharge status: Home / Self Care | Payer: BC Managed Care – PPO | Source: Ambulatory Visit | Attending: Sports Medicine | Admitting: Sports Medicine

## 2022-06-16 DIAGNOSIS — G8929 Other chronic pain: Secondary | ICD-10-CM

## 2022-06-16 DIAGNOSIS — M7542 Impingement syndrome of left shoulder: Secondary | ICD-10-CM

## 2022-06-16 DIAGNOSIS — M778 Other enthesopathies, not elsewhere classified: Secondary | ICD-10-CM | POA: Insufficient documentation

## 2022-06-16 DIAGNOSIS — M25512 Pain in left shoulder: Secondary | ICD-10-CM | POA: Diagnosis not present

## 2022-06-16 MED ORDER — LIDOCAINE HCL (PF) 1 % IJ SOLN
5.0000 mL | Freq: Once | INTRAMUSCULAR | Status: AC
  Administered 2022-06-16: 5 mL via INTRADERMAL
  Filled 2022-06-16: qty 5

## 2022-06-16 MED ORDER — SODIUM CHLORIDE (PF) 0.9 % IJ SOLN
10.0000 mL | INTRAMUSCULAR | Status: DC | PRN
  Administered 2022-06-16: 10 mL via INTRAVENOUS

## 2022-06-16 MED ORDER — IOHEXOL 180 MG/ML  SOLN
20.0000 mL | Freq: Once | INTRAMUSCULAR | Status: AC | PRN
  Administered 2022-06-16: 20 mL

## 2022-06-16 MED ORDER — GADOBUTROL 1 MMOL/ML IV SOLN
0.0500 mL | Freq: Once | INTRAVENOUS | Status: AC | PRN
  Administered 2022-06-16: 0.05 mL

## 2022-06-24 ENCOUNTER — Ambulatory Visit: Payer: BC Managed Care – PPO | Admitting: Urology

## 2022-07-06 ENCOUNTER — Ambulatory Visit
Admission: RE | Admit: 2022-07-06 | Discharge: 2022-07-06 | Disposition: A | Discharge status: Home / Self Care | Payer: BC Managed Care – PPO | Source: Ambulatory Visit | Attending: Urology | Admitting: Urology

## 2022-07-06 ENCOUNTER — Ambulatory Visit
Admission: RE | Admit: 2022-07-06 | Discharge: 2022-07-06 | Disposition: A | Discharge status: Home / Self Care | Payer: BC Managed Care – PPO | Attending: Urology | Admitting: Urology

## 2022-07-06 DIAGNOSIS — N200 Calculus of kidney: Secondary | ICD-10-CM | POA: Insufficient documentation

## 2022-07-07 ENCOUNTER — Other Ambulatory Visit: Payer: Self-pay

## 2022-07-07 MED ORDER — NEOMYCIN-POLYMYXIN-DEXAMETH 0.1 % OP SUSP
OPHTHALMIC | 0 refills | Status: DC
  Filled 2022-07-07: qty 5, 7d supply, fill #0

## 2022-07-08 ENCOUNTER — Other Ambulatory Visit: Payer: Self-pay

## 2022-07-08 ENCOUNTER — Encounter: Payer: Self-pay | Admitting: Urology

## 2022-07-08 ENCOUNTER — Ambulatory Visit (INDEPENDENT_AMBULATORY_CARE_PROVIDER_SITE_OTHER): Payer: BC Managed Care – PPO | Admitting: Urology

## 2022-07-08 VITALS — BP 133/92 | HR 98 | Ht 63.0 in | Wt 268.0 lb

## 2022-07-08 DIAGNOSIS — N200 Calculus of kidney: Secondary | ICD-10-CM | POA: Diagnosis not present

## 2022-07-09 ENCOUNTER — Other Ambulatory Visit: Payer: Self-pay

## 2022-07-09 ENCOUNTER — Encounter: Payer: Self-pay | Admitting: Urology

## 2022-07-09 NOTE — Progress Notes (Signed)
07/08/2022 8:18 PM   Kelsey Randolph [birth year] [id number]  Referring provider: Peggye Form, NP [place],  [place] [address]  Chief Complaint  Patient presents with   Nephrolithiasis   Urologic history: 1.  Left nephrolithiasis Nonobstructing left renal calculi incidentally noted on abdominal CT performed in the ED/3/23 for right abdominal pain History ureteroscopic stone removal 2004 Largest calculus 10 mm CT urogram with calyceal obstruction and duplicated collecting system Asymptomatic and elected observation   HPI: 46 y.o. female presents for 6 month follow-up visit.  Doing well since last visit No bothersome LUTS Denies dysuria, gross hematuria Denies flank, abdominal or pelvic pain KUB performed today was reviewed and stable left lower pole calculus with smaller scattered calculi   PMH: Past Medical History:  Diagnosis Date   Allergy    Anxiety    Arthritis    PSORIATIC   Depression    Dysplastic nevus 07/27/2019   Right lateral bicep. Severe atypia, close to margin. Excised 11/14/2019, margins free.   Dysplastic nevus 07/30/2021   right mid back paraspinal, mod atypia   GERD (gastroesophageal reflux disease)    RARE-NO MEDS   History of kidney stones 2004   Hx of dysplastic nevus 07/16/2016   Right superior medial buttocks just lateral to superior crease. Mild atypia clost to deep margin   Hx of dysplastic nevus 07/12/2017   Left upper back sup. scapula. Severe atypia with focal fibrosis, close to lateral margin. Excised: 09/28/2017. Margins free   Hx of dysplastic nevus 07/12/2017   Left LQA periumbilical. Moderate atypia, irritated, close to lateral margin   Hx of dysplastic nevus 07/27/2019, exc 11/14/19   Right lateral bicep. Severe atypia, close to margin   Hypothyroidism    PONV (postoperative nausea and vomiting)    WITH KIDNEY STONE AND TONSILLECTOMY BUT HAD NO N/V WITH HER LAST FOOT SURGERY IN 2013   Psoriasis    Cosentyx     Surgical History: Past Surgical History:  Procedure Laterality Date   FOOT SURGERY Left    kidney stones removed  2004   TARSAL TUNNEL RELEASE Left 04/16/2017   Procedure: TARSAL TUNNEL RELEASE;  Surgeon: Samara Deist, DPM;  Location: [facility] ORS;  Service: Podiatry;  Laterality: Left;   TENDON REPAIR Left 04/16/2017   Procedure: FLEXOR TENDON REPAIR-SECONDARY ;  Surgeon: Samara Deist, DPM;  Location: [facility] ORS;  Service: Podiatry;  Laterality: Left;   tonsillectomy and adenoidectomy  1993    Home Medications:  Allergies as of 07/08/2022   No Known Allergies      Medication List        Accurate as of July 08, 2022 11:59 PM. If you have any questions, ask your nurse or doctor.          STOP taking these medications    Apple Cider Vinegar Plus Tabs Stopped by: Abbie Sons, MD   methylPREDNISolone 4 MG Tbpk tablet Commonly known as: MEDROL DOSEPAK Stopped by: Abbie Sons, MD   predniSONE 20 MG tablet Commonly known as: DELTASONE Stopped by: Abbie Sons, MD   Saxenda 18 MG/3ML Sopn Generic drug: Liraglutide -Weight Management Stopped by: Abbie Sons, MD   valACYclovir 1000 MG tablet Commonly known as: VALTREX Stopped by: Abbie Sons, MD       TAKE these medications    acetaminophen 500 MG tablet Commonly known as: TYLENOL Take 500 mg by mouth every 6 (six) hours as needed.   ALPRAZolam 0.5 MG tablet  Commonly known as: XANAX TAKE 1 TABLET BY MOUTH AT BEDTIME AS NEEDED FOR ANXIETY.   ALPRAZolam 0.5 MG tablet Commonly known as: XANAX TAKE 1 TABLET BY MOUTH AT BEDTIME AS NEEDED FOR ANXIETY.   amoxicillin-clavulanate 875-125 MG tablet Commonly known as: AUGMENTIN Take 1 tablet (875 mg total) by mouth every 12 (twelve) hours for 7 days   b complex vitamins tablet Take 1 tablet by mouth daily.   buPROPion 300 MG 24 hr tablet Commonly known as: WELLBUTRIN XL Take 1 tablet (300 mg total) by mouth once daily   celecoxib 100 MG  capsule Commonly known as: CELEBREX Take 1 capsule by mouth 2 times daily for 30 days   cetirizine 10 MG tablet Commonly known as: ZYRTEC Take 10 mg by mouth daily.   clotrimazole 1 % cream Commonly known as: LOTRIMIN Apply topically 2 (two) times daily   cyclobenzaprine 10 MG tablet Commonly known as: FLEXERIL Take 1 tablet (10 mg total) by mouth 3 (three) times daily as needed for muscle spasms for up to 10 days   cyclobenzaprine 10 MG tablet Commonly known as: FLEXERIL Take 1 tablet (10 mg total) by mouth 3 (three) times daily as needed muscle spasms for up to 10 days.   dicyclomine 10 MG capsule Commonly known as: Bentyl Take 1 capsule (10 mg total) by mouth 4 (four) times daily -  before meals and at bedtime.   etodolac 500 MG tablet Commonly known as: LODINE Take 1 tablet (500 mg total) by mouth 2 (two) times daily for 30 days   hydrochlorothiazide 25 MG tablet Commonly known as: HYDRODIURIL Take 1 tablet (25 mg total) by mouth once daily   levothyroxine 50 MCG tablet Commonly known as: SYNTHROID Take 1 tablet (50 mcg total) by mouth daily before breakfast.   meloxicam 7.5 MG tablet Commonly known as: MOBIC Take 1 tablet by mouth 2 times a day   mometasone 0.1 % cream Commonly known as: ELOCON Apply to affected areas of rash twice a day 5 days a week as needed. (Apply to aa's rash BID 5d/wk PRN.)   neomycin-polymyxin b-dexamethasone 3.5-10000-0.1 Susp Commonly known as: Maxitrol Place 1 drop into affected eye three times a day   ondansetron 4 MG disintegrating tablet Commonly known as: ZOFRAN-ODT Take 1 tablet (4 mg total) by mouth every 8 (eight) hours as needed for nausea or vomiting.   ondansetron 4 MG disintegrating tablet Commonly known as: ZOFRAN-ODT Dissolve 1 tablet (4 mg total) on the tongue every 8 (eight) hours as needed for Nausea for up to 7 days (Take 1 tablet (4 mg total) by mouth every 8 (eight) hours as needed for Nausea for up to 7  days)   potassium chloride 10 MEQ tablet Commonly known as: KLOR-CON Take 1 tablet (10 mEq total) by mouth once daily   sulfaSALAzine 500 MG tablet Commonly known as: AZULFIDINE Take 500 mg by mouth 2 (two) times daily.   sulfaSALAzine 500 MG tablet Commonly known as: AZULFIDINE Take 1 tablet (500 mg total) by mouth 2 (two) times daily   Unifine Pentips 31G X 8 MM Misc Generic drug: Insulin Pen Needle Use as directed (Use as directed with mounjaro injections)   Vitamin D (Ergocalciferol) 1.25 MG (50000 UNIT) Caps capsule Commonly known as: DRISDOL Take 1 capsule (50,000 Units total) by mouth every 7 (seven) days. What changed: Another medication with the same name was removed. Continue taking this medication, and follow the directions you see here. Changed by: Ronda Fairly  Dariela Stoker, MD   Vtama 1 % Crea Generic drug: Tapinarof Apply 1 application  topically daily. Apply to aa's psoriasis QD PRN.   Wegovy 1.7 MG/0.75ML Soaj Generic drug: Semaglutide-Weight Management Inject 0.75 mLs (1.7 mg total) subcutaneously once a week   Wegovy 2.4 MG/0.75ML Soaj Generic drug: Semaglutide-Weight Management Inject 0.75 mLs (2.4 mg total) subcutaneously once a week        Allergies: No Known Allergies  Family History: Family History  Problem Relation Age of Onset   Depression Mother    Hyperlipidemia Mother    Hypertension Mother    Healthy Brother    Heart disease Paternal Grandfather        MI; CAD   Heart disease Father    Dementia Maternal Grandmother    Diabetes Maternal Grandmother    Breast cancer Neg Hx     Social History:  reports that she quit smoking about 6 years ago. Her smoking use included cigarettes. She has a 10.00 pack-year smoking history. She has never used smokeless tobacco. She reports current alcohol use. She reports that she does not use drugs.   Physical Exam: BP (!) 133/92   Pulse 98   Ht '5\' 3"'$  (1.6 m)   Wt 268 lb (121.6 kg)   LMP 06/29/2022    BMI 47.47 kg/m   Constitutional:  Alert and oriented, No acute distress. HEENT: [place] AT Respiratory: Normal respiratory effort, no increased work of breathing. Psychiatric: Normal mood and affect.    Pertinent Imaging: KUB images were personally reviewed and interpreted  Abdomen 1 view (KUB)  Narrative CLINICAL DATA:  Follow-up kidney stones  EXAM: ABDOMEN - 1 VIEW  COMPARISON:  11/28/2021  FINDINGS: Scattered large and small bowel gas is noted. Ingested material is noted within the duodenum. Multiple left-sided renal calculi are again identified. The largest of these measures approximately 10 mm. No ureteral stones are noted.  IMPRESSION: Stable left renal calculi.   Electronically Signed By: Inez Catalina M.D. On: 07/07/2022 01:33   Assessment & Plan:    1. Nephrolithiasis Stable Asymptomatic and desires observation 1 year follow-up with KUB; call earlier for flank pain, hematuria   Abbie Sons, MD  [facility] [address], [place], [place] [address] [phone]

## 2022-07-10 ENCOUNTER — Other Ambulatory Visit: Payer: Self-pay

## 2022-07-12 ENCOUNTER — Other Ambulatory Visit: Payer: Self-pay

## 2022-07-13 ENCOUNTER — Other Ambulatory Visit: Payer: Self-pay

## 2022-07-15 ENCOUNTER — Other Ambulatory Visit: Payer: Self-pay

## 2022-07-15 MED ORDER — LEVOTHYROXINE SODIUM 50 MCG PO TABS
50.0000 ug | ORAL_TABLET | Freq: Every day | ORAL | 1 refills | Status: DC
  Filled 2022-07-15 – 2022-07-24 (×2): qty 90, 90d supply, fill #0
  Filled 2023-02-12: qty 90, 90d supply, fill #1

## 2022-07-17 ENCOUNTER — Encounter: Payer: Self-pay | Admitting: Urology

## 2022-07-24 ENCOUNTER — Other Ambulatory Visit: Payer: Self-pay

## 2022-07-30 ENCOUNTER — Ambulatory Visit (INDEPENDENT_AMBULATORY_CARE_PROVIDER_SITE_OTHER): Payer: BC Managed Care – PPO | Admitting: Dermatology

## 2022-07-30 ENCOUNTER — Encounter: Payer: Self-pay | Admitting: Dermatology

## 2022-07-30 DIAGNOSIS — Z86018 Personal history of other benign neoplasm: Secondary | ICD-10-CM

## 2022-07-30 DIAGNOSIS — L4050 Arthropathic psoriasis, unspecified: Secondary | ICD-10-CM | POA: Diagnosis not present

## 2022-07-30 DIAGNOSIS — L821 Other seborrheic keratosis: Secondary | ICD-10-CM

## 2022-07-30 DIAGNOSIS — L409 Psoriasis, unspecified: Secondary | ICD-10-CM

## 2022-07-30 DIAGNOSIS — D225 Melanocytic nevi of trunk: Secondary | ICD-10-CM | POA: Diagnosis not present

## 2022-07-30 DIAGNOSIS — Z79899 Other long term (current) drug therapy: Secondary | ICD-10-CM

## 2022-07-30 DIAGNOSIS — D229 Melanocytic nevi, unspecified: Secondary | ICD-10-CM

## 2022-07-30 DIAGNOSIS — L814 Other melanin hyperpigmentation: Secondary | ICD-10-CM

## 2022-07-30 DIAGNOSIS — D492 Neoplasm of unspecified behavior of bone, soft tissue, and skin: Secondary | ICD-10-CM

## 2022-07-30 DIAGNOSIS — Z1283 Encounter for screening for malignant neoplasm of skin: Secondary | ICD-10-CM

## 2022-07-30 DIAGNOSIS — L578 Other skin changes due to chronic exposure to nonionizing radiation: Secondary | ICD-10-CM | POA: Diagnosis not present

## 2022-07-30 NOTE — Progress Notes (Signed)
Follow-Up Visit   Subjective  Kelsey Randolph is a 46 y.o. female who presents for the following: Annual Exam (Hx of multiple dysplastic nevi) and Psoriasis (On Cosenyx from rheumatologist for PsA. Using Vtama cream as needed. B/L elbows). The patient presents for Total-Body Skin Exam (TBSE) for skin cancer screening and mole check.  The patient has spots, moles and lesions to be evaluated, some may be new or changing and the patient has concerns that these could be cancer.  The following portions of the chart were reviewed this encounter and updated as appropriate:  Tobacco  Allergies  Meds  Problems  Med Hx  Surg Hx  Fam Hx     Review of Systems: No other skin or systemic complaints except as noted in HPI or Assessment and Plan.  Objective  Well appearing patient in no apparent distress; mood and affect are within normal limits.  A full examination was performed including scalp, head, eyes, ears, nose, lips, neck, chest, axillae, abdomen, back, buttocks, bilateral upper extremities, bilateral lower extremities, hands, feet, fingers, toes, fingernails, and toenails. All findings within normal limits unless otherwise noted below.  Elbows Pink patches   Right Mid Buttocks 0.4 cm dark brown macule         Assessment & Plan   History of Dysplastic Nevi - No evidence of recurrence today - Recommend regular full body skin exams - Recommend daily broad spectrum sunscreen SPF 30+ to sun-exposed areas, reapply every 2 hours as needed.  - Call if any new or changing lesions are noted between office visits   Lentigines - Scattered tan macules - Due to sun exposure - Benign-appearing, observe - Recommend daily broad spectrum sunscreen SPF 30+ to sun-exposed areas, reapply every 2 hours as needed. - Call for any changes  Seborrheic Keratoses - Stuck-on, waxy, tan-brown papules and/or plaques  - Benign-appearing - Discussed benign etiology and prognosis. - Observe - Call  for any changes  Melanocytic Nevi - Tan-brown and/or pink-flesh-colored symmetric macules and papules - Benign appearing on exam today - Observation - Call clinic for new or changing moles - Recommend daily use of broad spectrum spf 30+ sunscreen to sun-exposed areas.   Hemangiomas - Red papules - Discussed benign nature - Observe - Call for any changes  Actinic Damage - Chronic condition, secondary to cumulative UV/sun exposure - diffuse scaly erythematous macules with underlying dyspigmentation - Recommend daily broad spectrum sunscreen SPF 30+ to sun-exposed areas, reapply every 2 hours as needed.  - Staying in the shade or wearing long sleeves, sun glasses (UVA+UVB protection) and wide brim hats (4-inch brim around the entire circumference of the hat) are also recommended for sun protection.  - Call for new or changing lesions.  Skin cancer screening performed today.  Keratosis Pilaris - Tiny follicular keratotic papules - Benign. Genetic in nature. No cure. - Observe. - If desired, patient can use an emollient (moisturizer) containing ammonium lactate, urea or salicylic acid once a day to smooth the area  Psoriasis Elbows With PsA - Chronic and persistent condition with duration or expected duration over one year. Condition is symptomatic/ bothersome to patient. Not currently at goal. Psoriasis is a chronic non-curable, but treatable genetic/hereditary disease that may have other systemic features affecting other organ systems such as joints (Psoriatic Arthritis). It is associated with an increased risk of inflammatory bowel disease, heart disease, non-alcoholic fatty liver disease, and depression.    Continue Vtama cream as needed.  Continue Cosentyx per her rheumatologist.  Related Medications Tapinarof (VTAMA) 1 % CREA Apply 1 application  topically daily. Apply to aa's psoriasis QD PRN.  Neoplasm of skin Right Mid Buttocks Epidermal / dermal shaving  Lesion  diameter (cm):  0.4 Informed consent: discussed and consent obtained   Timeout: patient name, date of birth, surgical site, and procedure verified   Procedure prep:  Patient was prepped and draped in usual sterile fashion Prep type:  Isopropyl alcohol Anesthesia: the lesion was anesthetized in a standard fashion   Anesthetic:  1% lidocaine w/ epinephrine 1-100,000 buffered w/ 8.4% NaHCO3 Instrument used: flexible razor blade   Hemostasis achieved with: pressure, aluminum chloride and electrodesiccation   Outcome: patient tolerated procedure well   Post-procedure details: sterile dressing applied and wound care instructions given   Dressing type: bandage and petrolatum    Specimen 1 - Surgical pathology Differential Diagnosis: R/O Atypia Check Margins: No  Return in about 6 months (around 01/29/2023) for TBSE, Psoriasis Follow Up.  I, Emelia Salisbury, CMA, am acting as scribe for Sarina Ser, MD. Documentation: I have reviewed the above documentation for accuracy and completeness, and I agree with the above.  Sarina Ser, MD

## 2022-07-30 NOTE — Patient Instructions (Addendum)
Wound Care Instructions  Cleanse wound gently with soap and water once a day then pat dry with clean gauze. Apply a thin coat of Petrolatum (petroleum jelly, "Vaseline") over the wound (unless you have an allergy to this). We recommend that you use a new, sterile tube of Vaseline. Do not pick or remove scabs. Do not remove the yellow or white "healing tissue" from the base of the wound.  Cover the wound with fresh, clean, nonstick gauze and secure with paper tape. You may use Band-Aids in place of gauze and tape if the wound is small enough, but would recommend trimming much of the tape off as there is often too much. Sometimes Band-Aids can irritate the skin.  You should call the office for your biopsy report after 1 week if you have not already been contacted.  If you experience any problems, such as abnormal amounts of bleeding, swelling, significant bruising, significant pain, or evidence of infection, please call the office immediately.  FOR ADULT SURGERY PATIENTS: If you need something for pain relief you may take 1 extra strength Tylenol (acetaminophen) AND 2 Ibuprofen ('200mg'$  each) together every 4 hours as needed for pain. (do not take these if you are allergic to them or if you have a reason you should not take them.) Typically, you may only need pain medication for 1 to 3 days.       Recommend daily broad spectrum sunscreen SPF 30+ to sun-exposed areas, reapply every 2 hours as needed. Call for new or changing lesions.  Staying in the shade or wearing long sleeves, sun glasses (UVA+UVB protection) and wide brim hats (4-inch brim around the entire circumference of the hat) are also recommended for sun protection.    Melanoma ABCDEs  Melanoma is the most dangerous type of skin cancer, and is the leading cause of death from skin disease.  You are more likely to develop melanoma if you: Have light-colored skin, light-colored eyes, or red or blond hair Spend a lot of time in the sun Tan  regularly, either outdoors or in a tanning bed Have had blistering sunburns, especially during childhood Have a close family member who has had a melanoma Have atypical moles or large birthmarks  Early detection of melanoma is key since treatment is typically straightforward and cure rates are extremely high if we catch it early.   The first sign of melanoma is often a change in a mole or a new dark spot.  The ABCDE system is a way of remembering the signs of melanoma.  A for asymmetry:  The two halves do not match. B for border:  The edges of the growth are irregular. C for color:  A mixture of colors are present instead of an even brown color. D for diameter:  Melanomas are usually (but not always) greater than 39m - the size of a pencil eraser. E for evolution:  The spot keeps changing in size, shape, and color.  Please check your skin once per month between visits. You can use a small mirror in front and a large mirror behind you to keep an eye on the back side or your body.   If you see any new or changing lesions before your next follow-up, please call to schedule a visit.  Please continue daily skin protection including broad spectrum sunscreen SPF 30+ to sun-exposed areas, reapplying every 2 hours as needed when you're outdoors.   Staying in the shade or wearing long sleeves, sun glasses (UVA+UVB protection) and  wide brim hats (4-inch brim around the entire circumference of the hat) are also recommended for sun protection.     Due to recent changes in healthcare laws, you may see results of your pathology and/or laboratory studies on MyChart before the doctors have had a chance to review them. We understand that in some cases there may be results that are confusing or concerning to you. Please understand that not all results are received at the same time and often the doctors may need to interpret multiple results in order to provide you with the best plan of care or course of  treatment. Therefore, we ask that you please give [place] 2 business days to thoroughly review all your results before contacting the office for clarification. Should we see a critical lab result, you will be contacted sooner.   If You Need Anything After Your Visit  If you have any questions or concerns for your doctor, please call our main line at [phone] and press option 4 to reach your doctor's medical assistant. If no one answers, please leave a voicemail as directed and we will return your call as soon as possible. Messages left after 4 pm will be answered the following business day.   You may also send [place] a message via [place]. We typically respond to MyChart messages within 1-2 business days.  For prescription refills, please ask your pharmacy to contact our office. Our fax number is [phone].  If you have an urgent issue when the clinic is closed that cannot wait until the next business day, you can [place] your doctor at the number below.    Please note that while we do our best to be available for urgent issues outside of office hours, we are not available 24/7.   If you have an urgent issue and are unable to reach [place], you may choose to seek medical care at your doctor's office, retail clinic, urgent care center, or emergency room.  If you have a medical emergency, please immediately call 911 or go to the emergency department.  Pager Numbers  - Dr. Nehemiah Massed: [phone]  - Dr. Laurence Ferrari: [phone]  - Dr. Nicole Kindred: [phone]  In the event of inclement weather, please call our main line at [phone] for an update on the status of any delays or closures.  Dermatology Medication Tips: Please keep the boxes that topical medications come in in order to help keep track of the instructions about where and how to use these. Pharmacies typically print the medication instructions only on the boxes and not directly on the medication tubes.   If your medication is too expensive,  please contact our office at [phone] option 4 or send [place] a message through [place].   We are unable to tell what your co-pay for medications will be in advance as this is different depending on your insurance coverage. However, we may be able to find a substitute medication at lower cost or fill out paperwork to get insurance to cover a needed medication.   If a prior authorization is required to get your medication covered by your insurance company, please allow [place] 1-2 business days to complete this process.  Drug prices often vary depending on where the prescription is filled and some pharmacies may offer cheaper prices.  The website www.goodrx.com contains coupons for medications through different pharmacies. The prices here do not account for what the cost may be with help from insurance (it may be cheaper with your insurance), but the website can  give you the price if you did not use any insurance.  - You can print the associated coupon and take it with your prescription to the pharmacy.  - You may also stop by our office during regular business hours and pick up a GoodRx coupon card.  - If you need your prescription sent electronically to a different pharmacy, notify our office through [facility] or by phone at [phone] option 4.     Si Usted Necesita Algo Despus de Su Visita  Tambin puede enviarnos un mensaje a travs de Pharmacist, community. Por lo general respondemos a los mensajes de MyChart en el transcurso de 1 a 2 das hbiles.  Para renovar recetas, por favor pida a su farmacia que se ponga en contacto con nuestra oficina. Harland Dingwall de fax es [place] [phone].  Si tiene un asunto urgente cuando la clnica est cerrada y que no puede esperar hasta el siguiente da hbil, puede llamar/localizar a su doctor(a) al nmero que aparece a continuacin.   Por favor, tenga en cuenta que aunque hacemos todo lo posible para estar disponibles para asuntos urgentes fuera del  horario de [place], no estamos disponibles las 24 horas del da, los 7 das de la [place].   Si tiene un problema urgente y no puede comunicarse con nosotros, puede optar por buscar atencin mdica  en el consultorio de su doctor(a), en una clnica privada, en un centro de atencin urgente o en una sala de emergencias.  Si tiene Engineering geologist, por favor llame inmediatamente al 911 o vaya a la sala de emergencias.  Nmeros de bper  - Dr. Nehemiah Massed: [phone]  - Dra. Moye: [phone]  - Dra. Nicole Kindred: [phone]  En caso de inclemencias del [place], por favor llame a Johnsie Kindred principal al [phone] para una actualizacin sobre el [place] de cualquier retraso o cierre.  Consejos para la medicacin en dermatologa: Por favor, guarde las cajas en las que vienen los medicamentos de uso tpico para ayudarle a seguir las instrucciones sobre dnde y cmo usarlos. Las farmacias generalmente imprimen las instrucciones del medicamento slo en las cajas y no directamente en los tubos del [place].   Si su medicamento es muy caro, por favor, pngase en contacto con Zigmund Daniel llamando al [phone] y presione la opcin 4 o envenos un mensaje a travs de Pharmacist, community.   No podemos decirle cul ser su copago por los medicamentos por adelantado ya que esto es diferente dependiendo de la cobertura de su seguro. Sin embargo, es posible que podamos encontrar un medicamento sustituto a Electrical engineer un formulario para que el seguro cubra el medicamento que se considera necesario.   Si se requiere una autorizacin previa para que su compaa de seguros [place] su medicamento, por favor permtanos de 1 a 2 das hbiles para completar este proceso.  Los precios de los medicamentos varan con frecuencia dependiendo del Environmental consultant de dnde se surte la receta y alguna farmacias pueden ofrecer precios ms baratos.  El sitio web www.goodrx.com tiene cupones para medicamentos de Office manager. Los precios aqu no tienen en cuenta lo que podra costar con la ayuda del seguro (puede ser ms barato con su seguro), pero el sitio web puede darle el precio si no utiliz Research scientist (physical sciences).  - Puede imprimir el cupn correspondiente y llevarlo con su receta a la farmacia.  - Tambin puede pasar por nuestra oficina durante el horario de atencin regular y Charity fundraiser una tarjeta de cupones de GoodRx.  -  Si necesita que su receta se enve electrnicamente a una farmacia diferente, informe a nuestra oficina a travs de MyChart de [place] o por telfono llamando al [phone] y presione la opcin 4.  

## 2022-08-08 ENCOUNTER — Encounter: Payer: Self-pay | Admitting: Dermatology

## 2022-08-11 ENCOUNTER — Other Ambulatory Visit: Payer: Self-pay

## 2022-08-12 ENCOUNTER — Other Ambulatory Visit: Payer: Self-pay

## 2022-08-17 ENCOUNTER — Telehealth: Payer: Self-pay

## 2022-08-17 NOTE — Telephone Encounter (Signed)
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

## 2022-08-17 NOTE — Telephone Encounter (Signed)
Advised pt of bx result and scheduled for surgery.

## 2022-08-18 ENCOUNTER — Other Ambulatory Visit: Payer: Self-pay

## 2022-08-21 ENCOUNTER — Other Ambulatory Visit: Payer: Self-pay

## 2022-08-24 ENCOUNTER — Other Ambulatory Visit: Payer: Self-pay

## 2022-08-24 MED ORDER — CELECOXIB 100 MG PO CAPS
100.0000 mg | ORAL_CAPSULE | Freq: Every day | ORAL | 2 refills | Status: DC | PRN
  Filled 2022-08-24: qty 30, 30d supply, fill #0
  Filled 2022-10-01: qty 30, 30d supply, fill #1

## 2022-09-04 ENCOUNTER — Other Ambulatory Visit: Payer: Self-pay

## 2022-09-08 ENCOUNTER — Other Ambulatory Visit: Payer: Self-pay

## 2022-09-08 ENCOUNTER — Encounter: Payer: Self-pay | Admitting: Dermatology

## 2022-09-08 ENCOUNTER — Telehealth: Payer: Self-pay

## 2022-09-08 ENCOUNTER — Ambulatory Visit: Payer: BC Managed Care – PPO | Admitting: Dermatology

## 2022-09-08 VITALS — BP 135/93 | HR 100

## 2022-09-08 DIAGNOSIS — D235 Other benign neoplasm of skin of trunk: Secondary | ICD-10-CM | POA: Diagnosis not present

## 2022-09-08 DIAGNOSIS — D239 Other benign neoplasm of skin, unspecified: Secondary | ICD-10-CM

## 2022-09-08 MED ORDER — MUPIROCIN 2 % EX OINT
1.0000 | TOPICAL_OINTMENT | Freq: Every day | CUTANEOUS | 0 refills | Status: DC
  Filled 2022-09-08: qty 22, 30d supply, fill #0

## 2022-09-08 NOTE — Telephone Encounter (Signed)
Patient doing fine after today's surgery.Kelsey Randolph

## 2022-09-08 NOTE — Progress Notes (Signed)
   Follow-Up Visit   Subjective  Kelsey Randolph is a 47 y.o. female who presents for the following: Severe dysplastic nevus bx proven (R mid buttocks, pt presents for excision).  The following portions of the chart were reviewed this encounter and updated as appropriate:   Tobacco  Allergies  Meds  Problems  Med Hx  Surg Hx  Fam Hx     Review of Systems:  No other skin or systemic complaints except as noted in HPI or Assessment and Plan.  Objective  Well appearing patient in no apparent distress; mood and affect are within normal limits.  A focused examination was performed including buttocks. Relevant physical exam findings are noted in the Assessment and Plan.  R mid buttocks Pink bx site 1.2 x 1.0cm   Assessment & Plan  Dysplastic nevus R mid buttocks Severe, bx proven, excised today Start Mupirocin oint qd to excision site  Skin excision - R mid buttocks  Lesion length (cm):  1.2 Lesion width (cm):  1 Margin per side (cm):  0.2 Total excision diameter (cm):  1.6 Informed consent: discussed and consent obtained   Timeout: patient name, date of birth, surgical site, and procedure verified   Procedure prep:  Patient was prepped and draped in usual sterile fashion Prep type:  Isopropyl alcohol and povidone-iodine Anesthesia: the lesion was anesthetized in a standard fashion   Anesthetic:  1% lidocaine w/ epinephrine 1-100,000 buffered w/ 8.4% NaHCO3 (9cc lido w/ epi, 6cc bupivicaine, Total of 15cc) Instrument used: #15 blade   Hemostasis achieved with: pressure   Hemostasis achieved with comment:  Electrocautery Outcome: patient tolerated procedure well with no complications   Post-procedure details: sterile dressing applied and wound care instructions given   Dressing type: bandage, pressure dressing and bacitracin (Mupirocin)    Skin repair - R mid buttocks Complexity:  Complex Final length (cm):  6 Reason for type of repair: reduce tension to allow closure,  reduce the risk of dehiscence, infection, and necrosis, reduce subcutaneous dead space and avoid a hematoma, allow closure of the large defect, preserve normal anatomy, preserve normal anatomical and functional relationships and enhance both functionality and cosmetic results   Undermining: area extensively undermined   Undermining comment:  Undermining Defect 1.6cm Subcutaneous layers (deep stitches):  Suture size:  2-0 Suture type: Vicryl (polyglactin 910)   Subcutaneous suture technique: Inverted Dermal. Fine/surface layer approximation (top stitches):  Suture size:  2-0 Suture type: nylon   Stitches: simple running   Suture removal (days):  7 Hemostasis achieved with: pressure Outcome: patient tolerated procedure well with no complications   Post-procedure details: sterile dressing applied and wound care instructions given   Dressing type: bandage, pressure dressing and bacitracin (Mupirocin)    mupirocin ointment (BACTROBAN) 2 % - R mid buttocks Apply 1 Application topically daily to excision site  Specimen 1 - Surgical pathology Differential Diagnosis: D48.5 Bx proven severe dysplastic nevus  Check Margins: yes Pink bx site 1.2 x 1.0cm [id number]  Return in about 1 week (around 09/15/2022) for suture removal.  I, Kelsey Randolph, RMA, am acting as scribe for Sarina Ser, MD . Documentation: I have reviewed the above documentation for accuracy and completeness, and I agree with the above.  Sarina Ser, MD

## 2022-09-08 NOTE — Patient Instructions (Addendum)

## 2022-09-11 ENCOUNTER — Encounter: Payer: Self-pay | Admitting: Dermatology

## 2022-09-15 ENCOUNTER — Ambulatory Visit (INDEPENDENT_AMBULATORY_CARE_PROVIDER_SITE_OTHER): Payer: BC Managed Care – PPO | Admitting: Dermatology

## 2022-09-15 DIAGNOSIS — D235 Other benign neoplasm of skin of trunk: Secondary | ICD-10-CM

## 2022-09-15 DIAGNOSIS — Z4802 Encounter for removal of sutures: Secondary | ICD-10-CM

## 2022-09-15 DIAGNOSIS — D239 Other benign neoplasm of skin, unspecified: Secondary | ICD-10-CM

## 2022-09-15 NOTE — Patient Instructions (Signed)
Due to recent changes in healthcare laws, you may see results of your pathology and/or laboratory studies on MyChart before the doctors have had a chance to review them. We understand that in some cases there may be results that are confusing or concerning to you. Please understand that not all results are received at the same time and often the doctors may need to interpret multiple results in order to provide you with the best plan of care or course of treatment. Therefore, we ask that you please give us 2 business days to thoroughly review all your results before contacting the office for clarification. Should we see a critical lab result, you will be contacted sooner.   If You Need Anything After Your Visit  If you have any questions or concerns for your doctor, please call our main line at [phone] and press option 4 to reach your doctor's medical assistant. If no one answers, please leave a voicemail as directed and we will return your call as soon as possible. Messages left after 4 pm will be answered the following business day.   You may also send us a message via MyChart. We typically respond to MyChart messages within 1-2 business days.  For prescription refills, please ask your pharmacy to contact our office. Our fax number is [phone].  If you have an urgent issue when the clinic is closed that cannot wait until the next business day, you can [place] your doctor at the number below.    Please note that while we do our best to be available for urgent issues outside of office hours, we are not available 24/7.   If you have an urgent issue and are unable to reach us, you may choose to seek medical care at your doctor's office, retail clinic, urgent care center, or emergency room.  If you have a medical emergency, please immediately call 911 or go to the emergency department.  Pager Numbers  - Dr. Kowalski: [phone]  - Dr. Moye: [phone]  - Dr. Stewart:  [phone]  In the event of inclement weather, please call our main line at [phone] for an update on the status of any delays or closures.  Dermatology Medication Tips: Please keep the boxes that topical medications come in in order to help keep track of the instructions about where and how to use these. Pharmacies typically print the medication instructions only on the boxes and not directly on the medication tubes.   If your medication is too expensive, please contact our office at [phone] option 4 or send us a message through MyChart.   We are unable to tell what your co-pay for medications will be in advance as this is different depending on your insurance coverage. However, we may be able to find a substitute medication at lower cost or fill out paperwork to get insurance to cover a needed medication.   If a prior authorization is required to get your medication covered by your insurance company, please allow us 1-2 business days to complete this process.  Drug prices often vary depending on where the prescription is filled and some pharmacies may offer cheaper prices.  The website www.goodrx.com contains coupons for medications through different pharmacies. The prices here do not account for what the cost may be with help from insurance (it may be cheaper with your insurance), but the website can give you the price if you did not use any insurance.  - You can print the associated coupon and take it with   your prescription to the pharmacy.  - You may also stop by our office during regular business hours and pick up a GoodRx coupon card.  - If you need your prescription sent electronically to a different pharmacy, notify our office through [facility] MyChart or by phone at [phone] option 4.     Si Usted Necesita Algo Despus de Su Visita  Tambin puede enviarnos un mensaje a travs de MyChart. Por lo general respondemos a los mensajes de MyChart en el transcurso de 1 a 2  das hbiles.  Para renovar recetas, por favor pida a su farmacia que se ponga en contacto con nuestra oficina. Nuestro nmero de fax es el [phone].  Si tiene un asunto urgente cuando la clnica est cerrada y que no puede esperar hasta el siguiente da hbil, puede llamar/localizar a su doctor(a) al nmero que aparece a continuacin.   Por favor, tenga en cuenta que aunque hacemos todo lo posible para estar disponibles para asuntos urgentes fuera del horario de oficina, no estamos disponibles las 24 horas del da, los 7 das de la semana.   Si tiene un problema urgente y no puede comunicarse con nosotros, puede optar por buscar atencin mdica  en el consultorio de su doctor(a), en una clnica privada, en un centro de atencin urgente o en una sala de emergencias.  Si tiene una emergencia mdica, por favor llame inmediatamente al 911 o vaya a la sala de emergencias.  Nmeros de bper  - Dr. Kowalski: [phone]  - Dra. Moye: [phone]  - Dra. Stewart: [phone]  En caso de inclemencias del tiempo, por favor llame a nuestra lnea principal al [phone] para una actualizacin sobre el estado de cualquier retraso o cierre.  Consejos para la medicacin en dermatologa: Por favor, guarde las cajas en las que vienen los medicamentos de uso tpico para ayudarle a seguir las instrucciones sobre dnde y cmo usarlos. Las farmacias generalmente imprimen las instrucciones del medicamento slo en las cajas y no directamente en los tubos del medicamento.   Si su medicamento es muy caro, por favor, pngase en contacto con nuestra oficina llamando al [phone] y presione la opcin 4 o envenos un mensaje a travs de MyChart.   No podemos decirle cul ser su copago por los medicamentos por adelantado ya que esto es diferente dependiendo de la cobertura de su seguro. Sin embargo, es posible que podamos encontrar un medicamento sustituto a menor costo o llenar un formulario para que el  seguro cubra el medicamento que se considera necesario.   Si se requiere una autorizacin previa para que su compaa de seguros cubra su medicamento, por favor permtanos de 1 a 2 das hbiles para completar este proceso.  Los precios de los medicamentos varan con frecuencia dependiendo del lugar de dnde se surte la receta y alguna farmacias pueden ofrecer precios ms baratos.  El sitio web www.goodrx.com tiene cupones para medicamentos de diferentes farmacias. Los precios aqu no tienen en cuenta lo que podra costar con la ayuda del seguro (puede ser ms barato con su seguro), pero el sitio web puede darle el precio si no utiliz ningn seguro.  - Puede imprimir el cupn correspondiente y llevarlo con su receta a la farmacia.  - Tambin puede pasar por nuestra oficina durante el horario de atencin regular y recoger una tarjeta de cupones de GoodRx.  - Si necesita que su receta se enve electrnicamente a una farmacia diferente, informe a nuestra oficina a travs de MyChart de    o por telfono llamando al [phone] y presione la opcin 4.  

## 2022-09-15 NOTE — Progress Notes (Unsigned)
   Follow-Up Visit   Subjective  Kelsey Randolph is a 47 y.o. female who presents for the following: Post op/suture removal (Bx proven severely dysplastic nevus of the R med buttocks, patient is here today for suture removal ).  The following portions of the chart were reviewed this encounter and updated as appropriate:   Tobacco  Allergies  Meds  Problems  Med Hx  Surg Hx  Fam Hx     Review of Systems:  No other skin or systemic complaints except as noted in HPI or Assessment and Plan.  Objective  Well appearing patient in no apparent distress; mood and affect are within normal limits.  A focused examination was performed including the face and buttocks. Relevant physical exam findings are noted in the Assessment and Plan.  R mid buttock Healing excision site.    Assessment & Plan  Severe Dysplastic nevus - S/P Excision R mid buttock Encounter for Removal of Sutures - Incision site at the R mid buttock is clean, dry and intact - Wound cleansed, sutures removed, wound cleansed and steri strips applied.  - Will contact patient with pathology results when available. - Patient advised to keep steri-strips dry until they fall off. - Scars remodel for a full year. - Once steri-strips fall off, patient can apply over-the-counter silicone scar cream each night to help with scar remodeling if desired. - Patient advised to call with any concerns or if they notice any new or changing lesions.  Related Medications mupirocin ointment (BACTROBAN) 2 % Apply 1 Application topically daily to excision site  Return for appointment as scheduled.  Luther Redo, CMA, am acting as scribe for Sarina Ser, MD . Documentation: I have reviewed the above documentation for accuracy and completeness, and I agree with the above.  Sarina Ser, MD

## 2022-09-16 ENCOUNTER — Other Ambulatory Visit: Payer: Self-pay

## 2022-09-16 ENCOUNTER — Encounter: Payer: Self-pay | Admitting: Dermatology

## 2022-09-16 ENCOUNTER — Telehealth: Payer: Self-pay

## 2022-09-16 NOTE — Telephone Encounter (Signed)
-----   Message from Ralene Bathe, MD sent at 09/16/2022 12:32 PM EST ----- Diagnosis Skin (M), right mid buttocks NO RESIDUAL DYSPLASTIC NEVUS, MARGINS FREE  Severe dysplastic Margins free

## 2022-09-16 NOTE — Telephone Encounter (Signed)
Left voicemail for patient to return my call. 

## 2022-09-23 NOTE — Telephone Encounter (Signed)
Advised pt of bx results/sh ?

## 2022-09-28 ENCOUNTER — Other Ambulatory Visit: Payer: Self-pay

## 2022-09-28 MED ORDER — METHYLPREDNISOLONE 4 MG PO TBPK
ORAL_TABLET | ORAL | 0 refills | Status: AC
  Filled 2022-09-28: qty 21, 6d supply, fill #0

## 2022-09-29 ENCOUNTER — Other Ambulatory Visit: Payer: Self-pay

## 2022-10-01 ENCOUNTER — Other Ambulatory Visit: Payer: Self-pay

## 2022-10-02 ENCOUNTER — Other Ambulatory Visit: Payer: Self-pay

## 2022-10-09 ENCOUNTER — Other Ambulatory Visit: Payer: Self-pay | Admitting: Orthopedic Surgery

## 2022-10-13 ENCOUNTER — Other Ambulatory Visit: Payer: Self-pay

## 2022-10-13 MED ORDER — WEGOVY 0.5 MG/0.5ML ~~LOC~~ SOAJ
0.5000 mg | SUBCUTANEOUS | 0 refills | Status: DC
  Filled 2022-10-13: qty 2, 28d supply, fill #0

## 2022-10-14 ENCOUNTER — Other Ambulatory Visit: Payer: Self-pay

## 2022-10-14 MED ORDER — HYDROCHLOROTHIAZIDE 25 MG PO TABS
25.0000 mg | ORAL_TABLET | Freq: Every day | ORAL | 5 refills | Status: DC
  Filled 2022-10-14: qty 30, 30d supply, fill #0
  Filled 2022-11-27: qty 30, 30d supply, fill #1
  Filled 2022-12-28: qty 30, 30d supply, fill #2
  Filled 2023-02-12: qty 30, 30d supply, fill #3
  Filled 2023-03-17: qty 30, 30d supply, fill #4
  Filled 2023-04-23: qty 30, 30d supply, fill #5

## 2022-10-14 MED ORDER — KETOCONAZOLE 2 % EX CREA
1.0000 | TOPICAL_CREAM | Freq: Every day | CUTANEOUS | 1 refills | Status: DC
  Filled 2022-10-14: qty 120, 90d supply, fill #0

## 2022-10-19 ENCOUNTER — Encounter
Admission: RE | Admit: 2022-10-19 | Discharge: 2022-10-19 | Disposition: A | Discharge status: Home / Self Care | Payer: BC Managed Care – PPO | Source: Ambulatory Visit | Attending: Orthopedic Surgery | Admitting: Orthopedic Surgery

## 2022-10-19 VITALS — Ht 63.0 in | Wt 270.0 lb

## 2022-10-19 DIAGNOSIS — Z01818 Encounter for other preprocedural examination: Secondary | ICD-10-CM

## 2022-10-19 DIAGNOSIS — Z01810 Encounter for preprocedural cardiovascular examination: Secondary | ICD-10-CM

## 2022-10-19 DIAGNOSIS — Z01812 Encounter for preprocedural laboratory examination: Secondary | ICD-10-CM

## 2022-10-19 DIAGNOSIS — Z79899 Other long term (current) drug therapy: Secondary | ICD-10-CM

## 2022-10-19 DIAGNOSIS — I10 Essential (primary) hypertension: Secondary | ICD-10-CM

## 2022-10-19 HISTORY — DX: Prediabetes: R73.03

## 2022-10-19 HISTORY — DX: Other complications of anesthesia, initial encounter: T88.59XA

## 2022-10-19 HISTORY — DX: Personal history of nicotine dependence: Z87.891

## 2022-10-19 HISTORY — DX: Tachycardia, unspecified: R00.0

## 2022-10-19 HISTORY — DX: Morbid (severe) obesity due to excess calories: E66.01

## 2022-10-19 NOTE — Patient Instructions (Addendum)
Your procedure is scheduled on:10-27-22 Tuesday Report to the Registration Desk on the 1st floor of the [facility].Then proceed to the 2nd floor Surgery Desk To find out your arrival time, please call [phone] between 1PM - 3PM on:10-26-23 Monday If your arrival time is 6:00 am, do not arrive before that time as the [facility] entrance doors do not open until 6:00 am.  REMEMBER: Instructions that are not followed completely may result in serious medical risk, up to and including death; or upon the discretion of your surgeon and anesthesiologist your surgery may need to be rescheduled.  Do not eat food after midnight the night before surgery.  No gum chewing or hard candies.  You may however, drink CLEAR liquids up to 2 hours before you are scheduled to arrive for your surgery. Do not drink anything within 2 hours of your scheduled arrival time.  Clear liquids include: - water  - apple juice without pulp - gatorade (not RED colors) - black coffee or tea (Do NOT add milk or creamers to the coffee or tea) Do NOT drink anything that is not on this list.  In addition, your doctor has ordered for you to drink the provided:  Ensure Pre-Surgery Clear Carbohydrate Drink  Drinking this carbohydrate drink up to two hours before surgery helps to reduce insulin resistance and improve patient outcomes. Please complete drinking 2 hours before scheduled arrival time.  One week prior to surgery: Stop Anti-inflammatories (NSAIDS) such as Advil, Aleve, Ibuprofen, Motrin, Naproxen, Naprosyn and Aspirin based products such as Excedrin, Goody's Powder, BC Powder.You may however, continue to take Tylenol if needed for pain up until the day of surgery. You may continue your celecoxib (CELEBREX) up until the day prior to surgery  [place] supplements/vitamins NOW (10-19-22) until after surgery (Vitamin D3-K2, Turmeric and B Complex  TAKE ONLY THESE MEDICATIONS THE MORNING OF SURGERY WITH  A SIP OF WATER: -buPROPion (WELLBUTRIN XL)  -cetirizine (ZYRTEC)  -levothyroxine (SYNTHROID)  -potassium chloride (KLOR-CON)  -You may takeALPRAZolam Duanne Moron) if needed for anxiety  No Alcohol for 24 hours before or after surgery.  No Smoking including e-cigarettes for 24 hours before surgery.  No chewable tobacco products for at least 6 hours before surgery.  No nicotine patches on the day of surgery.  Do not use any "recreational" drugs for at least a week (preferably 2 weeks) before your surgery.  Please be advised that the combination of cocaine and anesthesia may have negative outcomes, up to and including death. If you test positive for cocaine, your surgery will be cancelled.  On the morning of surgery brush your teeth with toothpaste and water, you may rinse your mouth with mouthwash if you wish. Do not swallow any toothpaste or mouthwash.  Use CHG Soap as directed on instruction sheet.  Do not wear jewelry, make-up, hairpins, clips or nail polish.  Do not wear lotions, powders, or perfumes.   Do not shave body hair from the neck down 48 hours before surgery.  Contact lenses, hearing aids and dentures may not be worn into surgery.  Do not bring valuables to the hospital. [facility] is not responsible for any missing/lost belongings or valuables.   Notify your doctor if there is any change in your medical condition (cold, fever, infection).  Wear comfortable clothing (specific to your surgery type) to the hospital.  After surgery, you can help prevent lung complications by doing breathing exercises.  Take deep breaths and cough every 1-2  hours. Your doctor may order a device called an Incentive Spirometer to help you take deep breaths. When coughing or sneezing, hold a pillow firmly against your incision with both hands. This is called "splinting." Doing this helps protect your incision. It also decreases belly discomfort.  If you are being admitted to the hospital  overnight, leave your suitcase in the car. After surgery it may be brought to your room.  In case of increased patient census, it may be necessary for you, the patient, to continue your postoperative care in the Same Day Surgery department.  If you are being discharged the day of surgery, you will not be allowed to drive home. You will need a responsible individual to drive you home and stay with you for 24 hours after surgery.   If you are taking public transportation, you will need to have a responsible individual with you.  Please call the [facility] Dept. at [phone] if you have any questions about these instructions.  Surgery Visitation Policy:  Patients undergoing a surgery or procedure may have two family members or support persons with them as long as the person is not COVID-19 positive or experiencing its symptoms.   Due to an increase in RSV and influenza rates and associated hospitalizations, children ages 13 and under will not be able to visit patients in [facility]. Masks continue to be strongly recommended.     Preparing for Surgery with CHLORHEXIDINE GLUCONATE (CHG) Soap  Chlorhexidine Gluconate (CHG) Soap  o An antiseptic cleaner that kills germs and bonds with the skin to continue killing germs even after washing  o Used for showering the night before surgery and morning of surgery  Before surgery, you can play an important role by reducing the number of germs on your skin.  CHG (Chlorhexidine gluconate) soap is an antiseptic cleanser which kills germs and bonds with the skin to continue killing germs even after washing.  Please do not use if you have an allergy to CHG or antibacterial soaps. If your skin becomes reddened/irritated stop using the CHG.  1. Shower the NIGHT BEFORE SURGERY and the MORNING OF SURGERY with CHG soap.  2. If you choose to wash your hair, wash your hair first as usual with your normal shampoo.  3. After  shampooing, rinse your hair and body thoroughly to remove the shampoo.  4. Use CHG as you would any other liquid soap. You can apply CHG directly to the skin and wash gently with a scrungie or a clean washcloth.  5. Apply the CHG soap to your body only from the neck down. Do not use on open wounds or open sores. Avoid contact with your eyes, ears, mouth, and genitals (private parts). Wash face and genitals (private parts) with your normal soap.  6. Wash thoroughly, paying special attention to the area where your surgery will be performed.  7. Thoroughly rinse your body with warm water.  8. Do not shower/wash with your normal soap after using and rinsing off the CHG soap.  9. Pat yourself dry with a clean towel.  10. Wear clean pajamas to bed the night before surgery.  12. Place clean sheets on your bed the night of your first shower and do not sleep with pets.  13. Shower again with the CHG soap on the day of surgery prior to arriving at the hospital.  14. Do not apply any deodorants/lotions/powders.  15. Please wear clean clothes to the hospital.   How to  Use an Incentive Spirometer An incentive spirometer is a tool that measures how well you are filling your lungs with each breath. Learning to take long, deep breaths using this tool can help you keep your lungs clear and active. This may help to reverse or lessen your chance of developing breathing (pulmonary) problems, especially infection. You may be asked to use a spirometer: After a surgery. If you have a lung problem or a history of smoking. After a long period of time when you have been unable to move or be active. If the spirometer includes an indicator to show the highest number that you have reached, your health care provider or respiratory therapist will help you set a goal. Keep a log of your progress as told by your health care provider. What are the risks? Breathing too quickly may cause dizziness or cause you to pass  out. Take your time so you do not get dizzy or light-headed. If you are in pain, you may need to take pain medicine before doing incentive spirometry. It is harder to take a deep breath if you are having pain. How to use your incentive spirometer  Sit up on the edge of your bed or on a chair. Hold the incentive spirometer so that it is in an upright position. Before you use the spirometer, breathe out normally. Place the mouthpiece in your mouth. Make sure your lips are closed tightly around it. Breathe in slowly and as deeply as you can through your mouth, causing the piston or the ball to rise toward the top of the chamber. Hold your breath for 3-5 seconds, or for as long as possible. If the spirometer includes a coach indicator, use this to guide you in breathing. Slow down your breathing if the indicator goes above the marked areas. Remove the mouthpiece from your mouth and breathe out normally. The piston or ball will return to the bottom of the chamber. Rest for a few seconds, then repeat the steps 10 or more times. Take your time and take a few normal breaths between deep breaths so that you do not get dizzy or light-headed. Do this every 1-2 hours when you are awake. If the spirometer includes a goal marker to show the highest number you have reached (best effort), use this as a goal to work toward during each repetition. After each set of 10 deep breaths, cough a few times. This will help to make sure that your lungs are clear. If you have an incision on your chest or abdomen from surgery, place a pillow or a rolled-up towel firmly against the incision when you cough. This can help to reduce pain while taking deep breaths and coughing. General tips When you are able to get out of bed: Walk around often. Continue to take deep breaths and cough in order to clear your lungs. Keep using the incentive spirometer until your health care provider says it is okay to stop using it. If you have  been in the hospital, you may be told to keep using the spirometer at home. Contact a health care provider if: You are having difficulty using the spirometer. You have trouble using the spirometer as often as instructed. Your pain medicine is not giving enough relief for you to use the spirometer as told. You have a fever. Get help right away if: You develop shortness of breath. You develop a cough with bloody mucus from the lungs. You have fluid or blood coming from an incision site  after you cough. Summary An incentive spirometer is a tool that can help you learn to take long, deep breaths to keep your lungs clear and active. You may be asked to use a spirometer after a surgery, if you have a lung problem or a history of smoking, or if you have been inactive for a long period of time. Use your incentive spirometer as instructed every 1-2 hours while you are awake. If you have an incision on your chest or abdomen, place a pillow or a rolled-up towel firmly against your incision when you cough. This will help to reduce pain. Get help right away if you have shortness of breath, you cough up bloody mucus, or blood comes from your incision when you cough. This information is not intended to replace advice given to you by your health care provider. Make sure you discuss any questions you have with your health care provider. Document Revised: 10/16/2019 Document Reviewed: 10/16/2019 Elsevier Patient Education  [employer].

## 2022-10-20 ENCOUNTER — Encounter
Admission: RE | Admit: 2022-10-20 | Discharge: 2022-10-20 | Disposition: A | Discharge status: Home / Self Care | Payer: BC Managed Care – PPO | Source: Ambulatory Visit | Attending: Orthopedic Surgery | Admitting: Orthopedic Surgery

## 2022-10-20 DIAGNOSIS — Z79899 Other long term (current) drug therapy: Secondary | ICD-10-CM | POA: Insufficient documentation

## 2022-10-20 DIAGNOSIS — Z01818 Encounter for other preprocedural examination: Secondary | ICD-10-CM | POA: Insufficient documentation

## 2022-10-20 DIAGNOSIS — Z01810 Encounter for preprocedural cardiovascular examination: Secondary | ICD-10-CM

## 2022-10-20 DIAGNOSIS — I10 Essential (primary) hypertension: Secondary | ICD-10-CM | POA: Insufficient documentation

## 2022-10-20 DIAGNOSIS — Z01812 Encounter for preprocedural laboratory examination: Secondary | ICD-10-CM

## 2022-10-20 LAB — BASIC METABOLIC PANEL
Anion gap: 8 (ref 5–15)
BUN: 14 mg/dL (ref 6–20)
CO2: 27 mmol/L (ref 22–32)
Calcium: 8.4 mg/dL — ABNORMAL LOW (ref 8.9–10.3)
Chloride: 101 mmol/L (ref 98–111)
Creatinine, Ser: 0.71 mg/dL (ref 0.44–1.00)
GFR, Estimated: >60 mL/min (ref >60)
Glucose, Bld: 106 mg/dL — ABNORMAL HIGH (ref 70–99)
Potassium: 3.5 mmol/L (ref 3.5–5.1)
Sodium: 136 mmol/L (ref 135–145)

## 2022-10-27 ENCOUNTER — Ambulatory Visit: Payer: BC Managed Care – PPO | Admitting: Urgent Care

## 2022-10-27 ENCOUNTER — Other Ambulatory Visit: Payer: Self-pay

## 2022-10-27 ENCOUNTER — Encounter
Admission: RE | Disposition: A | Discharge status: Home / Self Care | Payer: Self-pay | Source: Home / Self Care | Attending: Orthopedic Surgery

## 2022-10-27 ENCOUNTER — Ambulatory Visit: Payer: BC Managed Care – PPO

## 2022-10-27 ENCOUNTER — Ambulatory Visit
Admission: RE | Admit: 2022-10-27 | Discharge: 2022-10-27 | Disposition: A | Discharge status: Home / Self Care | Payer: BC Managed Care – PPO | Attending: Orthopedic Surgery | Admitting: Orthopedic Surgery

## 2022-10-27 ENCOUNTER — Encounter: Payer: Self-pay | Admitting: Orthopedic Surgery

## 2022-10-27 ENCOUNTER — Ambulatory Visit: Payer: BC Managed Care – PPO | Admitting: Anesthesiology

## 2022-10-27 DIAGNOSIS — M7552 Bursitis of left shoulder: Secondary | ICD-10-CM | POA: Diagnosis not present

## 2022-10-27 DIAGNOSIS — E119 Type 2 diabetes mellitus without complications: Secondary | ICD-10-CM | POA: Diagnosis not present

## 2022-10-27 DIAGNOSIS — K219 Gastro-esophageal reflux disease without esophagitis: Secondary | ICD-10-CM | POA: Diagnosis not present

## 2022-10-27 DIAGNOSIS — Z01818 Encounter for other preprocedural examination: Secondary | ICD-10-CM

## 2022-10-27 DIAGNOSIS — E039 Hypothyroidism, unspecified: Secondary | ICD-10-CM | POA: Diagnosis not present

## 2022-10-27 DIAGNOSIS — M7522 Bicipital tendinitis, left shoulder: Secondary | ICD-10-CM | POA: Diagnosis not present

## 2022-10-27 DIAGNOSIS — M19012 Primary osteoarthritis, left shoulder: Secondary | ICD-10-CM | POA: Insufficient documentation

## 2022-10-27 DIAGNOSIS — F32A Depression, unspecified: Secondary | ICD-10-CM | POA: Insufficient documentation

## 2022-10-27 DIAGNOSIS — F419 Anxiety disorder, unspecified: Secondary | ICD-10-CM | POA: Insufficient documentation

## 2022-10-27 DIAGNOSIS — Z6841 Body Mass Index (BMI) 40.0 and over, adult: Secondary | ICD-10-CM | POA: Insufficient documentation

## 2022-10-27 DIAGNOSIS — Z87891 Personal history of nicotine dependence: Secondary | ICD-10-CM | POA: Insufficient documentation

## 2022-10-27 HISTORY — PX: SHOULDER ARTHROSCOPY W/ SUBACROMIAL DECOMPRESSION AND DISTAL CLAVICLE EXCISION: SHX2401

## 2022-10-27 LAB — POCT PREGNANCY, URINE: Preg Test, Ur: NEGATIVE

## 2022-10-27 SURGERY — SHOULDER ARTHROSCOPY WITH SUBACROMIAL DECOMPRESSION AND DISTAL CLAVICLE EXCISION
Anesthesia: General | Site: Shoulder | Laterality: Left

## 2022-10-27 MED ORDER — IBUPROFEN 800 MG PO TABS
800.0000 mg | ORAL_TABLET | Freq: Three times a day (TID) | ORAL | 0 refills | Status: AC
  Filled 2022-10-27: qty 42, 14d supply, fill #0

## 2022-10-27 MED ORDER — OXYCODONE HCL 5 MG PO TABS
ORAL_TABLET | ORAL | Status: AC
  Filled 2022-10-27: qty 1

## 2022-10-27 MED ORDER — CEFAZOLIN IN SODIUM CHLORIDE 3-0.9 GM/100ML-% IV SOLN
3.0000 g | INTRAVENOUS | Status: AC
  Administered 2022-10-27: 3 g via INTRAVENOUS
  Filled 2022-10-27: qty 100

## 2022-10-27 MED ORDER — ONDANSETRON HCL 4 MG/2ML IJ SOLN
INTRAMUSCULAR | Status: DC | PRN
  Administered 2022-10-27: 4 mg via INTRAVENOUS

## 2022-10-27 MED ORDER — EPINEPHRINE PF 1 MG/ML IJ SOLN
INTRAMUSCULAR | Status: AC
  Filled 2022-10-27: qty 4

## 2022-10-27 MED ORDER — LACTATED RINGERS IV SOLN: INTRAVENOUS | Status: DC

## 2022-10-27 MED ORDER — OXYCODONE HCL 5 MG PO TABS
5.0000 mg | ORAL_TABLET | Freq: Once | ORAL | Status: AC | PRN
  Administered 2022-10-27: 5 mg via ORAL

## 2022-10-27 MED ORDER — BUPIVACAINE LIPOSOME 1.3 % IJ SUSP
INTRAMUSCULAR | Status: DC | PRN
  Administered 2022-10-27: 20 mL via PERINEURAL

## 2022-10-27 MED ORDER — BUPIVACAINE HCL (PF) 0.5 % IJ SOLN
INTRAMUSCULAR | Status: DC | PRN
  Administered 2022-10-27: 10 mL via PERINEURAL

## 2022-10-27 MED ORDER — KETAMINE HCL 10 MG/ML IJ SOLN
INTRAMUSCULAR | Status: DC | PRN
  Administered 2022-10-27: 30 mg via INTRAVENOUS
  Administered 2022-10-27: 20 mg via INTRAVENOUS

## 2022-10-27 MED ORDER — ONDANSETRON 4 MG PO TBDP: 4.0000 mg | ORAL_TABLET | Freq: Three times a day (TID) | ORAL | 0 refills | Status: DC | PRN

## 2022-10-27 MED ORDER — FAMOTIDINE 20 MG PO TABS: 20.0000 mg | ORAL_TABLET | Freq: Once | ORAL | Status: AC

## 2022-10-27 MED ORDER — BUPIVACAINE LIPOSOME 1.3 % IJ SUSP
INTRAMUSCULAR | Status: AC
  Filled 2022-10-27: qty 10

## 2022-10-27 MED ORDER — CHLORHEXIDINE GLUCONATE 0.12 % MT SOLN
OROMUCOSAL | Status: AC
  Administered 2022-10-27: 15 mL
  Filled 2022-10-27: qty 15

## 2022-10-27 MED ORDER — KETAMINE HCL 50 MG/5ML IJ SOSY
PREFILLED_SYRINGE | INTRAMUSCULAR | Status: AC
  Filled 2022-10-27: qty 5

## 2022-10-27 MED ORDER — ACETAMINOPHEN 10 MG/ML IV SOLN: 1000.0000 mg | Freq: Once | INTRAVENOUS | Status: DC | PRN

## 2022-10-27 MED ORDER — ACETAMINOPHEN 500 MG PO TABS: 1000.0000 mg | ORAL_TABLET | Freq: Three times a day (TID) | ORAL | 2 refills | Status: DC

## 2022-10-27 MED ORDER — LACTATED RINGERS IR SOLN
Status: DC | PRN
  Administered 2022-10-27 (×4): 3001 mL

## 2022-10-27 MED ORDER — OXYCODONE HCL 5 MG PO TABS
5.0000 mg | ORAL_TABLET | ORAL | 0 refills | Status: DC | PRN
  Filled 2022-10-27: qty 20, 2d supply, fill #0

## 2022-10-27 MED ORDER — ROCURONIUM BROMIDE 100 MG/10ML IV SOLN
INTRAVENOUS | Status: DC | PRN
  Administered 2022-10-27: 50 mg via INTRAVENOUS

## 2022-10-27 MED ORDER — LACTATED RINGERS IR SOLN
Status: DC | PRN
  Administered 2022-10-27 (×2): 3000 mL

## 2022-10-27 MED ORDER — FAMOTIDINE 20 MG PO TABS
ORAL_TABLET | ORAL | Status: AC
  Administered 2022-10-27: 20 mg via ORAL
  Filled 2022-10-27: qty 1

## 2022-10-27 MED ORDER — FENTANYL CITRATE PF 50 MCG/ML IJ SOSY
PREFILLED_SYRINGE | INTRAMUSCULAR | Status: AC
  Administered 2022-10-27: 50 ug via INTRAVENOUS
  Filled 2022-10-27: qty 1

## 2022-10-27 MED ORDER — GLYCOPYRROLATE 0.2 MG/ML IJ SOLN
INTRAMUSCULAR | Status: DC | PRN
  Administered 2022-10-27: .2 mg via INTRAVENOUS

## 2022-10-27 MED ORDER — DEXMEDETOMIDINE HCL IN NACL 80 MCG/20ML IV SOLN
INTRAVENOUS | Status: DC | PRN
  Administered 2022-10-27: 8 ug via BUCCAL

## 2022-10-27 MED ORDER — BUPIVACAINE HCL (PF) 0.5 % IJ SOLN
INTRAMUSCULAR | Status: AC
  Filled 2022-10-27: qty 10

## 2022-10-27 MED ORDER — ONDANSETRON HCL 4 MG/2ML IJ SOLN
INTRAMUSCULAR | Status: AC
  Filled 2022-10-27: qty 2

## 2022-10-27 MED ORDER — PROPOFOL 10 MG/ML IV BOLUS
INTRAVENOUS | Status: DC | PRN
  Administered 2022-10-27: 150 mg via INTRAVENOUS
  Administered 2022-10-27: 100 ug/kg/min via INTRAVENOUS

## 2022-10-27 MED ORDER — OXYCODONE HCL 5 MG PO TABS: 5.0000 mg | ORAL_TABLET | ORAL | 0 refills | Status: DC | PRN

## 2022-10-27 MED ORDER — FENTANYL CITRATE (PF) 100 MCG/2ML IJ SOLN: 25.0000 ug | INTRAMUSCULAR | Status: DC | PRN

## 2022-10-27 MED ORDER — METOPROLOL TARTRATE 5 MG/5ML IV SOLN
INTRAVENOUS | Status: DC | PRN
  Administered 2022-10-27: 1 mg via INTRAVENOUS
  Administered 2022-10-27: 2 mg via INTRAVENOUS

## 2022-10-27 MED ORDER — PROPOFOL 1000 MG/100ML IV EMUL
INTRAVENOUS | Status: AC
  Filled 2022-10-27: qty 100

## 2022-10-27 MED ORDER — MIDAZOLAM HCL 2 MG/2ML IJ SOLN
1.0000 mg | Freq: Once | INTRAMUSCULAR | Status: AC
  Administered 2022-10-27: 2 mg via INTRAVENOUS

## 2022-10-27 MED ORDER — CHLORHEXIDINE GLUCONATE 0.12 % MT SOLN: 15.0000 mL | Freq: Once | OROMUCOSAL | Status: AC

## 2022-10-27 MED ORDER — FENTANYL CITRATE PF 50 MCG/ML IJ SOSY: 50.0000 ug | PREFILLED_SYRINGE | Freq: Once | INTRAMUSCULAR | Status: AC

## 2022-10-27 MED ORDER — ONDANSETRON HCL 4 MG/2ML IJ SOLN
4.0000 mg | Freq: Once | INTRAMUSCULAR | Status: AC | PRN
  Administered 2022-10-27: 4 mg via INTRAVENOUS

## 2022-10-27 MED ORDER — OXYCODONE HCL 5 MG/5ML PO SOLN: 5.0000 mg | Freq: Once | ORAL | Status: AC | PRN

## 2022-10-27 MED ORDER — DEXAMETHASONE SODIUM PHOSPHATE 10 MG/ML IJ SOLN
INTRAMUSCULAR | Status: DC | PRN
  Administered 2022-10-27: 10 mg via INTRAVENOUS

## 2022-10-27 MED ORDER — ORAL CARE MOUTH RINSE: 15.0000 mL | Freq: Once | OROMUCOSAL | Status: AC

## 2022-10-27 MED ORDER — ONDANSETRON 4 MG PO TBDP
4.0000 mg | ORAL_TABLET | Freq: Three times a day (TID) | ORAL | 0 refills | Status: DC | PRN
  Filled 2022-10-27: qty 20, 7d supply, fill #0

## 2022-10-27 MED ORDER — MIDAZOLAM HCL 2 MG/2ML IJ SOLN
INTRAMUSCULAR | Status: AC
  Filled 2022-10-27: qty 2

## 2022-10-27 MED ORDER — ASPIRIN 325 MG PO TBEC: 325.0000 mg | DELAYED_RELEASE_TABLET | Freq: Every day | ORAL | 0 refills | Status: AC

## 2022-10-27 SURGICAL SUPPLY — 77 items
ADAPTER IRRIG TUBE 2 SPIKE SOL (ADAPTER) ×2 IMPLANT
ADPR TBG 2 SPK PMP STRL ASCP (ADAPTER) ×2
ANCH SUT 2.9 PUSHLOCK ANCH (Orthopedic Implant) ×1 IMPLANT
APL PRP STRL LF DISP 70% ISPRP (MISCELLANEOUS) ×1
BLADE SHAVER 4.5X7 STR FR (MISCELLANEOUS) ×1 IMPLANT
BRUSH SCRUB EZ  4% CHG (MISCELLANEOUS) ×1
BRUSH SCRUB EZ 4% CHG (MISCELLANEOUS) ×1 IMPLANT
BUR BR 5.5 WIDE MOUTH (BURR) IMPLANT
CANNULA PART THRD DISP 5.75X7 (CANNULA) IMPLANT
CANNULA PARTIAL THREAD 2X7 (CANNULA) ×1 IMPLANT
CANNULA TWIST IN 8.25X9CM (CANNULA) IMPLANT
CHLORAPREP W/TINT 26 (MISCELLANEOUS) ×1 IMPLANT
COOLER POLAR GLACIER W/PUMP (MISCELLANEOUS) ×1 IMPLANT
DEVICE SUCT BLK HOLE OR FLOOR (MISCELLANEOUS) ×2 IMPLANT
DRAPE 3/4 80X56 (DRAPES) ×1 IMPLANT
DRAPE INCISE IOBAN 66X45 STRL (DRAPES) ×1 IMPLANT
DRAPE STERI 35X30 U-POUCH (DRAPES) ×1 IMPLANT
DRAPE U-SHAPE 47X51 STRL (DRAPES) ×2 IMPLANT
DRSG TEGADERM 4X4.75 (GAUZE/BANDAGES/DRESSINGS) ×2 IMPLANT
ELECT REM PT RETURN 9FT ADLT (ELECTROSURGICAL)
ELECTRODE REM PT RTRN 9FT ADLT (ELECTROSURGICAL) IMPLANT
GAUZE SPONGE 4X4 12PLY STRL (GAUZE/BANDAGES/DRESSINGS) ×1 IMPLANT
GAUZE XEROFORM 1X8 LF (GAUZE/BANDAGES/DRESSINGS) ×1 IMPLANT
GLOVE BIO SURGEON STRL SZ7.5 (GLOVE) ×1 IMPLANT
GLOVE BIOGEL PI IND STRL 8 (GLOVE) ×2 IMPLANT
GLOVE SURG ORTHO 8.0 STRL STRW (GLOVE) ×1 IMPLANT
GLOVE SURG SYN 7.5  E (GLOVE) ×1
GLOVE SURG SYN 7.5 E (GLOVE) ×1 IMPLANT
GLOVE SURG SYN 7.5 PF PI (GLOVE) ×1 IMPLANT
GOWN STRL REUS W/ TWL LRG LVL3 (GOWN DISPOSABLE) ×2 IMPLANT
GOWN STRL REUS W/TWL LRG LVL3 (GOWN DISPOSABLE) ×2
GOWN STRL REUS W/TWL LRG LVL4 (GOWN DISPOSABLE) ×1 IMPLANT
IV LACTATED RINGER IRRG 3000ML (IV SOLUTION) ×4
IV LR IRRIG 3000ML ARTHROMATIC (IV SOLUTION) ×4 IMPLANT
KIT STABILIZATION SHOULDER (MISCELLANEOUS) ×1 IMPLANT
KIT SUTURETAK 3.0 INSERT PERC (KITS) ×1 IMPLANT
KIT TURNOVER KIT A (KITS) ×1 IMPLANT
MANIFOLD NEPTUNE II (INSTRUMENTS) ×2 IMPLANT
MASK FACE SPIDER DISP (MASK) ×1 IMPLANT
MAT ABSORB  FLUID 56X50 GRAY (MISCELLANEOUS) ×2
MAT ABSORB FLUID 56X50 GRAY (MISCELLANEOUS) ×2 IMPLANT
NDL SAFETY ECLIP 18X1.5 (MISCELLANEOUS) ×1 IMPLANT
NEEDLE HYPO 22GX1.5 SAFETY (NEEDLE) ×1 IMPLANT
NS IRRIG 500ML POUR BTL (IV SOLUTION) ×1 IMPLANT
PACK ARTHROSCOPY SHOULDER (MISCELLANEOUS) ×1 IMPLANT
PAD ABD DERMACEA PRESS 5X9 (GAUZE/BANDAGES/DRESSINGS) ×1 IMPLANT
PAD ARMBOARD 7.5X6 YLW CONV (MISCELLANEOUS) ×1 IMPLANT
PAD WRAPON POLAR SHDR XLG (MISCELLANEOUS) ×1 IMPLANT
PASSER SUT FIRSTPASS SELF (INSTRUMENTS) ×1 IMPLANT
SHAVER BLADE BONE CUTTER  5.5 (BLADE)
SHAVER BLADE BONE CUTTER 5.5 (BLADE) IMPLANT
SLEEVE REMOTE CONTROL 5X12 (DRAPES) IMPLANT
SLING ULTRA II M (MISCELLANEOUS) ×1 IMPLANT
SPONGE T-LAP 18X18 ~~LOC~~+RFID (SPONGE) ×1 IMPLANT
STRAP SAFETY 5IN WIDE (MISCELLANEOUS) ×1 IMPLANT
SUT ETHILON 3-0 FS-10 30 BLK (SUTURE) ×1
SUT LASSO 90 DEG SD STR (SUTURE) ×1 IMPLANT
SUT MNCRL 4-0 (SUTURE) ×1
SUT MNCRL 4-0 27XMFL (SUTURE) ×1
SUT PDS AB 0 CT1 27 (SUTURE) IMPLANT
SUT VIC AB 0 CT1 36 (SUTURE) ×1 IMPLANT
SUT VIC AB 2-0 CT2 27 (SUTURE) ×1 IMPLANT
SUTURE EHLN 3-0 FS-10 30 BLK (SUTURE) ×1 IMPLANT
SUTURE MNCRL 4-0 27XMF (SUTURE) ×1 IMPLANT
SUTURE TAPE FIBERLINK 1.3 LOOP (SUTURE) IMPLANT
SUTURETAPE FIBERLINK 1.3 LOOP (SUTURE) ×1
SYR 10ML LL (SYRINGE) ×1 IMPLANT
SYSTEM IMPL TENODESIS LNT 2.9 (Orthopedic Implant) IMPLANT
TAPE CLOTH 3X10 WHT NS LF (GAUZE/BANDAGES/DRESSINGS) ×1 IMPLANT
TRAP FLUID SMOKE EVACUATOR (MISCELLANEOUS) ×1 IMPLANT
TUBE SET DOUBLEFLO INFLOW (TUBING) ×1 IMPLANT
TUBING CONNECTING 10 (TUBING) ×1 IMPLANT
TUBING OUTFLOW SET DBLFO PUMP (TUBING) ×1 IMPLANT
WAND WEREWOLF FLOW 90D (MISCELLANEOUS) ×1 IMPLANT
WATER STERILE IRR 500ML POUR (IV SOLUTION) ×1 IMPLANT
WRAP SHOULDER HOT/COLD PACK (SOFTGOODS) ×1 IMPLANT
WRAPON POLAR PAD SHDR XLG (MISCELLANEOUS) ×1

## 2022-10-27 NOTE — Transfer of Care (Signed)
Immediate Anesthesia Transfer of Care Note  Patient: Sharetha Santarelli [place]  Procedure(s) Performed: Left shoulder arthroscopic biceps tenodesis, distal clavicle excision (Left: Shoulder)  Patient Location: PACU  Anesthesia Type:General  Level of Consciousness: awake and oriented  Airway & Oxygen Therapy: Patient Spontanous Breathing  Post-op Assessment: Report given to RN and Post -op Vital signs reviewed and stable  Post vital signs: Reviewed and stable  Last Vitals:  Vitals Value Taken Time  BP 142/77 10/27/22 1458  Temp    Pulse 88 10/27/22 1500  Resp 15 10/27/22 1500  SpO2 91 % 10/27/22 1500  Vitals shown include unvalidated device data.  Last Pain:  Vitals:   10/27/22 0823  TempSrc: Temporal  PainSc: 0-No pain      Patients Stated Pain Goal: 0 ([id number])  Complications: There were no known notable events for this encounter.

## 2022-10-27 NOTE — Anesthesia Procedure Notes (Signed)
Anesthesia Regional Block: Interscalene brachial plexus block   Pre-Anesthetic Checklist: , timeout performed,  Correct Patient, Correct Site, Correct Laterality,  Correct Procedure, Correct Position, site marked,  Risks and benefits discussed,  Surgical consent,  Pre-op evaluation,  At surgeon's request and post-op pain management  Laterality: Left  Prep: chloraprep       Needles:  Injection technique: Single-shot  Needle Type: Echogenic Needle     Needle Length: 4cm  Needle Gauge: 25     Additional Needles:   Procedures:,,,, ultrasound used (permanent image in chart),,    Narrative:  Injection made incrementally with aspirations every 5 mL.  Performed by: Personally  Anesthesiologist: Arita Miss, MD  Additional Notes: Patient's chart reviewed and they were deemed appropriate candidate for procedure, at surgeon's request. Patient educated about risks, benefits, and alternatives of the block including but not limited to: temporary or permanent nerve damage, bleeding, infection, damage to surround tissues, pneumothorax, hemidiaphragmatic paralysis, unilateral Horner's syndrome, block failure, local anesthetic toxicity. Patient expressed understanding. A formal time-out was conducted consistent with institution rules.  Monitors were applied, and minimal sedation used (see nursing record). The site was prepped with skin prep and allowed to dry, and sterile gloves were used. A high frequency linear ultrasound probe with probe cover was utilized throughout. C5-7 nerve roots located and appeared anatomically normal, local anesthetic injected around them, and echogenic block needle trajectory was monitored throughout. Aspiration performed every 81ml. Lung and blood vessels were avoided. All injections were performed without resistance and free of blood. There was a transient paresthesia initially, which quickly resolved with prompt needle redirection. The patient tolerated the procedure  well.  Injectate: 59ml exparel + 91ml 0.5% bupivacaine

## 2022-10-27 NOTE — Discharge Instructions (Addendum)
Post-Op Instructions - Arthroscopic Biceps Tenodesis and/or Distal Clavicle Excision  1. Bracing: You will wear a shoulder immobilizer or sling for no longer than 2 weeks. You can self wean from sling when it is comfortable to do so.   2. Driving: No driving for at least 2 weeks post-op.   3. Activity: Progress to motion as tolerated, moving from passive to active-assisted to active motion. Goal for motion by 4 weeks is 140 degrees forward flexion, 40 ER, IR behind back. Avoid abduction and ER/IR until after 4 weeks.  Return to daily activities normally takes 2-3 months on average.  4. Physical Therapy: Begins 3-4 days after surgery  5. Medications:  - You will be provided a prescription for narcotic pain medicine. After surgery, take 1-2 narcotic tablets every 4 hours if needed for severe pain.  - A prescription for anti-nausea medication will be provided in case the narcotic medicine causes nausea - take 1 tablet every 6 hours only if nauseated.   - Take ASA 325mg /day x 2 weeks to help prevent DVTs/PEs (blood clots).  - Take ibuprofen 800mg  three times/day with food. Stop if there is any GI irritation. - Take 1000mg  Tylenol (2 extra strength) 3x/day until you have no significant pain.   If you are taking prescription medication for anxiety, depression, insomnia, muscle spasm, chronic pain, or for attention deficit disorder, you are advised that you are at a higher risk of adverse effects with use of narcotics post-op, including narcotic addiction/dependence, depressed breathing, death. If you use non-prescribed substances: alcohol, marijuana, cocaine, heroin, methamphetamines, etc., you are at a higher risk of adverse effects with use of narcotics post-op, including narcotic addiction/dependence, depressed breathing, death. You are advised that taking > 50 morphine milligram equivalents (MME) of narcotic pain medication per day results in twice the risk of overdose or death. For your  prescription provided: hydrocodone 5 mg - taking more than 8 tablets per day would result in > 50 morphine milligram equivalents (MME) of narcotic pain medication. Be advised that we will prescribe narcotics short-term, for acute post-operative pain only - 3 weeks for major operations such as shoulder repair/reconstruction surgeries.    6. Post-Op Appointment:  Your first post-op appointment will be 10-14 days post-op.  7. Work or School: For most, but not all procedures, we advise staying out of work or school for at least 1 to 2 weeks in order to recover from the stress of surgery and to allow time for healing.   If you need a work or school note this can be provided.   8. Smoking: If you are a smoker, you need to refrain from smoking in the postoperative period. The nicotine in cigarettes will inhibit healing of your shoulder repair and decrease the chance of successful repair. Similarly, nicotine containing products (gum, patches) should be avoided.   Post-operative Brace: Apply and remove the brace you received as you were instructed to at the time of fitting and as described in detail as the brace's instructions for use indicate.  Wear the brace for the period of time prescribed by your physician.  The brace can be cleaned with soap and water and allowed to air dry only.  Should the brace result in increased pain, decreased feeling (numbness/tingling), increased swelling or an overall worsening of your medical condition, please contact your doctor immediately.  If an emergency situation occurs as a result of wearing the brace after normal business hours, please dial 911 and seek immediate medical attention.  Let your doctor know if you have any further questions about the brace issued to you. Refer to the shoulder sling instructions for use if you have any questions regarding the correct fit of your shoulder sling.  [place] for Troubleshooting: [phone]  Video that illustrates  how to properly use a shoulder sling: "Instructions for Proper Use of an Orthopaedic Sling" [email or web address].hu  AMBULATORY SURGERY  DISCHARGE INSTRUCTIONS   The drugs that you were given will stay in your system until tomorrow so for the next 24 hours you should not:  Drive an automobile Make any legal decisions Drink any alcoholic beverage   You may resume regular meals tomorrow.  Today it is better to start with liquids and gradually work up to solid foods.  You may eat anything you prefer, but it is better to start with liquids, then soup and crackers, and gradually work up to solid foods.   Please notify your doctor immediately if you have any unusual bleeding, trouble breathing, redness and pain at the surgery site, drainage, fever, or pain not relieved by medication.    Additional Instructions: PLEASE LEAVE YOUR TEAL ARMBAND ON FOR 4 DAYS    Please contact your physician with any problems or Same Day Surgery at [phone], Monday through Friday 6 am to 4 pm, or [place] at [facility] number at [phone].  POLAR CARE INFORMATION  [email or web address]  How to use [facility] Cold Therapy System?  YouTube   [email or web address]  OPERATING INSTRUCTIONS  Start the product With dry hands, connect the transformer to the electrical connection located on the top of the cooler. Next, plug the transformer into an appropriate electrical outlet. The unit will automatically start running at this point.  To stop the pump, disconnect electrical power.  Unplug to stop the product when not in use. Unplugging the Polar Care unit turns it off. Always unplug immediately after use. Never leave it plugged in while unattended. Remove pad.    FIRST ADD WATER TO FILL LINE, THEN ICE---Replace ice when existing ice is almost melted  1 Discuss Treatment with your [facility] Practitioner and Use Only as Prescribed 2  Apply Insulation Barrier & Cold Therapy Pad 3 Check for Moisture 4 Inspect Skin Regularly  Tips and Trouble Shooting Usage Tips 1. Use cubed or chunked ice for optimal performance. 2. It is recommended to drain the Pad between uses. To drain the pad, hold the Pad upright with the hose pointed toward the ground. Depress the black plunger and allow water to drain out. 3. You may disconnect the Pad from the unit without removing the pad from the affected area by depressing the silver tabs on the hose coupling and gently pulling the hoses apart. The Pad and unit will seal itself and will not leak. Note: Some dripping during release is normal. 4. DO NOT RUN PUMP WITHOUT WATER! The pump in this unit is designed to run with water. Running the unit without water will cause permanent damage to the pump. 5. Unplug unit before removing lid.  TROUBLESHOOTING GUIDE Pump not running, Water not flowing to the pad, Pad is not getting cold 1. Make sure the transformer is plugged into the wall outlet. 2. Confirm that the ice and water are filled to the indicated levels. 3. Make sure there are no kinks in the pad. 4. Gently pull on the blue tube to make sure the tube/pad junction is straight. 5. Remove the pad from the treatment site and  ll it while the pad is lying at; then reapply. 6. Confirm that the pad couplings are securely attached to the unit. Listen for the double clicks (Figure 1) to confirm the pad couplings are securely attached.  Leaks    Note: Some condensation on the lines, controller, and pads is unavoidable, especially in warmer climates. 1. If using a [facility] Polar Care Cold Therapy unit with a detachable Cold Therapy Pad, and a leak exists (other than condensation on the lines) disconnect the pad couplings. Make sure the silver tabs on the couplings are depressed before reconnecting the pad to the pump hose; then confirm both sides of the coupling are properly clicked in. 2. If the coupling  continues to leak or a leak is detected in the pad itself, stop using it and call [facility] at [phone].  Cleaning After use, empty and dry the unit with a soft cloth. Warm water and mild detergent may be used occasionally to clean the pump and tubes.  WARNING: The [place] can be cold enough to cause serious injury, including full skin necrosis. Follow these Operating Instructions, and carefully read the Product Insert (see pouch on side of unit) and the Cold Therapy Pad Fitting Instructions (provided with each Cold Therapy Pad) prior to use.  SHOULDER SLING IMMOBILIZER   VIDEO Slingshot 2 Shoulder Brace Application - YouTube ---[email or web address]-[email or web address]  INSTRUCTIONS While supporting the injured arm, slide the forearm into the sling. Wrap the adjustable shoulder strap around the neck and shoulders and attach the strap end to the sling using  the "alligator strap tab."  Adjust the shoulder strap to the required length. Position the shoulder pad behind the neck. To secure the shoulder pad location (optional), pull the shoulder strap away from the shoulder pad, unfold the hook material on the top of the pad, then press the shoulder strap back onto the hook material to secure the pad in place. Attach the closure strap across the open top of the sling. Position the strap so that it holds the arm securely in the sling. Next, attach the thumb strap to the open end of the sling between the thumb and fingers. After sling has been fit, it may be easily removed and reapplied using the quick release buckle on shoulder strap. If a neutral pillow or 15 abduction pillow is included, place the pillow at the waistline. Attach the sling to the pillow, lining up hook material on the pillow with the loop on sling. Adjust the waist strap to fit.  If waist strap is too long, cut it to fit. Use the small piece of double sided hook material (located on top of the pillow) to  secure the strap end. Place the double sided hook material on the inside of the cut strap end and secure it to the waist strap.     If no pillow is included, attach the waist strap to the sling and adjust to fit.    Washing Instructions: Straps and sling must be removed and cleaned regularly depending on your activity level and perspiration. Hand wash straps and sling in cold water with mild detergent, rinse, air dry        Interscalene Nerve Block with Exparel   For your surgery you have received an Interscalene Nerve Block with Exparel. Nerve Blocks affect many types of nerves, including nerves that control movement, pain and normal sensation.  You may experience feelings such as numbness, tingling, heaviness, weakness or the inability to move your  arm or the feeling or sensation that your arm has "fallen asleep". A nerve block with Exparel can last up to 5 days.  Usually the weakness wears off first.  The tingling and heaviness usually wear off next.  Finally you may start to notice pain.  Keep in mind that this may occur in any order.  Once a nerve block starts to wear off it is usually completely gone within 60 minutes. ISNB may cause mild shortness of breath, a hoarse voice, blurry vision, unequal pupils, or drooping of the face on the same side as the nerve block.  These symptoms will usually resolve with the numbness.  Very rarely the procedure itself can cause mild seizures. If needed, your surgeon will give you a prescription for pain medication.  It will take about 60 minutes for the oral pain medication to become fully effective.  So, it is recommended that you start taking this medication before the nerve block first begins to wear off, or when you first begin to feel discomfort. Take your pain medication only as prescribed.  Pain medication can cause sedation and decrease your breathing if you take more than you need for the level of pain that you have. Nausea is a common side effect of  many pain medications.  You may want to eat something before taking your pain medicine to prevent nausea. After an Interscalene nerve block, you cannot feel pain, pressure or extremes in temperature in the effected arm.  Because your arm is numb it is at an increased risk for injury.  To decrease the possibility of injury, please practice the following:  While you are awake change the position of your arm frequently to prevent too much pressure on any one area for prolonged periods of time.  If you have a cast or tight dressing, check the color or your fingers every couple of hours.  Call your surgeon with the appearance of any discoloration (white or blue). If you are given a sling to wear before you go home, please wear it  at all times until the block has completely worn off.  Do not get up at night without your sling. Please contact [facility] or your surgeon if you do not begin to regain sensation after 7 days from the surgery.  Anesthesia may be contacted by calling the Same Day Surgery Department, Mon. through Fri., 6 am to 4 pm at [phone].   If you experience any other problems or concerns, please contact your surgeon's office. If you experience severe or prolonged shortness of breath go to the nearest emergency department.

## 2022-10-27 NOTE — Anesthesia Procedure Notes (Signed)
Procedure Name: Intubation Date/Time: 10/27/2022 1:22 PM  Performed by: Patience Musca., CRNAPre-anesthesia Checklist: Patient identified, Patient being monitored, Timeout performed, Emergency Drugs available and Suction available Patient Re-evaluated:Patient Re-evaluated prior to induction Oxygen Delivery Method: Circle system utilized Preoxygenation: Pre-oxygenation with 100% oxygen Induction Type: IV induction Ventilation: Mask ventilation without difficulty Laryngoscope Size: 3 and McGraph Grade View: Grade I Tube type: Oral Tube size: 6.5 mm Number of attempts: 1 Airway Equipment and Method: Stylet Placement Confirmation: ETT inserted through vocal cords under direct vision, positive ETCO2 and breath sounds checked- equal and bilateral Secured at: 20 cm Tube secured with: Tape Dental Injury: Teeth and Oropharynx as per pre-operative assessment

## 2022-10-27 NOTE — Anesthesia Preprocedure Evaluation (Signed)
Anesthesia Evaluation  Patient identified by MRN, date of birth, ID band Patient awake    Reviewed: Allergy & Precautions, NPO status , Patient's Chart, lab work & pertinent test results  History of Anesthesia Complications Negative for: history of anesthetic complications  Airway Mallampati: II  TM Distance: >3 FB Neck ROM: Full    Dental no notable dental hx. (+) Teeth Intact   Pulmonary neg pulmonary ROS, neg sleep apnea, neg COPD, Patient abstained from smoking.Not current smoker, former smoker   Pulmonary exam normal breath sounds clear to auscultation       Cardiovascular Exercise Tolerance: Good METS(-) hypertension(-) CAD and (-) Past MI negative cardio ROS (-) dysrhythmias  Rhythm:Regular Rate:Normal - Systolic murmurs    Neuro/Psych  PSYCHIATRIC DISORDERS Anxiety Depression    negative neurological ROS     GI/Hepatic ,GERD  Controlled,,(+)     (-) substance abuse    Endo/Other  neg diabetesHypothyroidism  Morbid obesityPre-diabetes. Has not taken GLP1 medication for months.  Renal/GU negative Renal ROS     Musculoskeletal   Abdominal  (+) + obese  Peds  Hematology   Anesthesia Other Findings Past Medical History: No date: Allergy No date: Anxiety No date: Arthritis     Comment:  PSORIATIC No date: Borderline diabetes No date: Complication of anesthesia No date: Depression 07/27/2019: Dysplastic nevus     Comment:  Right lateral bicep. Severe atypia, close to margin.               Excised 11/14/2019, margins free. 07/30/2021: Dysplastic nevus     Comment:  right mid back paraspinal, mod atypia 07/30/2022: Dysplastic nevus     Comment:  Right Mid Buttocks, Severe atypia, Excised 09/08/22 No date: GERD (gastroesophageal reflux disease)     Comment:  RARE-NO MEDS 2004: History of kidney stones No date: History of tobacco use 07/16/2016: Hx of dysplastic nevus     Comment:  Right superior medial  buttocks just lateral to superior               crease. Mild atypia clost to deep margin 07/12/2017: Hx of dysplastic nevus     Comment:  Left upper back sup. scapula. Severe atypia with focal               fibrosis, close to lateral margin. Excised: 09/28/2017.               Margins free 07/12/2017: Hx of dysplastic nevus     Comment:  Left LQA periumbilical. Moderate atypia, irritated,               close to lateral margin 07/27/2019, exc 11/14/19: Hx of dysplastic nevus     Comment:  Right lateral bicep. Severe atypia, close to margin No date: Hypothyroidism No date: Morbidly obese (HCC) No date: PONV (postoperative nausea and vomiting) No date: Psoriasis     Comment:  Cosentyx No date: Tachycardia  Reproductive/Obstetrics                              Anesthesia Physical Anesthesia Plan  ASA: 3  Anesthesia Plan: General   Post-op Pain Management: Regional block* and Ofirmev IV (intra-op)*   Induction: Intravenous  PONV Risk Score and Plan: 3 and Ondansetron, Dexamethasone and Midazolam  Airway Management Planned: Oral ETT and Video Laryngoscope Planned  Additional Equipment: None  Intra-op Plan:   Post-operative Plan: Extubation in OR  Informed Consent: I have reviewed the  patients History and Physical, chart, labs and discussed the procedure including the risks, benefits and alternatives for the proposed anesthesia with the patient or authorized representative who has indicated his/her understanding and acceptance.     Dental advisory given  Plan Discussed with: CRNA and Surgeon  Anesthesia Plan Comments: (Discussed risks of anesthesia with patient, including PONV, sore throat, lip/dental/eye damage. Rare risks discussed as well, such as cardiorespiratory and neurological sequelae, and allergic reactions. Discussed the role of CRNA in patient's perioperative care. Patient understands. Patient informed about increased incidence of above  perioperative risk due to high BMI. Patient understands.  Discussed r/b/a of interscalene block, including elective nature. Risks discussed: - Rare: bleeding, infection, nerve damage - shortness of breath from hemidiaphragmatic paralysis - unilateral horner's syndrome - poor/non-working blocks - reactions and toxicity to local anesthetic Patient understands and agrees. )         Anesthesia Quick Evaluation

## 2022-10-27 NOTE — H&P (Signed)
Paper H&P to be scanned into permanent record. H&P reviewed. No significant changes noted.  

## 2022-10-27 NOTE — Anesthesia Postprocedure Evaluation (Signed)
Anesthesia Post Note  Patient: Kelsey Randolph  Procedure(s) Performed: Left shoulder arthroscopic biceps tenodesis, distal clavicle excision (Left: Shoulder)  Patient location during evaluation: PACU Anesthesia Type: General Level of consciousness: awake and alert Pain management: pain level controlled Vital Signs Assessment: post-procedure vital signs reviewed and stable Respiratory status: spontaneous breathing, nonlabored ventilation, respiratory function stable and patient connected to nasal cannula oxygen Cardiovascular status: blood pressure returned to baseline and stable Postop Assessment: no apparent nausea or vomiting Anesthetic complications: no   There were no known notable events for this encounter.   Last Vitals:  Vitals:   10/27/22 1500 10/27/22 1515  BP: 137/75 (!) 155/93  Pulse: 90 84  Resp: 15 15  Temp: (!) 36.2 C   SpO2: 96% 98%    Last Pain:  Vitals:   10/27/22 1500  TempSrc:   PainSc: 0-No pain                 Arita Miss

## 2022-10-27 NOTE — Op Note (Signed)
SURGERY DATE: 10/27/2022   PRE-OP DIAGNOSIS:  1.  Left shoulder AC joint arthritis 2.  Left shoulder biceps tendinopathy 3.  Left shoulder subacromial bursitis  POST-OP DIAGNOSIS: 1.  Left shoulder AC joint arthritis 2.  Left shoulder biceps tendinopathy 3.  Left shoulder subacromial bursitis   PROCEDURES:  1.  Left shoulder arthroscopic biceps tenodesis 2.  Left shoulder arthroscopic distal clavicle excision 3.  Left shoulder arthroscopic extensive debridement of shoulder (glenohumeral and subacromial spaces)  SURGEON: Cato Mulligan, MD  ASSISTANT:  none  ANESTHESIA: Gen with Exparel interscalene block  ESTIMATED BLOOD LOSS: minimal  TOTAL IV FLUIDS: per anesthesia   IMPLANTS:  Arthrex 2.54mm PushLock x 1  OPERATIVE FINDINGS:  Examination under anesthesia: A careful examination under anesthesia was performed.  Passive range of motion was: FF: 160; ER at side: 80; ER in abduction: 110; IR in abduction: 50.    Intra-operative findings: A thorough arthroscopic examination of the shoulder was performed.  The findings are: 1. Biceps tendon: Tendinopathy with erythema along the intra-articular portion  2. Superior labrum: Type I SLAP tear 3. Posterior labrum and capsule: normal 4. Inferior capsule and inferior recess: normal 5. Glenoid cartilage surface: Normal 6. Supraspinatus attachment: Mild undersurface fraying involving less than 10% of the articular footprint 7. Posterior rotator cuff attachment: normal 8. Humeral head articular cartilage: Normal 9. Rotator interval: significant synovitis 10: Subscapularis tendon: attachment intact. 11. Anterior labrum: Mildly degenerative. 12. IGHL: normal  OPERATIVE REPORT:   Indications for procedure: Kelsey Randolph is a 47 y.o. female with almost 1 year of left shoulder pain.  Pain is located primarily about the [facility] joint.  Imaging showed significant AC joint arthritis and significant bone marrow edema about the [facility] joint.   Patient had ultrasound-guided [facility] joint corticosteroid injection that provided excellent relief of her symptoms, but relief is only temporary.  She also had some mild pain over her biceps tendon.  Given this, after discussion of risks, benefits, and alternatives to surgery, the patient elected to proceed.     Procedure in detail:  I identified the patient in the pre-operative holding area.  I marked the operative shoulder with my initials. I reviewed the risks and benefits of the proposed surgical intervention, and the patient (and/or patient's guardian) wished to proceed.  Anesthesia was then performed with an interscalene block with Exparel.  The patient was transferred to the operative suite and placed in the beach chair position.    SCDs were placed on the lower extremities. Appropriate IV antibiotics were administered prior to incision. The operative upper extremity was then prepped and draped in standard fashion. A time out was performed confirming the correct extremity, correct patient, and correct procedure.   I then created a standard posterior portal with an 11 blade. The glenohumeral joint was easily entered with a blunt trochar and the arthroscope introduced. The findings of diagnostic arthroscopy are described above. I debrided degenerative tissue including the synovitic/degenerative tissue about the rotator interval, the superior labrum,, anterior labrum, and articular side of the supraspinatus. I then coagulated the inflamed synovium to obtain hemostasis and reduce the risk of post-operative swelling using an Arthrocare radiofrequency device.  I then turned my attention to the arthroscopic biceps tenodesis. The Loop n Tack technique was used to pass a FiberTape through the biceps in a locked fashion adjacent to the biceps anchor.  A hole for a 2.9 mm Arthrex PushLock was drilled in the bicipital groove just superior to the subscapularis tendon insertion.  The biceps tendon was then cut and  the biceps anchor complex was debrided down to a stable base on the superior labrum with an oscillating shaver.  The FiberTape was loaded onto the PushLock anchor and impacted into place into the previously drilled hole in the bicipital groove.  This appropriately secured the biceps into the bicipital groove and took it off of tension.  Next, the arthroscope was then introduced into the subacromial space. A direct lateral portal was created with an 11-blade after spinal needle localization. An extensive subacromial bursectomy and debridement was performed using a combination of the shaver and Arthrocare wand.  The bursal side of the rotator cuff was noted to be intact.  I then turned my attention to the arthroscopic distal clavicle excision. I identified the acromioclavicular joint. Surrounding bursal tissue was debrided and the edges of the joint were identified. I used the 5.49mm barrel burr to remove the distal clavicle parallel to the edge of the acromion. I was able to fit two widths of the burr into the space between the distal clavicle and acromion, signifying that I had removed ~37mm of distal clavicle. This was confirmed by viewing anteriorly and introducing a probe with measuring marks from the lateral portal. Hemostasis was achieved with an Arthrocare wand. Fluid was evacuated from the shoulder.   Fluid was evacuated from the shoulder, and the portals were closed with 3-0 Nylon. Xeroform was applied to the portals. A sterile dressing was applied, followed by a Polar Care sleeve and a SlingShot shoulder immobilizer/sling. The patient was awakened from anesthesia without difficulty and was transferred to the PACU in stable condition.     COMPLICATIONS: none  DISPOSITION: plan for discharge home after recovery in PACU   POSTOPERATIVE PLAN: Remain in sling (except hygiene and elbow/wrist/hand RoM exercises as instructed by PT) x 1-2 weeks and NWB for this time. PT to begin 3-4 days after surgery.   Use shoulder arthroscopy/debridement with distal clavicle excision rehab protocol.  Follow-up in approximately 2 weeks.

## 2022-11-12 ENCOUNTER — Other Ambulatory Visit: Payer: Self-pay

## 2022-11-12 MED ORDER — WEGOVY 1 MG/0.5ML ~~LOC~~ SOAJ
1.0000 mg | SUBCUTANEOUS | 0 refills | Status: DC
  Filled 2022-11-12: qty 2, 28d supply, fill #0

## 2022-11-20 ENCOUNTER — Other Ambulatory Visit: Payer: Self-pay

## 2022-11-20 MED ORDER — POTASSIUM CHLORIDE ER 10 MEQ PO TBCR
EXTENDED_RELEASE_TABLET | ORAL | 11 refills | Status: DC
  Filled 2022-11-20: qty 30, 30d supply, fill #0
  Filled 2022-12-21: qty 30, 30d supply, fill #1
  Filled 2023-01-19: qty 30, 30d supply, fill #2
  Filled 2023-02-18: qty 30, 30d supply, fill #3
  Filled 2023-03-17: qty 30, 30d supply, fill #4
  Filled 2023-04-23: qty 30, 30d supply, fill #5
  Filled 2023-05-28: qty 30, 30d supply, fill #6
  Filled 2023-07-26: qty 30, 30d supply, fill #7
  Filled 2023-09-23: qty 30, 30d supply, fill #8

## 2022-11-27 ENCOUNTER — Other Ambulatory Visit: Payer: Self-pay

## 2022-11-27 MED ORDER — METHYLPREDNISOLONE 4 MG PO TBPK
ORAL_TABLET | ORAL | 0 refills | Status: DC
  Filled 2022-11-27: qty 21, 6d supply, fill #0

## 2022-11-27 MED ORDER — TERBINAFINE HCL 250 MG PO TABS
250.0000 mg | ORAL_TABLET | Freq: Every day | ORAL | 2 refills | Status: DC
  Filled 2022-11-27: qty 30, 30d supply, fill #0
  Filled 2022-12-28: qty 30, 30d supply, fill #1
  Filled 2023-02-12: qty 30, 30d supply, fill #2

## 2022-12-08 ENCOUNTER — Other Ambulatory Visit: Payer: Self-pay

## 2022-12-09 ENCOUNTER — Other Ambulatory Visit: Payer: Self-pay

## 2022-12-09 MED ORDER — CLOTRIMAZOLE 1 % EX CREA
TOPICAL_CREAM | Freq: Two times a day (BID) | CUTANEOUS | 2 refills | Status: DC
  Filled 2022-12-09: qty 60, 30d supply, fill #0

## 2022-12-09 MED ORDER — FLUCONAZOLE 100 MG PO TABS
100.0000 mg | ORAL_TABLET | Freq: Every day | ORAL | 0 refills | Status: DC
  Filled 2022-12-09: qty 7, 7d supply, fill #0

## 2022-12-09 MED ORDER — NORGESTIM-ETH ESTRAD TRIPHASIC 0.18/0.215/0.25 MG-25 MCG PO TABS
1.0000 | ORAL_TABLET | Freq: Every day | ORAL | 11 refills | Status: AC
  Filled 2022-12-09: qty 28, 28d supply, fill #0
  Filled 2023-01-05: qty 28, 28d supply, fill #1
  Filled 2023-02-12: qty 28, 28d supply, fill #2
  Filled 2023-03-17: qty 28, 28d supply, fill #3
  Filled 2023-04-14: qty 28, 28d supply, fill #4
  Filled 2023-05-12: qty 28, 28d supply, fill #5
  Filled 2023-07-05: qty 28, 28d supply, fill #6
  Filled 2023-09-23: qty 28, 28d supply, fill #7
  Filled 2023-11-24: qty 28, 28d supply, fill #8

## 2022-12-11 ENCOUNTER — Other Ambulatory Visit: Payer: Self-pay

## 2022-12-11 ENCOUNTER — Other Ambulatory Visit: Payer: Self-pay | Admitting: Family Medicine

## 2022-12-11 DIAGNOSIS — L304 Erythema intertrigo: Secondary | ICD-10-CM

## 2022-12-11 DIAGNOSIS — R079 Chest pain, unspecified: Secondary | ICD-10-CM

## 2022-12-11 DIAGNOSIS — Z6841 Body Mass Index (BMI) 40.0 and over, adult: Secondary | ICD-10-CM

## 2022-12-11 DIAGNOSIS — Z8249 Family history of ischemic heart disease and other diseases of the circulatory system: Secondary | ICD-10-CM

## 2022-12-11 DIAGNOSIS — Z87891 Personal history of nicotine dependence: Secondary | ICD-10-CM

## 2022-12-14 ENCOUNTER — Other Ambulatory Visit: Payer: Self-pay

## 2022-12-15 ENCOUNTER — Other Ambulatory Visit: Payer: Self-pay

## 2022-12-17 ENCOUNTER — Other Ambulatory Visit: Payer: Self-pay

## 2022-12-21 ENCOUNTER — Other Ambulatory Visit: Payer: Self-pay

## 2022-12-28 ENCOUNTER — Other Ambulatory Visit: Payer: Self-pay

## 2022-12-29 ENCOUNTER — Inpatient Hospital Stay: Admission: RE | Admit: 2022-12-29 | Payer: BC Managed Care – PPO | Source: Ambulatory Visit

## 2022-12-31 ENCOUNTER — Other Ambulatory Visit: Payer: Self-pay

## 2022-12-31 MED ORDER — SULFASALAZINE 500 MG PO TABS
500.0000 mg | ORAL_TABLET | Freq: Two times a day (BID) | ORAL | 0 refills | Status: DC
  Filled 2022-12-31: qty 180, 90d supply, fill #0

## 2022-12-31 MED ORDER — KETOCONAZOLE 200 MG PO TABS
200.0000 mg | ORAL_TABLET | Freq: Every day | ORAL | 1 refills | Status: DC
  Filled 2022-12-31: qty 30, 30d supply, fill #0

## 2023-01-01 ENCOUNTER — Other Ambulatory Visit: Payer: Self-pay

## 2023-01-05 ENCOUNTER — Other Ambulatory Visit: Payer: Self-pay

## 2023-01-06 ENCOUNTER — Other Ambulatory Visit: Payer: Self-pay

## 2023-01-06 MED ORDER — BUPROPION HCL ER (XL) 300 MG PO TB24
300.0000 mg | ORAL_TABLET | Freq: Every day | ORAL | 1 refills | Status: DC
  Filled 2023-01-06: qty 90, 90d supply, fill #0
  Filled 2023-04-14: qty 90, 90d supply, fill #1

## 2023-01-06 MED ORDER — CELECOXIB 100 MG PO CAPS
100.0000 mg | ORAL_CAPSULE | Freq: Every day | ORAL | 0 refills | Status: DC
  Filled 2023-01-06: qty 90, 90d supply, fill #0

## 2023-01-11 ENCOUNTER — Ambulatory Visit
Admission: RE | Admit: 2023-01-11 | Discharge: 2023-01-11 | Disposition: A | Discharge status: Home / Self Care | Payer: Self-pay | Source: Ambulatory Visit | Attending: Family Medicine | Admitting: Family Medicine

## 2023-01-11 DIAGNOSIS — R079 Chest pain, unspecified: Secondary | ICD-10-CM

## 2023-01-11 DIAGNOSIS — Z8249 Family history of ischemic heart disease and other diseases of the circulatory system: Secondary | ICD-10-CM

## 2023-01-11 DIAGNOSIS — Z87891 Personal history of nicotine dependence: Secondary | ICD-10-CM

## 2023-01-11 DIAGNOSIS — L304 Erythema intertrigo: Secondary | ICD-10-CM

## 2023-01-11 DIAGNOSIS — Z6841 Body Mass Index (BMI) 40.0 and over, adult: Secondary | ICD-10-CM

## 2023-01-19 ENCOUNTER — Other Ambulatory Visit: Payer: Self-pay

## 2023-01-19 MED ORDER — NEOMYCIN-POLYMYXIN-DEXAMETH 0.1 % OP SUSP
1.0000 [drp] | Freq: Three times a day (TID) | OPHTHALMIC | 0 refills | Status: DC
  Filled 2023-01-19: qty 5, 16d supply, fill #0

## 2023-02-02 ENCOUNTER — Other Ambulatory Visit: Payer: Self-pay

## 2023-02-04 ENCOUNTER — Ambulatory Visit: Payer: BC Managed Care – PPO | Admitting: Dermatology

## 2023-02-12 ENCOUNTER — Other Ambulatory Visit: Payer: Self-pay

## 2023-02-18 ENCOUNTER — Other Ambulatory Visit: Payer: Self-pay

## 2023-02-18 MED ORDER — CYCLOBENZAPRINE HCL 10 MG PO TABS
10.0000 mg | ORAL_TABLET | Freq: Two times a day (BID) | ORAL | 0 refills | Status: DC | PRN
  Filled 2023-02-18: qty 45, 23d supply, fill #0

## 2023-03-17 ENCOUNTER — Other Ambulatory Visit: Payer: Self-pay

## 2023-03-17 ENCOUNTER — Encounter: Payer: Self-pay | Admitting: Dermatology

## 2023-03-17 ENCOUNTER — Ambulatory Visit: Payer: BC Managed Care – PPO | Admitting: Dermatology

## 2023-03-17 DIAGNOSIS — L814 Other melanin hyperpigmentation: Secondary | ICD-10-CM | POA: Diagnosis not present

## 2023-03-17 DIAGNOSIS — D1801 Hemangioma of skin and subcutaneous tissue: Secondary | ICD-10-CM | POA: Diagnosis not present

## 2023-03-17 DIAGNOSIS — Z86018 Personal history of other benign neoplasm: Secondary | ICD-10-CM

## 2023-03-17 DIAGNOSIS — W908XXA Exposure to other nonionizing radiation, initial encounter: Secondary | ICD-10-CM

## 2023-03-17 DIAGNOSIS — Z7189 Other specified counseling: Secondary | ICD-10-CM

## 2023-03-17 DIAGNOSIS — L304 Erythema intertrigo: Secondary | ICD-10-CM | POA: Diagnosis not present

## 2023-03-17 DIAGNOSIS — Z1283 Encounter for screening for malignant neoplasm of skin: Secondary | ICD-10-CM | POA: Diagnosis not present

## 2023-03-17 DIAGNOSIS — L578 Other skin changes due to chronic exposure to nonionizing radiation: Secondary | ICD-10-CM

## 2023-03-17 DIAGNOSIS — D229 Melanocytic nevi, unspecified: Secondary | ICD-10-CM

## 2023-03-17 DIAGNOSIS — Z79899 Other long term (current) drug therapy: Secondary | ICD-10-CM

## 2023-03-17 DIAGNOSIS — L821 Other seborrheic keratosis: Secondary | ICD-10-CM

## 2023-03-17 NOTE — Progress Notes (Signed)
   Follow-Up Visit   Subjective  Kelsey Randolph is a 47 y.o. female who presents for the following: Skin Cancer Screening and Full Body Skin Exam  The patient presents for Total-Body Skin Exam (TBSE) for skin cancer screening and mole check. The patient has spots, moles and lesions to be evaluated, some may be new or changing and the patient may have concern these could be cancer.    The following portions of the chart were reviewed this encounter and updated as appropriate: medications, allergies, medical history  Review of Systems:  No other skin or systemic complaints except as noted in HPI or Assessment and Plan.  Objective  Well appearing patient in no apparent distress; mood and affect are within normal limits.  A full examination was performed including scalp, head, eyes, ears, nose, lips, neck, chest, axillae, abdomen, back, buttocks, bilateral upper extremities, bilateral lower extremities, hands, feet, fingers, toes, fingernails, and toenails. All findings within normal limits unless otherwise noted below.   Relevant physical exam findings are noted in the Assessment and Plan.    Assessment & Plan   SKIN CANCER SCREENING PERFORMED TODAY.  ACTINIC DAMAGE - Chronic condition, secondary to cumulative UV/sun exposure - diffuse scaly erythematous macules with underlying dyspigmentation - Recommend daily broad spectrum sunscreen SPF 30+ to sun-exposed areas, reapply every 2 hours as needed.  - Staying in the shade or wearing long sleeves, sun glasses (UVA+UVB protection) and wide brim hats (4-inch brim around the entire circumference of the hat) are also recommended for sun protection.  - Call for new or changing lesions.  LENTIGINES, SEBORRHEIC KERATOSES, HEMANGIOMAS - Benign normal skin lesions - Benign-appearing - Call for any changes  MELANOCYTIC NEVI - Tan-brown and/or pink-flesh-colored symmetric macules and papules - Benign appearing on exam today - Observation -  Call clinic for new or changing moles - Recommend daily use of broad spectrum spf 30+ sunscreen to sun-exposed areas.   History of Dysplastic Nevi - No evidence of recurrence today - Recommend regular full body skin exams - Recommend daily broad spectrum sunscreen SPF 30+ to sun-exposed areas, reapply every 2 hours as needed.  - Call if any new or changing lesions are noted between office visits  INTERTRIGO Exam Erythematous macerated patches groin, buttocks  Chronic and persistent condition with duration or expected duration over one year. Condition is symptomatic / bothersome to patient. Not to goal.    Intertrigo is a chronic recurrent rash that occurs in skin fold areas that may be associated with friction; heat; moisture; yeast; fungus; and bacteria.  It is exacerbated by increased movement / activity; sweating; and higher atmospheric temperature.  Treatment Plan Start [facility] Iodoquinol 1%, Hydrocortisone 2.5%, Niacinamide 2% Cream twice a day to affected areas for up to two weeks.  The patient was advised this is not covered by insurance since it is made by a compounding pharmacy. They will receive an email to check out and the medication will be mailed to their home.         Return in about 6 months (around 09/17/2023) for TBSE.  I, Joanie Coddington, CMA, am acting as scribe for Armida Sans, MD .   Documentation: I have reviewed the above documentation for accuracy and completeness, and I agree with the above.  Armida Sans, MD

## 2023-03-17 NOTE — Patient Instructions (Addendum)
May use Differin 0.1% (Adapalene) gel at bedtime to affected areas of face.  Instructions for [facility] Medications  One or more of your medications was sent to the [facility] mail order compounding pharmacy. You will receive an email from them and can purchase the medicine through that link. It will then be mailed to your home at the address you confirmed. If for any reason you do not receive an email from them, please check your spam folder. If you still do not find the email, please let us know. [facility] phone number is [phone].  Due to recent changes in healthcare laws, you may see results of your pathology and/or laboratory studies on MyChart before the doctors have had a chance to review them. We understand that in some cases there may be results that are confusing or concerning to you. Please understand that not all results are received at the same time and often the doctors may need to interpret multiple results in order to provide you with the best plan of care or course of treatment. Therefore, we ask that you please give [place] 2 business days to thoroughly review all your results before contacting the office for clarification. Should we see a critical lab result, you will be contacted sooner.   If You Need Anything After Your Visit  If you have any questions or concerns for your doctor, please call our main line at [phone] and press option 4 to reach your doctor's medical assistant. If no one answers, please leave a voicemail as directed and we will return your call as soon as possible. Messages left after 4 pm will be answered the following business day.   You may also send [place] a message via MyChart. We typically respond to MyChart messages within 1-2 business days.  For prescription refills, please ask your pharmacy to contact our office. Our fax number is [phone].  If you have an urgent issue when the clinic is closed that cannot wait until the next business  day, you can [place] your doctor at the number below.    Please note that while we do our best to be available for urgent issues outside of office hours, we are not available 24/7.   If you have an urgent issue and are unable to reach [place], you may choose to seek medical care at your doctor's office, retail clinic, urgent care center, or emergency room.  If you have a medical emergency, please immediately call 911 or go to the emergency department.  Pager Numbers  - Dr. Gwen Pounds: [phone]  - Dr. Roseanne Reno: [phone]  In the event of inclement weather, please call our main line at [phone] for an update on the status of any delays or closures.  Dermatology Medication Tips: Please keep the boxes that topical medications come in in order to help keep track of the instructions about where and how to use these. Pharmacies typically print the medication instructions only on the boxes and not directly on the medication tubes.   If your medication is too expensive, please contact our office at [phone] option 4 or send [place] a message through MyChart.   We are unable to tell what your co-pay for medications will be in advance as this is different depending on your insurance coverage. However, we may be able to find a substitute medication at lower cost or fill out paperwork to get insurance to cover a needed medication.   If a prior authorization is required to get your medication  covered by your insurance company, please allow [place] 1-2 business days to complete this process.  Drug prices often vary depending on where the prescription is filled and some pharmacies may offer cheaper prices.  The website www.goodrx.com contains coupons for medications through different pharmacies. The prices here do not account for what the cost may be with help from insurance (it may be cheaper with your insurance), but the website can give you the price if you did not use any insurance.  - You can print the  associated coupon and take it with your prescription to the pharmacy.  - You may also stop by our office during regular business hours and pick up a GoodRx coupon card.  - If you need your prescription sent electronically to a different pharmacy, notify our office through [facility] or by phone at [phone] option 4.     Si Usted Necesita Algo Despus de Su Visita  Tambin puede enviarnos un mensaje a travs de Clinical cytogeneticist. Por lo general respondemos a los mensajes de MyChart en el transcurso de 1 a 2 das hbiles.  Para renovar recetas, por favor pida a su farmacia que se ponga en contacto con nuestra oficina. Annie Sable de fax es [place] [phone].  Si tiene un asunto urgente cuando la clnica est cerrada y que no puede esperar hasta el siguiente da hbil, puede llamar/localizar a su doctor(a) al nmero que aparece a continuacin.   Por favor, tenga en cuenta que aunque hacemos todo lo posible para estar disponibles para asuntos urgentes fuera del horario de [place], no estamos disponibles las 24 horas del da, los 7 [address]  [address] de la [place].   Si tiene un problema urgente y no puede comunicarse con nosotros, puede optar por buscar atencin mdica  en el consultorio de su doctor(a), en una clnica privada, en un centro de atencin urgente o en una sala de emergencias.  Si tiene Engineer, drilling, por favor llame inmediatamente al 911 o vaya a la sala de emergencias.  Nmeros de bper  - Dr. Gwen Pounds: [phone]  - Dra. Roseanne Reno: [phone]  En caso de inclemencias del [place], por favor llame a Lacy Duverney principal al [phone] para una actualizacin sobre el [place] de cualquier retraso o cierre.  Consejos para la medicacin en dermatologa: Por favor, guarde las cajas en las que vienen los medicamentos de uso tpico para ayudarle a seguir las instrucciones sobre dnde y cmo usarlos. Las farmacias generalmente imprimen las instrucciones del medicamento slo en las  cajas y no directamente en los tubos del [place].   Si su medicamento es muy caro, por favor, pngase en contacto con Rolm [place] llamando al [phone] y presione la opcin 4 o envenos un mensaje a travs de Clinical cytogeneticist.   No podemos decirle cul ser su copago por los medicamentos por adelantado ya que esto es diferente dependiendo de la cobertura de su seguro. Sin embargo, es posible que podamos encontrar un medicamento sustituto a Audiological scientist un formulario para que el seguro cubra el medicamento que se considera necesario.   Si se requiere una autorizacin previa para que su compaa de seguros [place] su medicamento, por favor permtanos de 1 a 2 das hbiles para completar [address]  Los precios de los medicamentos varan con frecuencia dependiendo del Environmental consultant de dnde se surte la receta y alguna farmacias pueden ofrecer precios ms baratos.  El sitio web www.goodrx.com tiene cupones para medicamentos de Health and safety inspector. Los precios aqu no tienen en cuenta lo  que podra costar con la ayuda del seguro (puede ser ms barato con su seguro), pero el sitio web puede darle el precio si no Visual merchandiser.  - Puede imprimir el cupn correspondiente y llevarlo con su receta a la farmacia.  - Tambin puede pasar por nuestra oficina durante el horario de atencin regular y Education officer, museum una tarjeta de cupones de GoodRx.  - Si necesita que su receta se enve electrnicamente a una farmacia diferente, informe a nuestra oficina a travs de MyChart de [place] o por telfono llamando al [phone] y presione la opcin 4.

## 2023-03-18 ENCOUNTER — Other Ambulatory Visit: Payer: Self-pay

## 2023-03-19 ENCOUNTER — Other Ambulatory Visit: Payer: Self-pay

## 2023-03-19 MED ORDER — WEGOVY 2.4 MG/0.75ML ~~LOC~~ SOAJ
2.4000 mg | SUBCUTANEOUS | 5 refills | Status: DC
  Filled 2023-03-19: qty 3, 28d supply, fill #0

## 2023-03-21 ENCOUNTER — Other Ambulatory Visit: Payer: Self-pay

## 2023-03-24 ENCOUNTER — Other Ambulatory Visit: Payer: Self-pay

## 2023-03-24 MED ORDER — PANTOPRAZOLE SODIUM 40 MG PO TBEC
40.0000 mg | DELAYED_RELEASE_TABLET | Freq: Every day | ORAL | 3 refills | Status: DC
  Filled 2023-03-24: qty 30, 30d supply, fill #0
  Filled 2023-04-23: qty 30, 30d supply, fill #1

## 2023-03-24 MED ORDER — ONDANSETRON 4 MG PO TBDP
4.0000 mg | ORAL_TABLET | Freq: Three times a day (TID) | ORAL | 0 refills | Status: DC | PRN
  Filled 2023-03-24: qty 20, 7d supply, fill #0

## 2023-03-26 ENCOUNTER — Encounter: Payer: Self-pay | Admitting: Dermatology

## 2023-03-30 ENCOUNTER — Other Ambulatory Visit: Payer: Self-pay

## 2023-03-30 MED ORDER — WEGOVY 1.7 MG/0.75ML ~~LOC~~ SOAJ
1.7000 mg | SUBCUTANEOUS | 2 refills | Status: DC
  Filled 2023-03-30: qty 3, 28d supply, fill #0

## 2023-04-01 ENCOUNTER — Encounter: Payer: BC Managed Care – PPO | Attending: Family Medicine | Admitting: Dietician

## 2023-04-01 ENCOUNTER — Encounter: Payer: Self-pay | Admitting: Dietician

## 2023-04-01 VITALS — Ht 63.0 in | Wt 268.4 lb

## 2023-04-01 DIAGNOSIS — L409 Psoriasis, unspecified: Secondary | ICD-10-CM | POA: Diagnosis not present

## 2023-04-01 DIAGNOSIS — K219 Gastro-esophageal reflux disease without esophagitis: Secondary | ICD-10-CM

## 2023-04-01 DIAGNOSIS — L4050 Arthropathic psoriasis, unspecified: Secondary | ICD-10-CM

## 2023-04-01 NOTE — Progress Notes (Signed)
Medical Nutrition Therapy: Visit start time: 0815  end time: 0915  Assessment:   Referral Diagnosis: intertrigo, GERD, kidney stones, obesity, hypothyroid, psotriasis Other medical history/ diagnoses: none significant Psychosocial issues/ stress concerns: anxiety, depression  Medications, supplements: reconciled list in medical record   Preferred learning method:  Auditory Visual Hands-on  Current weight: 268.4lbs Height: 5'3" BMI: 47.54   Progress and evaluation:  Patient states highest weight was about 330lbs, then began losing weight with lifestyle changes. Also started wegovy recently that is helping with more weight loss but wants to develop habits for ongoing weight loss after.  Patient reports she was in prediabetes range with HbA1C for several years, but with weight loss and semaglutide has now improved to normal range. Had HbA1C of 5.2% 06/11/22 Skin issues have improved with medical treatment.  She reports family history of "widow maker" MI in dad and grandfather; wants to follow heart healthy habits. Food allergies: recent IgG testing reflected reaction to whey, casein, wheat, gluten, eggs She has been avoiding whey since then Special diet practices: none Patient seeks help with navigating food sensitivities Next PCP appt is 06/2023   Dietary Intake:  Usual eating pattern includes 3 meals and 1-2 snacks per day. Dining out frequency: 2-3 meals per week. Who plans meals/ buys groceries? Self, spouse Who prepares meals? Self, spouse  Breakfast: fairtlife protein shake + jimmy dean delights sandwich Snack: none Lunch: grilled chicken + potato + veg cooked or salad Snack: fruit ie berries; no added sugar fruit cup; baked chips; rarely [employer] chips Supper: same as lunch Snack: weight watchers fudge bar Beverages: water, some with propel   Physical activity: bootcamp, walking, personal trainer 45-60 minutes, 5x a week total   Intervention:   Nutrition Care Education:    Basic nutrition: appropriate nutrient balance; appropriate meal and snack schedule; general nutrition guidelines    Weight control: importance of low sugar and low fat choices; portion control strategies including use of small plates/ food containers; estimated energy needs for weight loss at 1300-1400 kcal, provided guidance for 45% CHO, 25% pro, 30% fat; continuance of portion control and low fat, low sugar choices for long term weight loss after weaning off weight loss medication Food sensitivities: limiting whey, gluten, dairy intake Psoriatic arthritis: Mediterranean eating pattern for anti-inflammatory as well as heart healthy benefits  Other intervention notes: Patient has been working on positive and appropriate lifestyle changes to promote weight loss and reduce health risk. She is motivated to continue. No follow up scheduled at this time, patient will schedule later as needed.   Nutritional Diagnosis:  [place]-2.1 Inpaired nutrition utilization and [place]-2.2 Altered nutrition-related laboratory As related to history of prediabetes, GERD, possible food sensitivities.  As evidenced by history of elevated HbA1C, IgG testing results, GERD controlled with medication. [place]-3.3 Overweight/obesity As related to history of excess calories, emotional eating.  As evidenced by patient with current BMI of 47.54, in weight loss pattern.   Education Materials given:  Designer, industrial/product with food lists, sample meal pattern Sample menus Milk allergy Nutrition Therapy (NCM) for list of milk/ whey containing foods Wheat allergy Nutrition Therapy (NCM) for list of wheat containing foods Visit summary with goals/ instructions to be viewed via patient portal   Learner/ who was taught:  Patient   Level of understanding: Verbalizes/ demonstrates competency  Demonstrated degree of understanding via:   Teach back Learning barriers: None  Willingness to learn/ readiness for change: Eager, change in  progress  Monitoring and Evaluation:  Dietary  intake, exercise, inflammatory symptoms, and body weight      follow up: prn

## 2023-04-01 NOTE — Patient Instructions (Signed)
Continue with current eating pattern and healthy food choices.  Avoid at least large portions of wheat, whey, or eggs.  Great job with regular exercise, keep it up!!

## 2023-04-14 ENCOUNTER — Other Ambulatory Visit: Payer: Self-pay

## 2023-04-14 MED ORDER — ALPRAZOLAM 0.5 MG PO TABS
0.5000 mg | ORAL_TABLET | Freq: Every evening | ORAL | 1 refills | Status: DC | PRN
  Filled 2023-04-14: qty 90, 90d supply, fill #0

## 2023-04-14 MED ORDER — METHYLPREDNISOLONE 4 MG PO TBPK
ORAL_TABLET | ORAL | 0 refills | Status: DC
  Filled 2023-04-14: qty 21, 6d supply, fill #0

## 2023-04-16 ENCOUNTER — Encounter: Payer: Self-pay | Admitting: Pharmacist

## 2023-04-16 ENCOUNTER — Other Ambulatory Visit: Payer: Self-pay

## 2023-04-22 ENCOUNTER — Other Ambulatory Visit: Payer: Self-pay

## 2023-04-23 ENCOUNTER — Other Ambulatory Visit: Payer: Self-pay

## 2023-04-23 MED ORDER — WEGOVY 2.4 MG/0.75ML ~~LOC~~ SOAJ
2.4000 mg | SUBCUTANEOUS | 2 refills | Status: DC
  Filled 2023-04-23: qty 3, 28d supply, fill #0
  Filled 2023-05-28: qty 3, 28d supply, fill #1

## 2023-05-12 ENCOUNTER — Other Ambulatory Visit: Payer: Self-pay

## 2023-05-28 ENCOUNTER — Other Ambulatory Visit: Payer: Self-pay

## 2023-05-28 ENCOUNTER — Other Ambulatory Visit: Payer: Self-pay | Admitting: Orthopedic Surgery

## 2023-05-28 DIAGNOSIS — M19011 Primary osteoarthritis, right shoulder: Secondary | ICD-10-CM

## 2023-05-28 DIAGNOSIS — G8929 Other chronic pain: Secondary | ICD-10-CM

## 2023-05-28 MED ORDER — HYDROCHLOROTHIAZIDE 25 MG PO TABS
25.0000 mg | ORAL_TABLET | Freq: Every day | ORAL | 0 refills | Status: DC
  Filled 2023-05-28: qty 90, 90d supply, fill #0

## 2023-05-30 ENCOUNTER — Ambulatory Visit
Admission: RE | Admit: 2023-05-30 | Discharge: 2023-05-30 | Disposition: A | Discharge status: Home / Self Care | Payer: BC Managed Care – PPO | Source: Ambulatory Visit | Attending: Orthopedic Surgery | Admitting: Orthopedic Surgery

## 2023-05-30 DIAGNOSIS — M25511 Pain in right shoulder: Secondary | ICD-10-CM | POA: Diagnosis present

## 2023-05-30 DIAGNOSIS — M19011 Primary osteoarthritis, right shoulder: Secondary | ICD-10-CM | POA: Diagnosis present

## 2023-05-30 DIAGNOSIS — G8929 Other chronic pain: Secondary | ICD-10-CM

## 2023-06-17 ENCOUNTER — Other Ambulatory Visit: Payer: Self-pay | Admitting: Family Medicine

## 2023-06-17 DIAGNOSIS — Z1231 Encounter for screening mammogram for malignant neoplasm of breast: Secondary | ICD-10-CM

## 2023-06-21 ENCOUNTER — Other Ambulatory Visit: Payer: Self-pay | Admitting: Orthopedic Surgery

## 2023-06-23 ENCOUNTER — Other Ambulatory Visit: Payer: Self-pay

## 2023-06-23 MED ORDER — BUPROPION HCL ER (XL) 300 MG PO TB24
300.0000 mg | ORAL_TABLET | Freq: Every day | ORAL | 1 refills | Status: DC
  Filled 2023-06-23: qty 90, 90d supply, fill #0
  Filled 2023-11-24: qty 90, 90d supply, fill #1

## 2023-06-23 MED ORDER — CELECOXIB 100 MG PO CAPS
100.0000 mg | ORAL_CAPSULE | Freq: Every day | ORAL | 1 refills | Status: AC
  Filled 2023-06-23: qty 90, 90d supply, fill #0

## 2023-06-23 MED ORDER — LEVOTHYROXINE SODIUM 50 MCG PO TABS
50.0000 ug | ORAL_TABLET | Freq: Every day | ORAL | 1 refills | Status: AC
  Filled 2023-06-23: qty 90, 90d supply, fill #0
  Filled 2023-09-23: qty 90, 90d supply, fill #1

## 2023-06-23 MED ORDER — PANTOPRAZOLE SODIUM 40 MG PO TBEC
40.0000 mg | DELAYED_RELEASE_TABLET | Freq: Every day | ORAL | 0 refills | Status: DC
  Filled 2023-06-23: qty 90, 90d supply, fill #0

## 2023-06-23 MED ORDER — ALPRAZOLAM 0.5 MG PO TABS
0.5000 mg | ORAL_TABLET | Freq: Every evening | ORAL | 1 refills | Status: DC | PRN
  Filled 2023-07-26: qty 90, 90d supply, fill #0
  Filled 2023-12-13: qty 90, 90d supply, fill #1

## 2023-06-23 MED ORDER — HYDROCHLOROTHIAZIDE 25 MG PO TABS
25.0000 mg | ORAL_TABLET | Freq: Every day | ORAL | 0 refills | Status: DC
  Filled 2023-06-23 – 2023-09-23 (×2): qty 90, 90d supply, fill #0

## 2023-06-23 MED ORDER — WEGOVY 2.4 MG/0.75ML ~~LOC~~ SOAJ
2.4000 mg | SUBCUTANEOUS | 2 refills | Status: DC
  Filled 2023-06-23: qty 3, 28d supply, fill #0
  Filled 2023-07-26: qty 3, 28d supply, fill #1
  Filled 2023-08-27: qty 3, 28d supply, fill #2

## 2023-06-24 ENCOUNTER — Other Ambulatory Visit: Payer: Self-pay

## 2023-06-24 MED ORDER — NITROFURANTOIN MONOHYD MACRO 100 MG PO CAPS
100.0000 mg | ORAL_CAPSULE | Freq: Two times a day (BID) | ORAL | 0 refills | Status: DC
  Filled 2023-06-24: qty 14, 7d supply, fill #0

## 2023-06-28 ENCOUNTER — Ambulatory Visit
Admission: RE | Admit: 2023-06-28 | Discharge: 2023-06-28 | Disposition: A | Discharge status: Home / Self Care | Payer: BC Managed Care – PPO | Attending: Urology | Admitting: Urology

## 2023-06-28 ENCOUNTER — Ambulatory Visit
Admission: RE | Admit: 2023-06-28 | Discharge: 2023-06-28 | Disposition: A | Discharge status: Home / Self Care | Payer: BC Managed Care – PPO | Source: Ambulatory Visit | Attending: Urology | Admitting: Urology

## 2023-06-28 DIAGNOSIS — N200 Calculus of kidney: Secondary | ICD-10-CM | POA: Diagnosis present

## 2023-06-29 ENCOUNTER — Ambulatory Visit
Admission: RE | Admit: 2023-06-29 | Discharge: 2023-06-29 | Disposition: A | Discharge status: Home / Self Care | Payer: BC Managed Care – PPO | Source: Ambulatory Visit | Attending: Family Medicine | Admitting: Family Medicine

## 2023-06-29 DIAGNOSIS — Z1231 Encounter for screening mammogram for malignant neoplasm of breast: Secondary | ICD-10-CM | POA: Diagnosis present

## 2023-07-01 ENCOUNTER — Encounter: Payer: Self-pay | Admitting: Urology

## 2023-07-01 ENCOUNTER — Ambulatory Visit: Payer: BC Managed Care – PPO | Admitting: Urology

## 2023-07-01 VITALS — BP 130/78 | HR 80 | Ht 63.0 in | Wt 262.0 lb

## 2023-07-01 DIAGNOSIS — N200 Calculus of kidney: Secondary | ICD-10-CM | POA: Diagnosis not present

## 2023-07-01 NOTE — Progress Notes (Signed)
I, Kelsey Randolph, acting as a scribe for Kelsey Altes, MD., have documented all relevant documentation on the behalf of Kelsey Altes, MD, as directed by Kelsey Altes, MD while in the presence of Kelsey Altes, MD.  07/01/2023 4:28 PM   Kelsey Randolph Sep 12, 1975 [id number]  Referring provider: Alm Bustard, NP [address] [place],  [place] [address]  Chief Complaint  Patient presents with   Nephrolithiasis   Urologic history: 1.  Left nephrolithiasis Nonobstructing left renal calculi incidentally noted on abdominal CT performed in the ED/3/23 for right abdominal pain History ureteroscopic stone removal 2004 Largest calculus 10 mm CT urogram with calyceal obstruction and duplicated collecting system Asymptomatic and elected observation  HPI: Kelsey Randolph is a 47 y.o. female presents for annual follow-up.  Saw PCP last on 11/12 with some complaints of mild left flank pain and lower urinary tract symptoms. Urinalysis showed pyuria. She was started on Macrobid without improvement and subsequently switched to Bactrim, which she is presently taking.  A urine culture was not ordered.  She has had resolution of her symptoms.   PMH: Past Medical History:  Diagnosis Date   Allergy    Anxiety    Arthritis    PSORIATIC   Borderline diabetes    Complication of anesthesia    Depression    Dysplastic nevus 07/27/2019   Right lateral bicep. Severe atypia, close to margin. Excised 11/14/2019, margins free.   Dysplastic nevus 07/30/2021   right mid back paraspinal, mod atypia   Dysplastic nevus 07/30/2022   Right Mid Buttocks, Severe atypia, Excised 09/08/22   GERD (gastroesophageal reflux disease)    RARE-NO MEDS   History of kidney stones 2004   History of tobacco use    Hx of dysplastic nevus 07/16/2016   Right superior medial buttocks just lateral to superior crease. Mild atypia clost to deep margin   Hx of dysplastic nevus 07/12/2017   Left upper  back sup. scapula. Severe atypia with focal fibrosis, close to lateral margin. Excised: 09/28/2017. Margins free   Hx of dysplastic nevus 07/12/2017   Left LQA periumbilical. Moderate atypia, irritated, close to lateral margin   Hx of dysplastic nevus 07/27/2019, exc 11/14/19   Right lateral bicep. Severe atypia, close to margin   Hypothyroidism    Morbidly obese (HCC)    PONV (postoperative nausea and vomiting)    Psoriasis    Cosentyx   Tachycardia     Surgical History: Past Surgical History:  Procedure Laterality Date   FOOT SURGERY Left    kidney stones removed  2004   TARSAL TUNNEL RELEASE Left 04/16/2017   Procedure: TARSAL TUNNEL RELEASE;  Surgeon: Gwyneth Revels, DPM;  Location: [facility];  Service: Podiatry;  Laterality: Left;   TENDON REPAIR Left 04/16/2017   Procedure: FLEXOR TENDON REPAIR-SECONDARY ;  Surgeon: Gwyneth Revels, DPM;  Location: [facility];  Service: Podiatry;  Laterality: Left;   tonsillectomy and adenoidectomy  1993    Home Medications:  Allergies as of 07/01/2023   No Known Allergies      Medication List        Accurate as of July 01, 2023  4:28 PM. If you have any questions, ask your nurse or doctor.          STOP taking these medications    clotrimazole 1 % cream Commonly known as: LOTRIMIN Stopped by: Kelsey Randolph   ketoconazole 2 % cream Commonly known as: NIZORAL Stopped  by: Kelsey Randolph   ketoconazole 200 MG tablet Commonly known as: NIZORAL Stopped by: Kelsey Randolph       TAKE these medications    ALPRAZolam 0.5 MG tablet Commonly known as: XANAX Take 1 tablet (0.5 mg total) by mouth at bedtime as needed for anxiety   buPROPion 300 MG 24 hr tablet Commonly known as: WELLBUTRIN XL Take 1 tablet (300 mg total) by mouth daily.   celecoxib 100 MG capsule Commonly known as: CELEBREX Take 1 capsule (100 mg total) by mouth daily as needed for pain What changed:  when to take this reasons to take this   cetirizine  10 MG tablet Commonly known as: ZYRTEC Take 10 mg by mouth every morning.   Cosentyx Sensoready (300 MG) 150 MG/ML Soaj Generic drug: Secukinumab (300 MG Dose) Inject 300 mg into the skin every 28 (twenty-eight) days.   cyanocobalamin 1000 MCG tablet Commonly known as: VITAMIN B12 Take 1,000 mcg by mouth daily.   cyclobenzaprine 10 MG tablet Commonly known as: FLEXERIL Take 1 tablet (10 mg total) by mouth 2 (two) times daily as needed for Muscle spasms   docusate sodium 100 MG capsule Commonly known as: COLACE Take 100 mg by mouth 2 (two) times daily.   FISH OIL PO Take 2,000 Units by mouth daily.   hydrochlorothiazide 25 MG tablet Commonly known as: HYDRODIURIL Take 1 tablet (25 mg total) by mouth once daily   levothyroxine 50 MCG tablet Commonly known as: SYNTHROID Take 1 tablet (50 mcg total) by mouth daily. Take on an empty stomach with a glass of water at least 30-60 minutes before breakfast.   neomycin-polymyxin b-dexamethasone 3.5-10000-0.1 Susp Commonly known as: MAXITROL Place 1 drop into affected eye 3 (three) times daily. What changed:  when to take this reasons to take this   ondansetron 4 MG disintegrating tablet Commonly known as: ZOFRAN-ODT Take 1 tablet (4 mg total) by mouth every 8 (eight) hours as needed for nausea or vomiting.   pantoprazole 40 MG tablet Commonly known as: PROTONIX Take 1 tablet (40 mg total) by mouth once daily for 90 days What changed:  when to take this reasons to take this   potassium chloride 10 MEQ tablet Commonly known as: KLOR-CON Take 1 tablet (10 mEq total) by mouth once daily   PROBIOTIC PO Take 1 capsule by mouth daily.   sulfamethoxazole-trimethoprim 800-160 MG tablet Commonly known as: BACTRIM DS Take 1 tablet by mouth 2 (two) times daily.   sulfaSALAzine 500 MG tablet Commonly known as: AZULFIDINE Take 500 mg by mouth 2 (two) times daily.   Tri-VyLibra Lo 0.18/0.215/0.25 MG-25 MCG Tabs Generic drug:  Norgestimate-Eth Estradiol Take 1 tablet by mouth once daily   Unifine Pentips 31G X 8 MM Misc Generic drug: Insulin Pen Needle Use as directed (Use as directed with mounjaro injections)   VITAMIN D-VITAMIN K PO Take 1 tablet by mouth daily at 6 (six) AM.   Wegovy 2.4 MG/0.75ML Soaj Generic drug: Semaglutide-Weight Management Inject 2.4 mg into the skin once a week.        Allergies: No Known Allergies  Family History: Family History  Problem Relation Age of Onset   Depression Mother    Hyperlipidemia Mother    Hypertension Mother    Healthy Brother    Heart disease Paternal Grandfather        MI; CAD   Heart disease Father    Dementia Maternal Grandmother    Diabetes Maternal Grandmother  Breast cancer Neg Hx     Social History:  reports that she quit smoking about 7 years ago. Her smoking use included cigarettes. She started smoking about 27 years ago. She has a 10 pack-year smoking history. She has never used smokeless tobacco. She reports current alcohol use. She reports that she does not use drugs.   Physical Exam: BP 130/78   Pulse 80   Ht 5\' 3"  (1.6 m)   Wt 262 lb (118.8 kg)   LMP 06/02/2023   BMI 46.41 kg/m   Constitutional:  Alert and oriented, No acute distress. HEENT:  AT Respiratory: Normal respiratory effort, no increased work of breathing. Psychiatric: Normal mood and affect.  Urinalysis 3-10 RBC   Pertinent Imaging: KUB performed earlier today was personally reviewed and interpreted. Stable left lower pole calculus compared with the previous KUB of November 2023 and with prior CT. The KUB has not been interpreted by radiology.    Assessment & Plan:    1. Nephrolithiasis Stable Asymptomatic and desires observation 1 year follow-up with KUB; call earlier for flank pain, hematuria   I have reviewed the above documentation for accuracy and completeness, and I agree with the above.   Kelsey Altes, MD  [facility] [address] [place], [place] [address] [phone]

## 2023-07-02 LAB — URINALYSIS, COMPLETE
Bilirubin, UA: NEGATIVE
Glucose, UA: NEGATIVE
Ketones, UA: NEGATIVE
Leukocytes,UA: NEGATIVE
Nitrite, UA: NEGATIVE
Protein,UA: NEGATIVE
Specific Gravity, UA: <1.005 — ABNORMAL LOW (ref 1.005–1.030)
Urobilinogen, Ur: 0.2 mg/dL (ref 0.2–1.0)
pH, UA: 5.5 (ref 5.0–7.5)

## 2023-07-02 LAB — MICROSCOPIC EXAMINATION

## 2023-07-04 ENCOUNTER — Encounter: Payer: Self-pay | Admitting: Urology

## 2023-07-05 ENCOUNTER — Other Ambulatory Visit: Payer: Self-pay

## 2023-07-06 ENCOUNTER — Other Ambulatory Visit: Payer: Self-pay

## 2023-07-06 ENCOUNTER — Encounter
Admission: RE | Admit: 2023-07-06 | Discharge: 2023-07-06 | Disposition: A | Discharge status: Home / Self Care | Payer: BC Managed Care – PPO | Source: Ambulatory Visit | Attending: Orthopedic Surgery | Admitting: Orthopedic Surgery

## 2023-07-06 VITALS — Ht 63.0 in | Wt 264.0 lb

## 2023-07-06 DIAGNOSIS — Z01812 Encounter for preprocedural laboratory examination: Secondary | ICD-10-CM

## 2023-07-06 NOTE — Patient Instructions (Addendum)
Your procedure is scheduled on: Tuesday, December 3 Report to the Registration Desk on the 1st floor of the [facility] To find out your arrival time, please call [phone] between 1PM - 3PM on: Monday, December 2 If your arrival time is 6:00 am, do not arrive before that time as the [facility] entrance doors do not open until 6:00 am.  REMEMBER: Instructions that are not followed completely may result in serious medical risk, up to and including death; or upon the discretion of your surgeon and anesthesiologist your surgery may need to be rescheduled.  Do not eat food after midnight the night before surgery.  No gum chewing or hard candies.  You may however, drink CLEAR liquids up to 2 hours before you are scheduled to arrive for your surgery. Do not drink anything within 2 hours of your scheduled arrival time.  Clear liquids include: - water  - apple juice without pulp - gatorade (not RED colors) - black coffee or tea (Do NOT add milk or creamers to the coffee or tea) Do NOT drink anything that is not on this list.  In addition, your doctor has ordered for you to drink the provided:  Ensure Pre-Surgery Clear Carbohydrate Drink  Drinking this carbohydrate drink up to two hours before surgery helps to reduce insulin resistance and improve patient outcomes. Please complete drinking 2 hours before scheduled arrival time.  One week prior to surgery: starting November 26 Stop Anti-inflammatories (NSAIDS) such as Advil, Aleve, Ibuprofen, Motrin, Naproxen, Naprosyn and Aspirin based products such as Excedrin, Goody's Powder, BC Powder. Stop ANY OVER THE COUNTER supplements until after surgery. Stop fish oil, probiotic, vitamin D/K.  You may however, continue to take Tylenol if needed for pain up until the day of surgery.  UJWJXB - hold 7 days before surgery. Resume AFTER surgery on your regular weekly day, Monday, December 9  Continue taking all of your other prescription  medications up until the day of surgery.  ON THE DAY OF SURGERY ONLY TAKE THESE MEDICATIONS WITH SIPS OF WATER:  Bupropion Levothyroxine Pantoprazole Potassium Alprazolam (Xanax) if needed for anxiety  No Alcohol for 24 hours before or after surgery.  No Smoking including e-cigarettes for 24 hours before surgery.  No chewable tobacco products for at least 6 hours before surgery.  No nicotine patches on the day of surgery.  Do not use any "recreational" drugs for at least a week (preferably 2 weeks) before your surgery.  Please be advised that the combination of cocaine and anesthesia may have negative outcomes, up to and including death. If you test positive for cocaine, your surgery will be cancelled.  On the morning of surgery brush your teeth with toothpaste and water, you may rinse your mouth with mouthwash if you wish. Do not swallow any toothpaste or mouthwash.  Use CHG Soap as directed on instruction sheet.  Do not wear jewelry, make-up, hairpins, clips or nail polish.  For welded (permanent) jewelry: bracelets, anklets, waist bands, etc.  Please have this removed prior to surgery.  If it is not removed, there is a chance that hospital personnel will need to cut it off on the day of surgery.  Do not wear lotions, powders, or perfumes. No deodorant.  Do not shave body hair from the neck down 48 hours before surgery.  Contact lenses, hearing aids and dentures may not be worn into surgery.  Do not bring valuables to the hospital. [facility] is not responsible for any missing/lost belongings  or valuables.   Notify your doctor if there is any change in your medical condition (cold, fever, infection).  Wear comfortable clothing (specific to your surgery type) to the hospital.  After surgery, you can help prevent lung complications by doing breathing exercises.  Take deep breaths and cough every 1-2 hours. Your doctor may order a device called an Incentive Spirometer to  help you take deep breaths.  If you are being discharged the day of surgery, you will not be allowed to drive home. You will need a responsible individual to drive you home and stay with you for 24 hours after surgery.   If you are taking public transportation, you will need to have a responsible individual with you.  Please call the Pre-admissions Testing Dept. at [phone] if you have any questions about these instructions.  Surgery Visitation Policy:  Patients having surgery or a procedure may have two visitors.  Children under the age of 80 must have an adult with them who is not the patient.      Preparing for Surgery with CHLORHEXIDINE GLUCONATE (CHG) Soap  Chlorhexidine Gluconate (CHG) Soap  o An antiseptic cleaner that kills germs and bonds with the skin to continue killing germs even after washing  o Used for showering the night before surgery and morning of surgery  Before surgery, you can play an important role by reducing the number of germs on your skin.  CHG (Chlorhexidine gluconate) soap is an antiseptic cleanser which kills germs and bonds with the skin to continue killing germs even after washing.  Please do not use if you have an allergy to CHG or antibacterial soaps. If your skin becomes reddened/irritated stop using the CHG.  1. Shower the NIGHT BEFORE SURGERY and the MORNING OF SURGERY with CHG soap.  2. If you choose to wash your hair, wash your hair first as usual with your normal shampoo.  3. After shampooing, rinse your hair and body thoroughly to remove the shampoo.  4. Use CHG as you would any other liquid soap. You can apply CHG directly to the skin and wash gently with a scrungie or a clean washcloth.  5. Apply the CHG soap to your body only from the neck down. Do not use on open wounds or open sores. Avoid contact with your eyes, ears, mouth, and genitals (private parts). Wash face and genitals (private parts) with your normal soap.  6. Wash  thoroughly, paying special attention to the area where your surgery will be performed.  7. Thoroughly rinse your body with warm water.  8. Do not shower/wash with your normal soap after using and rinsing off the CHG soap.  9. Pat yourself dry with a clean towel.  10. Wear clean pajamas to bed the night before surgery.  12. Place clean sheets on your bed the night of your first shower and do not sleep with pets.  13. Shower again with the CHG soap on the day of surgery prior to arriving at the hospital.  14. Do not apply any deodorants/lotions/powders.  15. Please wear clean clothes to the hospital.

## 2023-07-07 ENCOUNTER — Ambulatory Visit: Payer: BC Managed Care – PPO | Admitting: Urology

## 2023-07-12 MED ORDER — CHLORHEXIDINE GLUCONATE 0.12 % MT SOLN
15.0000 mL | Freq: Once | OROMUCOSAL | Status: AC
  Administered 2023-07-13: 15 mL via OROMUCOSAL

## 2023-07-12 MED ORDER — ACETAMINOPHEN 500 MG PO TABS
1000.0000 mg | ORAL_TABLET | Freq: Once | ORAL | Status: AC
  Administered 2023-07-13: 1000 mg via ORAL

## 2023-07-12 MED ORDER — LACTATED RINGERS IV SOLN
INTRAVENOUS | Status: DC
Start: 2023-07-12 — End: 2023-07-13

## 2023-07-12 MED ORDER — ORAL CARE MOUTH RINSE: 15.0000 mL | Freq: Once | OROMUCOSAL | Status: AC

## 2023-07-12 MED ORDER — CEFAZOLIN IN SODIUM CHLORIDE 3-0.9 GM/100ML-% IV SOLN
3.0000 g | INTRAVENOUS | Status: DC
  Filled 2023-07-12 (×2): qty 100

## 2023-07-12 NOTE — Anesthesia Preprocedure Evaluation (Addendum)
Anesthesia Evaluation  Patient identified by MRN, date of birth, ID band Patient awake    Reviewed: Allergy & Precautions, NPO status , Patient's Chart, lab work & pertinent test results  History of Anesthesia Complications (+) PONV and history of anesthetic complications  Airway Mallampati: II  TM Distance: >3 FB Neck ROM: Full    Dental no notable dental hx. (+) Teeth Intact   Pulmonary neg sleep apnea, neg COPD, Patient abstained from smoking.Not current smoker, former smoker   Pulmonary exam normal breath sounds clear to auscultation       Cardiovascular Exercise Tolerance: Good METS(-) hypertension(-) CAD and (-) Past MI negative cardio ROS (-) dysrhythmias  Rhythm:Regular Rate:Normal - Systolic murmurs    Neuro/Psych  PSYCHIATRIC DISORDERS Anxiety Depression    negative neurological ROS     GI/Hepatic ,GERD  Controlled,,(+)     (-) substance abuse    Endo/Other  neg diabetesHypothyroidism  Class 3 obesityPre-diabetes.   Renal/GU negative Renal ROS     Musculoskeletal   Abdominal  (+) + obese  Peds  Hematology   Anesthesia Other Findings Past Medical History: No date: Allergy No date: Anxiety No date: Arthritis     Comment:  PSORIATIC No date: Borderline diabetes No date: Complication of anesthesia No date: Depression 07/27/2019: Dysplastic nevus     Comment:  Right lateral bicep. Severe atypia, close to margin.               Excised 11/14/2019, margins free. 07/30/2021: Dysplastic nevus     Comment:  right mid back paraspinal, mod atypia 07/30/2022: Dysplastic nevus     Comment:  Right Mid Buttocks, Severe atypia, Excised 09/08/22 No date: GERD (gastroesophageal reflux disease)     Comment:  RARE-NO MEDS 2004: History of kidney stones No date: History of tobacco use 07/16/2016: Hx of dysplastic nevus     Comment:  Right superior medial buttocks just lateral to superior               crease. Mild  atypia clost to deep margin 07/12/2017: Hx of dysplastic nevus     Comment:  Left upper back sup. scapula. Severe atypia with focal               fibrosis, close to lateral margin. Excised: 09/28/2017.               Margins free 07/12/2017: Hx of dysplastic nevus     Comment:  Left LQA periumbilical. Moderate atypia, irritated,               close to lateral margin 07/27/2019, exc 11/14/19: Hx of dysplastic nevus     Comment:  Right lateral bicep. Severe atypia, close to margin No date: Hypothyroidism No date: Morbidly obese (HCC) No date: PONV (postoperative nausea and vomiting) No date: Psoriasis     Comment:  Cosentyx No date: Tachycardia  Reproductive/Obstetrics                             Anesthesia Physical Anesthesia Plan  ASA: 3  Anesthesia Plan: General   Post-op Pain Management: Regional block* and Tylenol PO (pre-op)*   Induction: Intravenous  PONV Risk Score and Plan: 3 and Ondansetron, Dexamethasone and Midazolam  Airway Management Planned: Oral ETT and Video Laryngoscope Planned  Additional Equipment: None  Intra-op Plan:   Post-operative Plan:   Informed Consent:      Dental advisory given  Plan Discussed with: CRNA and  Surgeon  Anesthesia Plan Comments: ( )        Anesthesia Quick Evaluation

## 2023-07-13 ENCOUNTER — Encounter
Admission: RE | Disposition: A | Discharge status: Home / Self Care | Payer: Self-pay | Source: Home / Self Care | Attending: Orthopedic Surgery

## 2023-07-13 ENCOUNTER — Ambulatory Visit
Admission: RE | Admit: 2023-07-13 | Discharge: 2023-07-13 | Disposition: A | Discharge status: Home / Self Care | Payer: BC Managed Care – PPO | Attending: Orthopedic Surgery | Admitting: Orthopedic Surgery

## 2023-07-13 ENCOUNTER — Encounter: Payer: Self-pay | Admitting: Orthopedic Surgery

## 2023-07-13 ENCOUNTER — Other Ambulatory Visit: Payer: Self-pay

## 2023-07-13 ENCOUNTER — Ambulatory Visit: Payer: BC Managed Care – PPO

## 2023-07-13 ENCOUNTER — Ambulatory Visit: Payer: Self-pay | Admitting: Anesthesiology

## 2023-07-13 ENCOUNTER — Ambulatory Visit: Payer: BC Managed Care – PPO | Admitting: Anesthesiology

## 2023-07-13 DIAGNOSIS — F419 Anxiety disorder, unspecified: Secondary | ICD-10-CM | POA: Diagnosis not present

## 2023-07-13 DIAGNOSIS — M75101 Unspecified rotator cuff tear or rupture of right shoulder, not specified as traumatic: Secondary | ICD-10-CM | POA: Insufficient documentation

## 2023-07-13 DIAGNOSIS — M25871 Other specified joint disorders, right ankle and foot: Secondary | ICD-10-CM | POA: Insufficient documentation

## 2023-07-13 DIAGNOSIS — M89511 Osteolysis, right shoulder: Secondary | ICD-10-CM | POA: Diagnosis not present

## 2023-07-13 DIAGNOSIS — Z87891 Personal history of nicotine dependence: Secondary | ICD-10-CM | POA: Diagnosis not present

## 2023-07-13 DIAGNOSIS — K219 Gastro-esophageal reflux disease without esophagitis: Secondary | ICD-10-CM | POA: Insufficient documentation

## 2023-07-13 DIAGNOSIS — F32A Depression, unspecified: Secondary | ICD-10-CM | POA: Diagnosis not present

## 2023-07-13 DIAGNOSIS — Z01812 Encounter for preprocedural laboratory examination: Secondary | ICD-10-CM

## 2023-07-13 DIAGNOSIS — Z6841 Body Mass Index (BMI) 40.0 and over, adult: Secondary | ICD-10-CM | POA: Diagnosis not present

## 2023-07-13 DIAGNOSIS — E039 Hypothyroidism, unspecified: Secondary | ICD-10-CM | POA: Insufficient documentation

## 2023-07-13 DIAGNOSIS — M7581 Other shoulder lesions, right shoulder: Secondary | ICD-10-CM | POA: Diagnosis not present

## 2023-07-13 LAB — POCT PREGNANCY, URINE: Preg Test, Ur: NEGATIVE

## 2023-07-13 SURGERY — SHOULDER ARTHROSCOPY WITH SUBACROMIAL DECOMPRESSION AND DISTAL CLAVICLE EXCISION
Anesthesia: General | Site: Shoulder | Laterality: Right

## 2023-07-13 MED ORDER — ACETAMINOPHEN 500 MG PO TABS: 1000.0000 mg | ORAL_TABLET | Freq: Three times a day (TID) | ORAL | 2 refills | Status: DC

## 2023-07-13 MED ORDER — BUPIVACAINE HCL (PF) 0.5 % IJ SOLN
INTRAMUSCULAR | Status: AC
  Filled 2023-07-13: qty 10

## 2023-07-13 MED ORDER — SUCCINYLCHOLINE CHLORIDE 200 MG/10ML IV SOSY
PREFILLED_SYRINGE | INTRAVENOUS | Status: DC | PRN
Start: 2023-07-13 — End: 2023-07-13
  Administered 2023-07-13: 100 mg via INTRAVENOUS

## 2023-07-13 MED ORDER — ASPIRIN 325 MG PO TBEC
325.0000 mg | DELAYED_RELEASE_TABLET | Freq: Every day | ORAL | 0 refills | Status: DC
  Filled 2023-07-13: qty 14, 14d supply, fill #0

## 2023-07-13 MED ORDER — OXYCODONE HCL 5 MG/5ML PO SOLN: 5.0000 mg | Freq: Once | ORAL | Status: DC | PRN

## 2023-07-13 MED ORDER — ACETAMINOPHEN 10 MG/ML IV SOLN: 1000.0000 mg | Freq: Once | INTRAVENOUS | Status: DC | PRN

## 2023-07-13 MED ORDER — ROCURONIUM BROMIDE 100 MG/10ML IV SOLN
INTRAVENOUS | Status: DC | PRN
Start: 2023-07-13 — End: 2023-07-13
  Administered 2023-07-13: 50 mg via INTRAVENOUS

## 2023-07-13 MED ORDER — EPINEPHRINE PF 1 MG/ML IJ SOLN
INTRAMUSCULAR | Status: AC
  Filled 2023-07-13: qty 4

## 2023-07-13 MED ORDER — BUPIVACAINE HCL (PF) 0.5 % IJ SOLN
INTRAMUSCULAR | Status: DC | PRN
  Administered 2023-07-13: 10 mL via PERINEURAL

## 2023-07-13 MED ORDER — OXYCODONE HCL 5 MG PO TABS: 5.0000 mg | ORAL_TABLET | Freq: Once | ORAL | Status: DC | PRN

## 2023-07-13 MED ORDER — LIDOCAINE HCL (CARDIAC) PF 100 MG/5ML IV SOSY
PREFILLED_SYRINGE | INTRAVENOUS | Status: DC | PRN
  Administered 2023-07-13: 100 mg via INTRAVENOUS

## 2023-07-13 MED ORDER — BUPIVACAINE LIPOSOME 1.3 % IJ SUSP
INTRAMUSCULAR | Status: DC | PRN
  Administered 2023-07-13: 20 mL via PERINEURAL

## 2023-07-13 MED ORDER — CEFAZOLIN SODIUM-DEXTROSE 1-4 GM/50ML-% IV SOLN: 1.0000 g | Freq: Once | INTRAVENOUS | Status: DC

## 2023-07-13 MED ORDER — OXYCODONE HCL 5 MG PO TABS: 5.0000 mg | ORAL_TABLET | ORAL | 0 refills | Status: DC | PRN

## 2023-07-13 MED ORDER — GLYCOPYRROLATE 0.2 MG/ML IJ SOLN
INTRAMUSCULAR | Status: DC | PRN
  Administered 2023-07-13: .2 mg via INTRAVENOUS

## 2023-07-13 MED ORDER — ACETAMINOPHEN 500 MG PO TABS
ORAL_TABLET | ORAL | Status: AC
  Filled 2023-07-13: qty 2

## 2023-07-13 MED ORDER — KETAMINE HCL 50 MG/5ML IJ SOSY
PREFILLED_SYRINGE | INTRAMUSCULAR | Status: DC | PRN
  Administered 2023-07-13: 30 mg via INTRAVENOUS
  Administered 2023-07-13: 20 mg via INTRAVENOUS

## 2023-07-13 MED ORDER — SUGAMMADEX SODIUM 200 MG/2ML IV SOLN
INTRAVENOUS | Status: DC | PRN
  Administered 2023-07-13: 200 mg via INTRAVENOUS

## 2023-07-13 MED ORDER — CEFAZOLIN SODIUM-DEXTROSE 2-4 GM/100ML-% IV SOLN
2.0000 g | Freq: Once | INTRAVENOUS | Status: AC
  Administered 2023-07-13: 3 g via INTRAVENOUS

## 2023-07-13 MED ORDER — DEXAMETHASONE SODIUM PHOSPHATE 10 MG/ML IJ SOLN
INTRAMUSCULAR | Status: DC | PRN
  Administered 2023-07-13: 10 mg via INTRAVENOUS

## 2023-07-13 MED ORDER — BUPIVACAINE LIPOSOME 1.3 % IJ SUSP
INTRAMUSCULAR | Status: AC
  Filled 2023-07-13: qty 20

## 2023-07-13 MED ORDER — CEFAZOLIN SODIUM-DEXTROSE 2-4 GM/100ML-% IV SOLN: 2.0000 g | Freq: Once | INTRAVENOUS | Status: DC

## 2023-07-13 MED ORDER — ONDANSETRON 4 MG PO TBDP
4.0000 mg | ORAL_TABLET | Freq: Three times a day (TID) | ORAL | 0 refills | Status: DC | PRN
  Filled 2023-07-13: qty 20, 7d supply, fill #0

## 2023-07-13 MED ORDER — MIDAZOLAM HCL 2 MG/2ML IJ SOLN
INTRAMUSCULAR | Status: DC | PRN
  Administered 2023-07-13: 2 mg via INTRAVENOUS

## 2023-07-13 MED ORDER — FENTANYL CITRATE (PF) 100 MCG/2ML IJ SOLN: 25.0000 ug | INTRAMUSCULAR | Status: DC | PRN

## 2023-07-13 MED ORDER — DEXMEDETOMIDINE HCL IN NACL 80 MCG/20ML IV SOLN
INTRAVENOUS | Status: DC | PRN
  Administered 2023-07-13: 12 ug via INTRAVENOUS
  Administered 2023-07-13: 8 ug via INTRAVENOUS

## 2023-07-13 MED ORDER — DROPERIDOL 2.5 MG/ML IJ SOLN
0.6250 mg | Freq: Once | INTRAMUSCULAR | Status: AC | PRN
  Administered 2023-07-13: 0.625 mg via INTRAVENOUS

## 2023-07-13 MED ORDER — CHLORHEXIDINE GLUCONATE 0.12 % MT SOLN
OROMUCOSAL | Status: AC
  Filled 2023-07-13: qty 15

## 2023-07-13 MED ORDER — FENTANYL CITRATE (PF) 100 MCG/2ML IJ SOLN
INTRAMUSCULAR | Status: DC | PRN
  Administered 2023-07-13 (×3): 50 ug via INTRAVENOUS

## 2023-07-13 MED ORDER — OXYCODONE HCL 5 MG PO TABS
5.0000 mg | ORAL_TABLET | ORAL | 0 refills | Status: DC | PRN
  Filled 2023-07-13: qty 30, 3d supply, fill #0

## 2023-07-13 MED ORDER — ONDANSETRON HCL 4 MG/2ML IJ SOLN
INTRAMUSCULAR | Status: DC | PRN
  Administered 2023-07-13: 4 mg via INTRAVENOUS

## 2023-07-13 MED ORDER — FENTANYL CITRATE PF 50 MCG/ML IJ SOSY
PREFILLED_SYRINGE | INTRAMUSCULAR | Status: AC
  Filled 2023-07-13: qty 1

## 2023-07-13 MED ORDER — ONDANSETRON 4 MG PO TBDP: 4.0000 mg | ORAL_TABLET | Freq: Three times a day (TID) | ORAL | 0 refills | Status: DC | PRN

## 2023-07-13 MED ORDER — LACTATED RINGERS IR SOLN
Status: DC | PRN
  Administered 2023-07-13 (×4): 3000 mL

## 2023-07-13 MED ORDER — PROPOFOL 500 MG/50ML IV EMUL
INTRAVENOUS | Status: DC | PRN
  Administered 2023-07-13: 150 ug/kg/min via INTRAVENOUS

## 2023-07-13 MED ORDER — KETAMINE HCL 50 MG/5ML IJ SOSY
PREFILLED_SYRINGE | INTRAMUSCULAR | Status: AC
  Filled 2023-07-13: qty 5

## 2023-07-13 MED ORDER — DROPERIDOL 2.5 MG/ML IJ SOLN
INTRAMUSCULAR | Status: AC
  Filled 2023-07-13: qty 2

## 2023-07-13 MED ORDER — FENTANYL CITRATE (PF) 100 MCG/2ML IJ SOLN
INTRAMUSCULAR | Status: AC
  Filled 2023-07-13: qty 2

## 2023-07-13 MED ORDER — PROPOFOL 10 MG/ML IV BOLUS
INTRAVENOUS | Status: DC | PRN
  Administered 2023-07-13: 200 mg via INTRAVENOUS

## 2023-07-13 MED ORDER — ACETAMINOPHEN EXTRA STRENGTH 500 MG PO TABS
1000.0000 mg | ORAL_TABLET | Freq: Three times a day (TID) | ORAL | 2 refills | Status: DC
  Filled 2023-07-13: qty 100, 17d supply, fill #0

## 2023-07-13 MED ORDER — MIDAZOLAM HCL 2 MG/2ML IJ SOLN
INTRAMUSCULAR | Status: AC
Start: 2023-07-13 — End: ?
  Filled 2023-07-13: qty 2

## 2023-07-13 MED ORDER — LACTATED RINGERS IV SOLN: INTRAVENOUS | Status: DC | PRN

## 2023-07-13 MED ORDER — ASPIRIN 325 MG PO TBEC: 325.0000 mg | DELAYED_RELEASE_TABLET | Freq: Every day | ORAL | 0 refills | Status: AC

## 2023-07-13 SURGICAL SUPPLY — 69 items
ADAPTER IRRIG TUBE 2 SPIKE SOL (ADAPTER) ×2 IMPLANT
ANCHOR 3.9 PEEK CORKSCREW 5MTS (Anchor) IMPLANT
BLADE SHAVER 4.5X7 STR FR (MISCELLANEOUS) ×1 IMPLANT
BRUSH SCRUB EZ 4% CHG (MISCELLANEOUS) ×1 IMPLANT
BUR BR 5.5 WIDE MOUTH (BURR) IMPLANT
CANNULA PART THRD DISP 5.75X7 (CANNULA) IMPLANT
CANNULA PARTIAL THREAD 2X7 (CANNULA) ×1 IMPLANT
CANNULA TWIST IN 8.25X9CM (CANNULA) IMPLANT
CHLORAPREP W/TINT 26 (MISCELLANEOUS) ×1 IMPLANT
COOLER POLAR GLACIER W/PUMP (MISCELLANEOUS) ×1 IMPLANT
DERMABOND ADVANCED .7 DNX12 (GAUZE/BANDAGES/DRESSINGS) IMPLANT
DEVICE SUCT BLK HOLE OR FLOOR (MISCELLANEOUS) ×2 IMPLANT
DRAPE 3/4 80X56 (DRAPES) ×1 IMPLANT
DRAPE INCISE IOBAN 66X45 STRL (DRAPES) ×1 IMPLANT
DRAPE STERI 35X30 U-POUCH (DRAPES) ×1 IMPLANT
DRAPE U-SHAPE 47X51 STRL (DRAPES) ×2 IMPLANT
DRSG TEGADERM 4X4.75 (GAUZE/BANDAGES/DRESSINGS) ×2 IMPLANT
ELECT REM PT RETURN 9FT ADLT (ELECTROSURGICAL)
ELECTRODE REM PT RTRN 9FT ADLT (ELECTROSURGICAL) IMPLANT
GAUZE SPONGE 4X4 12PLY STRL (GAUZE/BANDAGES/DRESSINGS) ×1 IMPLANT
GAUZE XEROFORM 1X8 LF (GAUZE/BANDAGES/DRESSINGS) ×1 IMPLANT
GLOVE BIO SURGEON STRL SZ7.5 (GLOVE) ×1 IMPLANT
GLOVE BIOGEL PI IND STRL 8 (GLOVE) ×2 IMPLANT
GLOVE SURG ORTHO 8.0 STRL STRW (GLOVE) ×1 IMPLANT
GLOVE SURG SYN 7.5 E (GLOVE) ×1 IMPLANT
GLOVE SURG SYN 7.5 PF PI (GLOVE) ×1 IMPLANT
GOWN STRL REUS W/ TWL LRG LVL3 (GOWN DISPOSABLE) ×2 IMPLANT
GOWN STRL REUS W/ TWL XL LVL3 (GOWN DISPOSABLE) ×1 IMPLANT
GOWN STRL REUS W/TWL LRG LVL4 (GOWN DISPOSABLE) ×1 IMPLANT
IV LR IRRIG 3000ML ARTHROMATIC (IV SOLUTION) ×4 IMPLANT
KIT CORKSCREW KNTLS 3.9 S/T/P (INSTRUMENTS) IMPLANT
KIT STABILIZATION SHOULDER (MISCELLANEOUS) ×1 IMPLANT
KIT SUTURETAK 3.0 INSERT PERC (KITS) ×1 IMPLANT
KIT TURNOVER KIT A (KITS) ×1 IMPLANT
MANIFOLD NEPTUNE II (INSTRUMENTS) ×2 IMPLANT
MASK FACE SPIDER DISP (MASK) ×1 IMPLANT
MAT ABSORB FLUID 56X50 GRAY (MISCELLANEOUS) ×2 IMPLANT
NDL HYPO 22X1.5 SAFETY MO (MISCELLANEOUS) ×1 IMPLANT
NDL SAFETY ECLIPSE 18X1.5 (NEEDLE) ×1 IMPLANT
NEEDLE HYPO 22X1.5 SAFETY MO (MISCELLANEOUS) ×1 IMPLANT
NS IRRIG 500ML POUR BTL (IV SOLUTION) ×1 IMPLANT
PACK ARTHROSCOPY SHOULDER (MISCELLANEOUS) ×1 IMPLANT
PAD ABD DERMACEA PRESS 5X9 (GAUZE/BANDAGES/DRESSINGS) ×1 IMPLANT
PAD ARMBOARD 7.5X6 YLW CONV (MISCELLANEOUS) ×1 IMPLANT
PAD WRAPON POLAR SHDR XLG (MISCELLANEOUS) ×1 IMPLANT
PASSER SUT FIRSTPASS SELF (INSTRUMENTS) ×1 IMPLANT
SLEEVE REMOTE CONTROL 5X12 (DRAPES) IMPLANT
SLING ULTRA II M (MISCELLANEOUS) ×1 IMPLANT
SPONGE T-LAP 18X18 ~~LOC~~+RFID (SPONGE) ×1 IMPLANT
STRAP SAFETY 5IN WIDE (MISCELLANEOUS) ×1 IMPLANT
SUT ETHILON 3-0 FS-10 30 BLK (SUTURE) ×1
SUT LASSO 90 DEG SD STR (SUTURE) ×1 IMPLANT
SUT MNCRL 4-0 27XMFL (SUTURE)
SUT PDS AB 0 CT1 27 (SUTURE) IMPLANT
SUT VIC AB 0 CT1 36 (SUTURE) ×1 IMPLANT
SUT VIC AB 2-0 CT2 27 (SUTURE) ×1 IMPLANT
SUTURE EHLN 3-0 FS-10 30 BLK (SUTURE) ×1 IMPLANT
SUTURE MNCRL 4-0 27XMF (SUTURE) ×1 IMPLANT
SYR 10ML LL (SYRINGE) ×1 IMPLANT
SYSTEM IMPL TENODESIS LNT 2.9 (Orthopedic Implant) IMPLANT
TAPE CLOTH 3X10 WHT NS LF (GAUZE/BANDAGES/DRESSINGS) ×1 IMPLANT
TRAP FLUID SMOKE EVACUATOR (MISCELLANEOUS) ×1 IMPLANT
TUBE SET DOUBLEFLO INFLOW (TUBING) ×1 IMPLANT
TUBE SET DOUBLEFLO OUTFLOW (TUBING) ×1 IMPLANT
TUBING CONNECTING 10 (TUBING) ×1 IMPLANT
WAND WEREWOLF FLOW 90D (MISCELLANEOUS) ×1 IMPLANT
WATER STERILE IRR 500ML POUR (IV SOLUTION) ×1 IMPLANT
WRAP SHOULDER HOT/COLD PACK (SOFTGOODS) ×1 IMPLANT
WRAPON POLAR PAD SHDR XLG (MISCELLANEOUS) ×1

## 2023-07-13 NOTE — Anesthesia Procedure Notes (Signed)
Anesthesia Regional Block: Interscalene brachial plexus block   Pre-Anesthetic Checklist: , timeout performed,  Correct Patient, Correct Site, Correct Laterality,  Correct Procedure, Correct Position, site marked,  Risks and benefits discussed,  Surgical consent,  Pre-op evaluation,  At surgeon's request and post-op pain management  Laterality: Upper and Right  Prep: chloraprep       Needles:  Injection technique: Single-shot  Needle Type: Stimiplex     Needle Length: 9cm  Needle Gauge: 22     Additional Needles:   Procedures:,,,, ultrasound used (permanent image in chart),,    Narrative:  Start time: 07/13/2023 10:36 AM End time: 07/13/2023 10:39 AM Injection made incrementally with aspirations every 5 mL.  Performed by: Personally  Anesthesiologist: Foye Deer, MD  Additional Notes: Patient consented for risk and benefits of nerve block including but not limited to nerve damage, failed block, bleeding and infection.  Patient voiced understanding.  Functioning IV was confirmed and monitors were applied.  Timeout done prior to procedure and prior to any sedation being given to the patient.  Patient confirmed procedure site prior to any sedation given to the patient. Sterile prep,hand hygiene and sterile gloves were used.  Minimal sedation used for procedure.  No paresthesia endorsed by patient during the procedure.  Negative aspiration and negative test dose prior to incremental administration of local anesthetic. The patient tolerated the procedure well with no immediate complications.

## 2023-07-13 NOTE — Anesthesia Postprocedure Evaluation (Signed)
Anesthesia Post Note  Patient: Kelsey Randolph  Procedure(s) Performed: Right shoulder arthroscopic subacromial decompression, distal clavicle excision and biceps tenodesis (Right: Shoulder)  Patient location during evaluation: PACU Anesthesia Type: General Level of consciousness: awake and alert Pain management: pain level controlled Vital Signs Assessment: post-procedure vital signs reviewed and stable Respiratory status: spontaneous breathing, nonlabored ventilation and respiratory function stable Cardiovascular status: blood pressure returned to baseline and stable Postop Assessment: no apparent nausea or vomiting Anesthetic complications: no   No notable events documented.   Last Vitals:  Vitals:   07/13/23 1315 07/13/23 1336  BP: 115/83 132/82  Pulse: 83 82  Resp: 14 14  Temp: 36.5 C 36.5 C  SpO2: 95% 99%    Last Pain:  Vitals:   07/13/23 1336  TempSrc: Temporal  PainSc: 0-No pain                 Foye Deer

## 2023-07-13 NOTE — H&P (Signed)
Paper H&P to be scanned into permanent record. H&P reviewed. No significant changes noted.  

## 2023-07-13 NOTE — Anesthesia Procedure Notes (Signed)
Procedure Name: Intubation Date/Time: 07/13/2023 10:58 AM  Performed by: Cheral Bay, CRNAPre-anesthesia Checklist: Patient identified, Emergency Drugs available, Suction available and Patient being monitored Patient Re-evaluated:Patient Re-evaluated prior to induction Oxygen Delivery Method: Circle system utilized Preoxygenation: Pre-oxygenation with 100% oxygen Induction Type: IV induction, Rapid sequence and Cricoid Pressure applied Laryngoscope Size: McGrath and 3 Grade View: Grade I Tube type: Oral Tube size: 7.0 mm Number of attempts: 1 Airway Equipment and Method: Stylet Placement Confirmation: ETT inserted through vocal cords under direct vision, positive ETCO2 and breath sounds checked- equal and bilateral Secured at: 21 cm Tube secured with: Tape Dental Injury: Teeth and Oropharynx as per pre-operative assessment

## 2023-07-13 NOTE — Discharge Instructions (Addendum)
Post-Op Instructions - Rotator Cuff Repair  1. Bracing: You will wear a shoulder immobilizer or sling for 4 weeks.   2. Driving: No driving for 4 weeks post-op.  3. Activity: No active lifting for 2 months. Wrist, hand, and elbow motion only. Avoid lifting the upper arm away from the body except for hygiene. You are permitted to bend and straighten the elbow passively only (no active elbow motion). You may use your hand and wrist for typing, writing, and managing utensils (cutting food). Do not lift more than a coffee cup for 8 weeks.  When sleeping or resting, inclined positions (recliner chair or wedge pillow) and a pillow under the forearm for support may provide better comfort for up to 4 weeks.  Avoid long distance travel for 4 weeks.  Return to normal activities after rotator cuff repair repair normally takes 6 months on average. If rehab goes very well, may be able to do most activities at 4 months, except overhead or contact sports.  4. Physical Therapy: Begins 3-4 days after surgery, and proceed 1 time per week for the first 6 weeks, then 1-2 times per week from weeks 6-20 post-op.  5. Medications:  - You will be provided a prescription for narcotic pain medicine. After surgery, take 1-2 narcotic tablets every 4 hours if needed for severe pain.  - A prescription for anti-nausea medication will be provided in case the narcotic medicine causes nausea - take 1 tablet every 6 hours only if nauseated.   - Take tylenol 1000 mg (2 Extra Strength tablets or 3 regular strength) every 8 hours for pain.  May decrease or stop tylenol 5 days after surgery if you are having minimal pain. - Take ASA 325mg /day x 2 weeks to help prevent DVTs/PEs (blood clots).  - DO NOT take ANY nonsteroidal anti-inflammatory pain medications (Advil, Motrin, Ibuprofen, Aleve, Naproxen, or Naprosyn). These medicines can inhibit healing of your shoulder repair.    If you are taking prescription medication for anxiety,  depression, insomnia, muscle spasm, chronic pain, or for attention deficit disorder, you are advised that you are at a higher risk of adverse effects with use of narcotics post-op, including narcotic addiction/dependence, depressed breathing, death. If you use non-prescribed substances: alcohol, marijuana, cocaine, heroin, methamphetamines, etc., you are at a higher risk of adverse effects with use of narcotics post-op, including narcotic addiction/dependence, depressed breathing, death. You are advised that taking > 50 morphine milligram equivalents (MME) of narcotic pain medication per day results in twice the risk of overdose or death. For your prescription provided: oxycodone 5 mg - taking more than 6 tablets per day would result in > 50 morphine milligram equivalents (MME) of narcotic pain medication. Be advised that we will prescribe narcotics short-term, for acute post-operative pain only - 3 weeks for major operations such as shoulder repair/reconstruction surgeries.     6. Post-Op Appointment:  Your first post-op appointment will be 10-14 days post-op.  7. Work or School: For most, but not all procedures, we advise staying out of work or school for at least 1 to 2 weeks in order to recover from the stress of surgery and to allow time for healing.   If you need a work or school note this can be provided.   8. Smoking: If you are a smoker, you need to refrain from smoking in the postoperative period. The nicotine in cigarettes will inhibit healing of your shoulder repair and decrease the chance of successful repair. Similarly, nicotine containing products (  gum, patches) should be avoided.   Post-operative Brace: Apply and remove the brace you received as you were instructed to at the time of fitting and as described in detail as the brace's instructions for use indicate.  Wear the brace for the period of time prescribed by your physician.  The brace can be cleaned with soap and water and  allowed to air dry only.  Should the brace result in increased pain, decreased feeling (numbness/tingling), increased swelling or an overall worsening of your medical condition, please contact your doctor immediately.  If an emergency situation occurs as a result of wearing the brace after normal business hours, please dial 911 and seek immediate medical attention.  Let your doctor know if you have any further questions about the brace issued to you. Refer to the shoulder sling instructions for use if you have any questions regarding the correct fit of your shoulder sling.  [facility] Customer [facility] for Troubleshooting: [phone]  Video that illustrates how to properly use a shoulder sling: "Instructions for Proper Use of an Orthopaedic Sling" http://bass.com/  Information for Discharge Teaching:  DO NOT TAKE OFF UNTIL 07/17/2023 EXPAREL (bupivacaine liposome injectable suspension)   Pain relief is important to your recovery. The goal is to control your pain so you can move easier and return to your normal activities as soon as possible after your procedure. Your physician may use several types of medicines to manage pain, swelling, and more.  Your surgeon or anesthesiologist gave you EXPAREL(bupivacaine) to help control your pain after surgery.  EXPAREL is a local anesthetic designed to release slowly over an extended period of time to provide pain relief by numbing the tissue around the surgical site. EXPAREL is designed to release pain medication over time and can control pain for up to 72 hours. Depending on how you respond to EXPAREL, you may require less pain medication during your recovery. EXPAREL can help reduce or eliminate the need for opioids during the first few days after surgery when pain relief is needed the most. EXPAREL is not an opioid and is not addictive. It does not cause sleepiness or sedation.   Important! A teal colored band has been placed on your  arm with the date, time and amount of EXPAREL you have received. Please leave this armband in place for the full 96 hours following administration, and then you may remove the band. If you return to the hospital for any reason within 96 hours following the administration of EXPAREL, the armband provides important information that your health care providers to know, and alerts them that you have received this anesthetic.    Possible side effects of EXPAREL: Temporary loss of sensation or ability to move in the area where medication was injected. Nausea, vomiting, constipation Rarely, numbness and tingling in your mouth or lips, lightheadedness, or anxiety may occur. Call your doctor right away if you think you may be experiencing any of these sensations, or if you have other questions regarding possible side effects.  Follow all other discharge instructions given to you by your surgeon or nurse. Eat a healthy diet and drink plenty of water or other fluids.

## 2023-07-13 NOTE — Transfer of Care (Signed)
Immediate Anesthesia Transfer of Care Note  Patient: Kelsey Randolph  Procedure(s) Performed: Right shoulder arthroscopic subacromial decompression, distal clavicle excision and biceps tenodesis (Right: Shoulder)  Patient Location: PACU  Anesthesia Type:General  Level of Consciousness: awake  Airway & Oxygen Therapy: Patient Spontanous Breathing  Post-op Assessment: Report given to RN and Post -op Vital signs reviewed and stable  Post vital signs: Reviewed and stable  Last Vitals:  Vitals Value Taken Time  BP 112/71 07/13/23 1245  Temp    Pulse 81 07/13/23 1246  Resp 21 07/13/23 1246  SpO2 99 % 07/13/23 1246  Vitals shown include unfiled device data.  Last Pain:  Vitals:   07/13/23 0959  PainSc: 0-No pain         Complications: No notable events documented.

## 2023-07-13 NOTE — Op Note (Signed)
SURGERY DATE: 07/13/2023   PRE-OP DIAGNOSIS:  1. Right subacromial impingement 2. Right biceps tendinopathy 3. Right distal clavicle osteolysis  POST-OP DIAGNOSIS: 1. Right subacromial impingement 2. Right biceps tendinopathy 3. Right rotator cuff tear 4. Right distal clavicle osteolysis  PROCEDURES:  1. Right arthroscopic rotator cuff repair (upper border subscapularis) 2. Right arthroscopic biceps tenodesis 3. Right arthroscopic subacromial decompression 4. Right arthroscopic extensive debridement of shoulder (glenohumeral and subacromial spaces) 5. Right arthroscopic distal clavicle excision   SURGEON: Rosealee Albee, MD   ASSISTANT: Sonny Dandy, PA   ANESTHESIA: Gen with Exparel interscalene block   ESTIMATED BLOOD LOSS: 5cc   DRAINS:  none   TOTAL IV FLUIDS: per anesthesia      SPECIMENS: none   IMPLANTS:  - Arthrex 2.39mm PushLock x 1 - Arthrex 3.66mm Knotless Corkscrew x 1     OPERATIVE FINDINGS:  Examination under anesthesia: A careful examination under anesthesia was performed.  Passive range of motion was: FF: 150; ER at side: 45; ER in abduction: 90; IR in abduction: 45.  Anterior load shift: NT.  Posterior load shift: NT.  Sulcus in neutral: NT.  Sulcus in ER: NT.     Intra-operative findings: A thorough arthroscopic examination of the shoulder was performed.  The findings are: 1. Biceps tendon: tendinopathy with erythema  2. Superior labrum: erythema 3. Posterior labrum and capsule: normal 4. Inferior capsule and inferior recess: normal 5. Glenoid cartilage surface: Normal 6. Supraspinatus attachment: Partial-thickness articular sided fraying affecting approximately 5% of the anterior supraspinatus footprint.  Intact bursal surface. 7. Posterior rotator cuff attachment: normal 8. Humeral head articular cartilage: normal 9. Rotator interval: significant synovitis 10: Subscapularis tendon: Partial-thickness upper border articular sided tear 11. Anterior  labrum: Mildly degenerative 12. IGHL: normal   OPERATIVE REPORT:    Indications for procedure:  LASHAY MCROY is a 47 y.o. female with over 9 months of right shoulder pain.  Clinical exam and MRI were concerning for pain primarily at the [facility] joint consistent with distal clavicle osteolysis and bone marrow edema of the distal clavicle, biceps tendinopathy, subacromial impingement.  Of note, she had similar findings on the contralateral left side and underwent surgery in March 2024 with an excellent result. After discussion of risks, benefits, and alternatives to surgery, the patient elected to proceed.    Procedure in detail:   I identified Florance Partsch Thai in the pre-operative holding area.  I marked the operative shoulder with my initials. I reviewed the risks and benefits of the proposed surgical intervention, and the patient wished to proceed.  Anesthesia was then performed with an Exparel interscalene block.  The patient was transferred to the operative suite and placed in the beach chair position.     Appropriate IV antibiotics were administered prior to incision. The operative upper extremity was then prepped and draped in standard fashion. A time out was performed confirming the correct extremity, correct patient, and correct procedure.    I then created a standard posterior portal with an 11 blade. The glenohumeral joint was easily entered with a blunt trocar and the arthroscope introduced. The findings of diagnostic arthroscopy are described above. I debrided degenerative tissue including the synovitic tissue about the rotator interval and anterior and superior labrum. I then coagulated the inflamed synovium to obtain hemostasis and reduce the risk of post-operative swelling using an Arthrocare radiofrequency device.  The articular sided fraying of the supraspinatus was also debrided with an oscillating shaver.   I then turned  my attention to the arthroscopic biceps tenodesis. The Loop n Tack  technique was used to pass a FiberTape through the biceps in a locked fashion adjacent to the biceps anchor.  A hole for a 2.9 mm Arthrex PushLock was drilled in the bicipital groove just superior to the subscapularis tendon insertion.  The biceps tendon was then cut and the biceps anchor complex was debrided down to a stable base on the superior labrum.  The FiberTape was loaded onto the PushLock anchor and impacted into place into the previously drilled hole in the bicipital groove.  This appropriately secured the biceps into the bicipital groove and took it off of tension.  Next, arthroscopic repair of the subscapularis was performed. The lesser tuberosity footprint was prepared with a combination of electrocautery and an arthroscopic curette.  An Arthrex knotless corkscrew was placed into the lesser tuberosity footprint from the anterior portal.  A BirdBeak was used to shuttle the repair suture through the upper border of the subscapularis tendon.  The suture was then shuttled through the anchor. With the arm in neutral rotation, the repair was tensioned appropriately. This appropriately reduced the subscapularis tear.  The arm was then internally and externally rotated and the subscapularis was noted to move appropriately with rotation.  The remainder of the suture was then cut.   Next, the arthroscope was then introduced into the subacromial space. A direct lateral portal was created with an 11-blade after spinal needle localization. An extensive subacromial bursectomy and debridement was performed using a combination of the shaver and Arthrocare wand.  The superior rotator cuff was noted to be intact after complete bursectomy.  The entire acromial undersurface was exposed and the CA ligament was subperiosteally elevated to expose the anterior acromial hook. A burr was used to create a flat anterior and lateral aspect of the acromion, converting it from a Type 2 to a Type 1 acromion. Care was made to keep  the deltoid fascia intact.  I then turned my attention to the arthroscopic distal clavicle excision. I identified the acromioclavicular joint. Surrounding bursal tissue was debrided and the edges of the joint were identified. I used the 5.64mm barrel burr to remove the distal clavicle parallel to the edge of the acromion. I was able to fit two widths of the burr into the space between the distal clavicle and acromion, signifying that I had removed ~28mm of distal clavicle. This was confirmed by viewing anteriorly and introducing a probe with measuring marks from the lateral portal. Hemostasis was achieved with an Arthrocare wand.    Fluid was evacuated from the shoulder, and the portals were closed with 3-0 Nylon. Xeroform was applied to the portals. A sterile dressing was applied, followed by a Polar Care sleeve and a SlingShot shoulder immobilizer/sling. The patient was awakened from anesthesia without difficulty and was transferred to the PACU in stable condition.    Of note, assistance from a PA was essential to performing the surgery.  PA was present for the entire surgery.  PA assisted with patient positioning, retraction, instrumentation, and wound closure. The surgery would have been more difficult and had longer operative time without PA assistance.   COMPLICATIONS: none   DISPOSITION: plan for discharge home after recovery in PACU     POSTOPERATIVE PLAN: Remain in sling (except hygiene and elbow/wrist/hand RoM exercises as instructed by PT) x 4 weeks and NWB for this time. PT to begin 3-4 days after surgery.  Small/medium rotator cuff repair rehab protocol. ASA 325mg   daily x 2 weeks for DVT ppx.

## 2023-07-14 ENCOUNTER — Encounter: Payer: Self-pay | Admitting: Orthopedic Surgery

## 2023-07-15 ENCOUNTER — Other Ambulatory Visit: Payer: Self-pay | Admitting: Orthopedic Surgery

## 2023-07-15 ENCOUNTER — Ambulatory Visit
Admission: RE | Admit: 2023-07-15 | Discharge: 2023-07-15 | Disposition: A | Discharge status: Home / Self Care | Payer: BC Managed Care – PPO | Source: Ambulatory Visit | Attending: Orthopedic Surgery | Admitting: Orthopedic Surgery

## 2023-07-15 DIAGNOSIS — M79662 Pain in left lower leg: Secondary | ICD-10-CM | POA: Diagnosis present

## 2023-07-21 ENCOUNTER — Other Ambulatory Visit: Payer: Self-pay

## 2023-07-26 ENCOUNTER — Other Ambulatory Visit: Payer: Self-pay

## 2023-07-26 MED ORDER — OXYCODONE HCL 5 MG PO TABS
5.0000 mg | ORAL_TABLET | ORAL | 0 refills | Status: DC | PRN
  Filled 2023-07-26: qty 30, 3d supply, fill #0

## 2023-07-26 MED ORDER — ONDANSETRON 4 MG PO TBDP
4.0000 mg | ORAL_TABLET | Freq: Three times a day (TID) | ORAL | 0 refills | Status: DC | PRN
  Filled 2023-07-26: qty 20, 7d supply, fill #0

## 2023-07-26 MED ORDER — SULFASALAZINE 500 MG PO TABS
500.0000 mg | ORAL_TABLET | Freq: Two times a day (BID) | ORAL | 0 refills | Status: DC
  Filled 2023-07-26: qty 180, 90d supply, fill #0

## 2023-08-13 ENCOUNTER — Other Ambulatory Visit: Payer: Self-pay

## 2023-08-18 ENCOUNTER — Other Ambulatory Visit: Payer: Self-pay

## 2023-08-27 ENCOUNTER — Other Ambulatory Visit: Payer: Self-pay

## 2023-08-27 MED ORDER — SEMAGLUTIDE-WEIGHT MANAGEMENT 2.4 MG/0.75ML ~~LOC~~ SOAJ: 2.4000 mg | SUBCUTANEOUS | 2 refills | Status: DC

## 2023-08-30 ENCOUNTER — Other Ambulatory Visit: Payer: Self-pay

## 2023-08-30 MED ORDER — ZEPBOUND 7.5 MG/0.5ML ~~LOC~~ SOAJ
7.5000 mg | SUBCUTANEOUS | 5 refills | Status: DC
  Filled 2023-08-30 – 2023-09-02 (×3): qty 2, 28d supply, fill #0
  Filled 2023-10-01: qty 2, 28d supply, fill #1

## 2023-09-01 ENCOUNTER — Other Ambulatory Visit: Payer: Self-pay

## 2023-09-02 ENCOUNTER — Other Ambulatory Visit: Payer: Self-pay

## 2023-09-03 ENCOUNTER — Other Ambulatory Visit: Payer: Self-pay

## 2023-09-21 ENCOUNTER — Ambulatory Visit: Payer: BC Managed Care – PPO | Admitting: Dermatology

## 2023-09-23 ENCOUNTER — Other Ambulatory Visit: Payer: Self-pay

## 2023-10-01 ENCOUNTER — Other Ambulatory Visit: Payer: Self-pay

## 2023-10-06 ENCOUNTER — Ambulatory Visit: Payer: BC Managed Care – PPO | Admitting: Dermatology

## 2023-10-19 ENCOUNTER — Encounter: Payer: Self-pay | Admitting: Dermatology

## 2023-10-19 ENCOUNTER — Ambulatory Visit: Payer: BC Managed Care – PPO | Admitting: Dermatology

## 2023-10-19 DIAGNOSIS — Z79899 Other long term (current) drug therapy: Secondary | ICD-10-CM

## 2023-10-19 DIAGNOSIS — Z86018 Personal history of other benign neoplasm: Secondary | ICD-10-CM

## 2023-10-19 DIAGNOSIS — Z1283 Encounter for screening for malignant neoplasm of skin: Secondary | ICD-10-CM | POA: Diagnosis not present

## 2023-10-19 DIAGNOSIS — L578 Other skin changes due to chronic exposure to nonionizing radiation: Secondary | ICD-10-CM

## 2023-10-19 DIAGNOSIS — D229 Melanocytic nevi, unspecified: Secondary | ICD-10-CM

## 2023-10-19 DIAGNOSIS — L814 Other melanin hyperpigmentation: Secondary | ICD-10-CM

## 2023-10-19 DIAGNOSIS — D225 Melanocytic nevi of trunk: Secondary | ICD-10-CM

## 2023-10-19 DIAGNOSIS — L821 Other seborrheic keratosis: Secondary | ICD-10-CM

## 2023-10-19 DIAGNOSIS — D489 Neoplasm of uncertain behavior, unspecified: Secondary | ICD-10-CM

## 2023-10-19 DIAGNOSIS — D492 Neoplasm of unspecified behavior of bone, soft tissue, and skin: Secondary | ICD-10-CM | POA: Diagnosis not present

## 2023-10-19 DIAGNOSIS — W908XXA Exposure to other nonionizing radiation, initial encounter: Secondary | ICD-10-CM | POA: Diagnosis not present

## 2023-10-19 DIAGNOSIS — D1801 Hemangioma of skin and subcutaneous tissue: Secondary | ICD-10-CM

## 2023-10-19 DIAGNOSIS — L304 Erythema intertrigo: Secondary | ICD-10-CM

## 2023-10-19 DIAGNOSIS — L719 Rosacea, unspecified: Secondary | ICD-10-CM

## 2023-10-19 DIAGNOSIS — Z7189 Other specified counseling: Secondary | ICD-10-CM

## 2023-10-19 NOTE — Progress Notes (Signed)
 Follow-Up Visit   Subjective  Kelsey Randolph is a 48 y.o. female who presents for the following: Skin Cancer Screening and Full Body Skin Exam Hx of multiple dysplastic multiple   The patient presents for Total-Body Skin Exam (TBSE) for skin cancer screening and mole check. The patient has spots, moles and lesions to be evaluated, some may be new or changing and the patient may have concern these could be cancer.  The following portions of the chart were reviewed this encounter and updated as appropriate: medications, allergies, medical history  Review of Systems:  No other skin or systemic complaints except as noted in HPI or Assessment and Plan.  Objective  Well appearing patient in no apparent distress; mood and affect are within normal limits.  A full examination was performed including scalp, head, eyes, ears, nose, lips, neck, chest, axillae, abdomen, back, buttocks, bilateral upper extremities, bilateral lower extremities, hands, feet, fingers, toes, fingernails, and toenails. All findings within normal limits unless otherwise noted below.   Relevant physical exam findings are noted in the Assessment and Plan.  right mid back paraspinal below bra line 0.4 cm irregular brown macule    Assessment & Plan   SKIN CANCER SCREENING PERFORMED TODAY.  ACTINIC DAMAGE - Chronic condition, secondary to cumulative UV/sun exposure - diffuse scaly erythematous macules with underlying dyspigmentation - Recommend daily broad spectrum sunscreen SPF 30+ to sun-exposed areas, reapply every 2 hours as needed.  - Staying in the shade or wearing long sleeves, sun glasses (UVA+UVB protection) and wide brim hats (4-inch brim around the entire circumference of the hat) are also recommended for sun protection.  - Call for new or changing lesions.  LENTIGINES, SEBORRHEIC KERATOSES, HEMANGIOMAS - Benign normal skin lesions - Benign-appearing - Call for any changes  - Hemangioma at left lateral  abdomen  Benign observe.  Checked with dermatoscope confirmed today   MELANOCYTIC NEVI - Tan-brown and/or pink-flesh-colored symmetric macules and papules - Benign appearing on exam today - Observation - Call clinic for new or changing moles - Recommend daily use of broad spectrum spf 30+ sunscreen to sun-exposed areas.   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 Condition is improving with treatment but not currently at goal. Rosacea is a chronic progressive skin condition usually affecting the face of adults, causing redness and/or acne bumps. It is treatable but not curable. It sometimes affects the eyes (ocular rosacea) as well. It may respond to topical and/or systemic medication and can flare with stress, sun exposure, alcohol, exercise, topical steroids (including hydrocortisone/cortisone 10) and some foods.  Daily application of broad spectrum spf 30+ sunscreen to face is recommended to reduce flares.  Patient denies grittiness of the eyes  Treatment Plan Deferred treatment   INTERTRIGO Exam: Erythematous macerated patches in body folds, groin, buttocks Chronic and persistent  condition with duration or expected duration over one  year. Condition is improving with treatment but not currently at goal. Intertrigo is a chronic recurrent rash that occurs in skin fold areas that may be associated with friction; heat; moisture; yeast; fungus; and bacteria.  It is exacerbated by increased movement / activity; sweating; and higher atmospheric temperature.  Use of an absorbant powder such as Zeasorb AF powder or other OTC antifungal powder to the area daily can prevent rash recurrence. Other options to help keep the area dry include blow drying the area after bathing or using antiperspirant products such as Duradry sweat minimizing gel.  Treatment Plan: Restart skin Medicinals Iodoquinol 1%, Hydrocortisone 2.5%, Niacinamide 2% Cream twice a day to affected areas for up to two weeks.   NEOPLASM OF UNCERTAIN BEHAVIOR right mid back paraspinal below bra line Epidermal / dermal shaving  Lesion diameter (cm):  0.4 Informed consent: discussed and consent obtained   Timeout: patient name, date of birth, surgical site, and procedure verified   Procedure prep:  Patient was prepped and draped in usual sterile fashion Prep type:  Isopropyl alcohol Anesthesia: the lesion was anesthetized in a standard fashion   Anesthetic:  1% lidocaine w/ epinephrine 1-100,000 buffered w/ 8.4% NaHCO3 Instrument used: flexible razor blade   Hemostasis achieved with: pressure, aluminum chloride and electrodesiccation   Outcome: patient tolerated procedure well   Post-procedure details: sterile dressing applied and wound care instructions given   Dressing type: bandage and petrolatum   Specimen 1 - Surgical pathology Differential Diagnosis: Irregular nevus r/o dysplasia   Check Margins: No Irregular nevus r/o dysplasia  Return for 9 month tbse .  IAsher Muir, CMA, am acting as scribe for Armida Sans, MD.  Documentation: I have reviewed the above documentation for accuracy and completeness, and I agree with the above.  Armida Sans,  MD

## 2023-10-19 NOTE — Patient Instructions (Addendum)

## 2023-10-21 ENCOUNTER — Telehealth: Payer: Self-pay

## 2023-10-21 ENCOUNTER — Other Ambulatory Visit: Payer: Self-pay

## 2023-10-21 ENCOUNTER — Encounter: Payer: Self-pay | Admitting: Dermatology

## 2023-10-21 LAB — SURGICAL PATHOLOGY

## 2023-10-21 MED ORDER — METHYLPREDNISOLONE 4 MG PO TBPK
ORAL_TABLET | ORAL | 0 refills | Status: DC
  Filled 2023-10-21: qty 21, 6d supply, fill #0

## 2023-10-21 NOTE — Telephone Encounter (Addendum)
 Tried calling patient regarding results. No answer. LM for patient to return call.   ----- Message from Armida Sans sent at 10/21/2023  5:12 PM EDT ----- FINAL DIAGNOSIS        1. Skin, right mid back paraspinal below braline :       DYSPLASTIC JUNCTIONAL NEVUS WITH MODERATE ATYPIA, CLOSE TO MARGIN   Moderate dysplastic Recheck next visit

## 2023-10-25 ENCOUNTER — Telehealth: Payer: Self-pay

## 2023-10-25 NOTE — Telephone Encounter (Signed)
 Patient informed of pathology results

## 2023-10-25 NOTE — Telephone Encounter (Signed)
-----   Message from Armida Sans sent at 10/21/2023  5:12 PM EDT ----- FINAL DIAGNOSIS        1. Skin, right mid back paraspinal below braline :       DYSPLASTIC JUNCTIONAL NEVUS WITH MODERATE ATYPIA, CLOSE TO MARGIN   Moderate dysplastic Recheck next visit

## 2023-10-26 ENCOUNTER — Other Ambulatory Visit: Payer: Self-pay

## 2023-10-26 MED ORDER — ZEPBOUND 10 MG/0.5ML ~~LOC~~ SOAJ
10.0000 mg | SUBCUTANEOUS | 3 refills | Status: DC
  Filled 2023-10-26: qty 2, 28d supply, fill #0
  Filled 2023-11-24: qty 2, 28d supply, fill #1
  Filled 2023-12-27: qty 2, 28d supply, fill #2

## 2023-10-30 ENCOUNTER — Emergency Department (HOSPITAL_COMMUNITY)
Admission: EM | Admit: 2023-10-30 | Discharge: 2023-10-31 | Disposition: A | Discharge status: Home / Self Care | Attending: Emergency Medicine | Admitting: Emergency Medicine

## 2023-10-30 ENCOUNTER — Other Ambulatory Visit: Payer: Self-pay

## 2023-10-30 ENCOUNTER — Encounter (HOSPITAL_COMMUNITY): Payer: Self-pay | Admitting: Emergency Medicine

## 2023-10-30 DIAGNOSIS — D72829 Elevated white blood cell count, unspecified: Secondary | ICD-10-CM | POA: Insufficient documentation

## 2023-10-30 DIAGNOSIS — E039 Hypothyroidism, unspecified: Secondary | ICD-10-CM | POA: Insufficient documentation

## 2023-10-30 DIAGNOSIS — N201 Calculus of ureter: Secondary | ICD-10-CM | POA: Diagnosis not present

## 2023-10-30 DIAGNOSIS — Z7982 Long term (current) use of aspirin: Secondary | ICD-10-CM | POA: Diagnosis not present

## 2023-10-30 DIAGNOSIS — R109 Unspecified abdominal pain: Secondary | ICD-10-CM | POA: Diagnosis present

## 2023-10-30 LAB — URINALYSIS, ROUTINE W REFLEX MICROSCOPIC
Bilirubin Urine: NEGATIVE
Glucose, UA: NEGATIVE mg/dL
Ketones, ur: NEGATIVE mg/dL
Nitrite: NEGATIVE
Protein, ur: 30 mg/dL — AB
Specific Gravity, Urine: 1.025 (ref 1.005–1.030)
pH: 6 (ref 5.0–8.0)

## 2023-10-30 LAB — URINALYSIS, MICROSCOPIC (REFLEX): RBC / HPF: >50 RBC/hpf (ref 0–5)

## 2023-10-30 LAB — PREGNANCY, URINE: Preg Test, Ur: NEGATIVE

## 2023-10-30 NOTE — ED Triage Notes (Signed)
 Pt in with sharp L flank pain that began around 5pm. Reports hx of known L kidney stones requiring lithotripsy. Pt states the pain feels similar to previous passing of stones. Denies fevers, +N/V

## 2023-10-31 ENCOUNTER — Emergency Department (HOSPITAL_COMMUNITY)

## 2023-10-31 LAB — CBC WITH DIFFERENTIAL/PLATELET
Abs Immature Granulocytes: 0.1 10*3/uL — ABNORMAL HIGH (ref 0.00–0.07)
Basophils Absolute: 0.1 10*3/uL (ref 0.0–0.1)
Basophils Relative: 0 %
Eosinophils Absolute: 0.1 10*3/uL (ref 0.0–0.5)
Eosinophils Relative: 0 %
HCT: 50.7 % — ABNORMAL HIGH (ref 36.0–46.0)
Hemoglobin: 16.7 g/dL — ABNORMAL HIGH (ref 12.0–15.0)
Immature Granulocytes: 1 %
Lymphocytes Relative: 27 %
Lymphs Abs: 4.4 10*3/uL — ABNORMAL HIGH (ref 0.7–4.0)
MCH: 29.4 pg (ref 26.0–34.0)
MCHC: 32.9 g/dL (ref 30.0–36.0)
MCV: 89.3 fL (ref 80.0–100.0)
Monocytes Absolute: 0.8 10*3/uL (ref 0.1–1.0)
Monocytes Relative: 5 %
Neutro Abs: 10.9 10*3/uL — ABNORMAL HIGH (ref 1.7–7.7)
Neutrophils Relative %: 67 %
Platelets: 377 10*3/uL (ref 150–400)
RBC: 5.68 MIL/uL — ABNORMAL HIGH (ref 3.87–5.11)
RDW: 13.4 % (ref 11.5–15.5)
WBC: 16.4 10*3/uL — ABNORMAL HIGH (ref 4.0–10.5)
nRBC: 0 % (ref 0.0–0.2)

## 2023-10-31 LAB — BASIC METABOLIC PANEL
Anion gap: 11 (ref 5–15)
BUN: 14 mg/dL (ref 6–20)
CO2: 25 mmol/L (ref 22–32)
Calcium: 9 mg/dL (ref 8.9–10.3)
Chloride: 102 mmol/L (ref 98–111)
Creatinine, Ser: 0.76 mg/dL (ref 0.44–1.00)
GFR, Estimated: >60 mL/min (ref >60)
Glucose, Bld: 118 mg/dL — ABNORMAL HIGH (ref 70–99)
Potassium: 3.4 mmol/L — ABNORMAL LOW (ref 3.5–5.1)
Sodium: 138 mmol/L (ref 135–145)

## 2023-10-31 MED ORDER — ONDANSETRON HCL 4 MG/2ML IJ SOLN
4.0000 mg | Freq: Once | INTRAMUSCULAR | Status: AC
  Administered 2023-10-31: 4 mg via INTRAVENOUS
  Filled 2023-10-31: qty 2

## 2023-10-31 MED ORDER — ONDANSETRON 4 MG PO TBDP: 4.0000 mg | ORAL_TABLET | Freq: Three times a day (TID) | ORAL | 0 refills | Status: AC | PRN

## 2023-10-31 MED ORDER — OXYCODONE HCL 5 MG PO TABS: 5.0000 mg | ORAL_TABLET | ORAL | 0 refills | Status: AC | PRN

## 2023-10-31 NOTE — ED Provider Notes (Signed)
 [place] EMERGENCY DEPARTMENT AT [facility] Provider Note   CSN: [id number] Arrival date & time: 10/30/23  2007     History  Chief Complaint  Patient presents with   Flank Pain    Uniqua Kihn Halpin is a 48 y.o. female.  The history is provided by the patient and medical records.  Flank Pain   48 y.o. female with history of kidney stones, obesity, depression, acid reflux, hypothyroidism, presenting to the ED with left flank pain.  Began around 5 PM, had a few episodes and vomiting.  Does seem to have calm down some after home Zofran and oxycodone and applying ice pack to her left flank.  Does have history of very large stone on her left side, followed by [facility] urologic for same.  She denies any fever or chills.  No dysuria or noted hematuria.  Has had prior lithotripsy.  Home Medications Prior to Admission medications   Medication Sig Start Date End Date Taking? Authorizing Provider  ondansetron (ZOFRAN-ODT) 4 MG disintegrating tablet Take 1 tablet (4 mg total) by mouth every 8 (eight) hours as needed. 10/31/23  Yes Garlon Hatchet, PA-C  oxyCODONE (ROXICODONE) 5 MG immediate release tablet Take 1 tablet (5 mg total) by mouth every 4 (four) hours as needed for severe pain (pain score 7-10). 10/31/23  Yes Garlon Hatchet, PA-C  acetaminophen (TYLENOL) 500 MG tablet Take 2 tablets (1,000 mg total) by mouth every 8 (eight) hours. 07/13/23 07/12/24  Signa Kell, MD  Acetaminophen Extra Strength 500 MG TABS Take 2 tablets (1,000 mg total) by mouth every 8 (eight) hours. 07/13/23   Signa Kell, MD  ALPRAZolam Prudy Feeler) 0.5 MG tablet Take 1 tablet (0.5 mg total) by mouth at bedtime as needed for anxiety 06/23/23     aspirin EC 325 MG tablet Take 1 tablet (325 mg total) by mouth daily for 14 days. 07/13/23     buPROPion (WELLBUTRIN XL) 300 MG 24 hr tablet Take 1 tablet (300 mg total) by mouth daily. 06/23/23     celecoxib (CELEBREX) 100 MG capsule Take 1 capsule (100 mg total) by  mouth daily as needed for pain Patient taking differently: Take 100 mg by mouth daily as needed for moderate pain (pain score 4-6) or mild pain (pain score 1-3). 06/23/23     cetirizine (ZYRTEC) 10 MG tablet Take 10 mg by mouth every morning.    [provider]  COSENTYX SENSOREADY, 300 MG, 150 MG/ML SOAJ Inject 300 mg into the skin every 28 (twenty-eight) days.    [provider]  Cyanocobalamin (VITAMIN B-12) 5000 MCG SUBL Place 1 tablet under the tongue daily.    [provider]  cyclobenzaprine (FLEXERIL) 10 MG tablet Take 1 tablet (10 mg total) by mouth 2 (two) times daily as needed for Muscle spasms 02/17/23     docusate sodium (COLACE) 100 MG capsule Take 100 mg by mouth 2 (two) times daily.    [provider]  hydrochlorothiazide (HYDRODIURIL) 25 MG tablet Take 1 tablet (25 mg total) by mouth once daily 06/23/23     levothyroxine (SYNTHROID) 50 MCG tablet Take 1 tablet (50 mcg total) by mouth daily. Take on an empty stomach with a glass of water at least 30-60 minutes before breakfast. 06/23/23     neomycin-polymyxin-dexamethasone (MAXITROL) 0.1 % ophthalmic suspension Place 1 drop into affected eye 3 (three) times daily. Patient taking differently: Place 1 drop into both eyes daily as needed (Allergies). 01/19/23  Norgestim-Eth Estrad Triphasic (NORGESTIMATE-ETHINYL ESTRADIOL TRIPHASIC) 0.18/0.215/0.25 MG-25 MCG tab Take 1 tablet by mouth once daily 12/09/22     Omega-3 Fatty Acids (FISH OIL PO) Take 2,000 Units by mouth daily.    [provider]  pantoprazole (PROTONIX) 40 MG tablet Take 1 tablet (40 mg total) by mouth once daily for 90 days Patient taking differently: Take 40 mg by mouth daily as needed (Acid reflux). 06/23/23     potassium chloride (KLOR-CON) 10 MEQ tablet Take 1 tablet (10 mEq total) by mouth once daily 11/20/22     Probiotic Product (PROBIOTIC PO) Take 1 capsule by mouth daily.    [provider]  Semaglutide-Weight  Management (WEGOVY) 2.4 MG/0.75ML SOAJ Inject 2.4 mg into the skin once a week. Patient taking differently: Inject 2.4 mg into the skin once a week. Monday 06/23/23     Semaglutide-Weight Management 2.4 MG/0.75ML SOAJ Inject 2.4 mg into the skin once a week. 08/26/23   Fields, Lisabeth Pick, NP  sulfaSALAzine (AZULFIDINE) 500 MG tablet Take 500 mg by mouth 2 (two) times daily.    [provider]  sulfaSALAzine (AZULFIDINE) 500 MG tablet Take 1 tablet (500 mg total) by mouth 2 (two) times daily. 07/26/23     tirzepatide (ZEPBOUND) 10 MG/0.5ML Pen Inject 10 mg into the skin every 7 (seven) days. 10/26/23     VITAMIN D-VITAMIN K PO Take 1 tablet by mouth daily at 6 (six) AM.    [provider]      Allergies    Patient has no known allergies.    Review of Systems   Review of Systems  Genitourinary:  Positive for flank pain.  All other systems reviewed and are negative.   Physical Exam Updated Vital Signs BP 109/70   Pulse 95   Temp 98.4 F (36.9 C) (Oral)   Resp 18   Wt 120.2 kg   LMP 10/04/2023 (Exact Date)   SpO2 100%   BMI 46.94 kg/m   Physical Exam Vitals and nursing note reviewed.  Constitutional:      Appearance: She is well-developed.  HENT:     Head: Normocephalic and atraumatic.  Eyes:     Conjunctiva/sclera: Conjunctivae normal.     Pupils: Pupils are equal, round, and reactive to light.  Cardiovascular:     Rate and Rhythm: Normal rate and regular rhythm.     Heart sounds: Normal heart sounds.  Pulmonary:     Effort: Pulmonary effort is normal.     Breath sounds: Normal breath sounds.  Abdominal:     General: Bowel sounds are normal.     Palpations: Abdomen is soft.     Tenderness: There is no abdominal tenderness. There is no right CVA tenderness or left CVA tenderness.     Comments: Left CVA is cool to touch (icepack in use), no peritoneal signs  Musculoskeletal:        General: Normal range of motion.     Cervical back: Normal range of  motion.  Skin:    General: Skin is warm and dry.  Neurological:     Mental Status: She is alert and oriented to person, place, and time.     ED Results / Procedures / Treatments   Labs (all labs ordered are listed, but only abnormal results are displayed) Labs Reviewed  URINALYSIS, ROUTINE W REFLEX MICROSCOPIC - Abnormal; Notable for the following components:      Result Value   Hgb urine dipstick LARGE (*)  Protein, ur 30 (*)    Leukocytes,Ua TRACE (*)    All other components within normal limits  URINALYSIS, MICROSCOPIC (REFLEX) - Abnormal; Notable for the following components:   Bacteria, UA RARE (*)    All other components within normal limits  CBC WITH DIFFERENTIAL/PLATELET - Abnormal; Notable for the following components:   WBC 16.4 (*)    RBC 5.68 (*)    Hemoglobin 16.7 (*)    HCT 50.7 (*)    Neutro Abs 10.9 (*)    Lymphs Abs 4.4 (*)    Abs Immature Granulocytes 0.10 (*)    All other components within normal limits  BASIC METABOLIC PANEL - Abnormal; Notable for the following components:   Potassium 3.4 (*)    Glucose, Bld 118 (*)    All other components within normal limits  PREGNANCY, URINE    EKG None  Radiology CT Renal Stone Study Result Date: 10/31/2023 CLINICAL DATA:  Flank pain EXAM: CT ABDOMEN AND PELVIS WITHOUT CONTRAST TECHNIQUE: Multidetector CT imaging of the abdomen and pelvis was performed following the standard protocol without IV contrast. RADIATION DOSE REDUCTION: This exam was performed according to the departmental dose-optimization program which includes automated exposure control, adjustment of the mA and/or kV according to patient size and/or use of iterative reconstruction technique. COMPARISON:  11/28/2021 FINDINGS: Lower chest: No acute abnormality. Hepatobiliary: No focal liver abnormality is seen. No gallstones, gallbladder wall thickening, or biliary dilatation. Pancreas: Unremarkable. No pancreatic ductal dilatation or surrounding  inflammatory changes. Spleen: Normal in size without focal abnormality. Adrenals/Urinary Tract: Adrenal glands are within normal limits. Kidneys are well visualized bilaterally. Scattered left lower pole renal calculi are noted without obstructive change. The largest of these measures 7 mm in greatest dimension. Mild fullness of the left collecting system is noted secondary to a 2 mm stone at the left UVJ best noted on image number 76 of series 6. The bladder is decompressed. Stomach/Bowel: Appendix is within normal limits. No obstructive or inflammatory changes of the colon are seen. Small bowel and stomach are unremarkable. Vascular/Lymphatic: No significant vascular findings are present. No enlarged abdominal or pelvic lymph nodes. Reproductive: Uterus and bilateral adnexa are unremarkable. Other: No abdominal wall hernia or abnormality. No abdominopelvic ascites. Musculoskeletal: No acute or significant osseous findings. IMPRESSION: 2 mm distal left ureteral stone with mild obstructive change. Multiple left lower pole renal calculi are noted measuring up to 7 mm. Electronically Signed   By: Alcide Clever M.D.   On: 10/31/2023 01:11    Procedures Procedures    Medications Ordered in ED Medications  ondansetron (ZOFRAN) injection 4 mg (4 mg Intravenous Given 10/31/23 0315)    ED Course/ Medical Decision Making/ A&P                                 Medical Decision Making Amount and/or Complexity of Data Reviewed Labs: ordered. Radiology: ordered and independent interpretation performed. ECG/medicine tests: ordered and independent interpretation performed.  Risk Prescription drug management.   75 female presenting to the ED with left flank pain.  Does have history of kidney stones and feels similar.  She did discuss with her urologist and encouraged ER evaluation for CT.  She is afebrile and nontoxic in appearance here.  She has been using ice and took a home oxycodone prior to arrival with  good improvement of her symptoms.  She does not have any peritoneal signs on exam.  UA with hematuria but no acute signs of infection.  Will check basic labs, CT renal stone study.  Labs as above--does have leukocytosis, may be reactive as she does have some vomiting prior to arrival.  Renal function is normal.  CT with 2 mm left distal ureteral calculus.  Larger stone still present in left kidney.  Results discussed with patient.  Her symptoms remain well-controlled here.  Only has a few of her leftover oxycodone at home so was given prescription for same if needed.  She will arrange follow-up with her urologist in [place].  Can return here for new concerns.  Final Clinical Impression(s) / ED Diagnoses Final diagnoses:  Left ureteral stone    Rx / DC Orders ED Discharge Orders          Ordered    oxyCODONE (ROXICODONE) 5 MG immediate release tablet  Every 4 hours PRN        10/31/23 0310    ondansetron (ZOFRAN-ODT) 4 MG disintegrating tablet  Every 8 hours PRN        10/31/23 0310              Garlon Hatchet, PA-C 10/31/23 0319    Glynn Octave, MD 10/31/23 [phone]

## 2023-10-31 NOTE — ED Provider Notes (Incomplete)
 [place] EMERGENCY DEPARTMENT AT [facility] Provider Note   CSN: [id number] Arrival date & time: 10/30/23  2007     History {Add pertinent medical, surgical, social history, OB history to HPI:1} Chief Complaint  Patient presents with  . Flank Pain    Kelsey Randolph is a 48 y.o. female.  The history is provided by the patient and medical records.  Flank Pain   48 y.o. female with history of kidney stones, obesity, depression, acid reflux, hypothyroidism, presenting to the ED with left flank pain.  Began around 5 PM, had a few episodes and vomiting.  Does seem to have calm down some after home Zofran and oxycodone and applying ice pack to her left flank.  Does have history of very large stone on her left side, followed by [facility] urologic for same.  She denies any fever or chills.  No dysuria or noted hematuria.  Has had prior lithotripsy.  Home Medications Prior to Admission medications   Medication Sig Start Date End Date Taking? Authorizing Provider  acetaminophen (TYLENOL) 500 MG tablet Take 2 tablets (1,000 mg total) by mouth every 8 (eight) hours. 07/13/23 07/12/24  Signa Kell, MD  Acetaminophen Extra Strength 500 MG TABS Take 2 tablets (1,000 mg total) by mouth every 8 (eight) hours. 07/13/23   Signa Kell, MD  ALPRAZolam Prudy Feeler) 0.5 MG tablet Take 1 tablet (0.5 mg total) by mouth at bedtime as needed for anxiety 06/23/23     aspirin EC 325 MG tablet Take 1 tablet (325 mg total) by mouth daily for 14 days. 07/13/23     buPROPion (WELLBUTRIN XL) 300 MG 24 hr tablet Take 1 tablet (300 mg total) by mouth daily. 06/23/23     celecoxib (CELEBREX) 100 MG capsule Take 1 capsule (100 mg total) by mouth daily as needed for pain Patient taking differently: Take 100 mg by mouth daily as needed for moderate pain (pain score 4-6) or mild pain (pain score 1-3). 06/23/23     cetirizine (ZYRTEC) 10 MG tablet Take 10 mg by mouth every morning.    [provider]   COSENTYX SENSOREADY, 300 MG, 150 MG/ML SOAJ Inject 300 mg into the skin every 28 (twenty-eight) days.    [provider]  Cyanocobalamin (VITAMIN B-12) 5000 MCG SUBL Place 1 tablet under the tongue daily.    [provider]  cyclobenzaprine (FLEXERIL) 10 MG tablet Take 1 tablet (10 mg total) by mouth 2 (two) times daily as needed for Muscle spasms 02/17/23     docusate sodium (COLACE) 100 MG capsule Take 100 mg by mouth 2 (two) times daily.    [provider]  hydrochlorothiazide (HYDRODIURIL) 25 MG tablet Take 1 tablet (25 mg total) by mouth once daily 06/23/23     levothyroxine (SYNTHROID) 50 MCG tablet Take 1 tablet (50 mcg total) by mouth daily. Take on an empty stomach with a glass of water at least 30-60 minutes before breakfast. 06/23/23     neomycin-polymyxin-dexamethasone (MAXITROL) 0.1 % ophthalmic suspension Place 1 drop into affected eye 3 (three) times daily. Patient taking differently: Place 1 drop into both eyes daily as needed (Allergies). 01/19/23     Norgestim-Eth Estrad Triphasic (NORGESTIMATE-ETHINYL ESTRADIOL TRIPHASIC) 0.18/0.215/0.25 MG-25 MCG tab Take 1 tablet by mouth once daily 12/09/22     Omega-3 Fatty Acids (FISH OIL PO) Take 2,000 Units by mouth daily.    [provider]  ondansetron (ZOFRAN-ODT) 4 MG disintegrating tablet Take 1 tablet (4 mg total) by  mouth every 8 (eight) hours as needed for nausea or vomiting. 11/10/21   Chesley Noon, MD  ondansetron (ZOFRAN-ODT) 4 MG disintegrating tablet Take 1 tablet (4 mg total) by mouth every 8 (eight) hours as needed for nausea or vomiting. 07/13/23   Signa Kell, MD  oxyCODONE (OXY IR/ROXICODONE) 5 MG immediate release tablet Take 1-2 tablets (5-10 mg total) by mouth every 4 (four) hours as needed for pain. 07/26/23     pantoprazole (PROTONIX) 40 MG tablet Take 1 tablet (40 mg total) by mouth once daily for 90 days Patient taking differently: Take 40 mg by mouth daily as needed (Acid reflux).  06/23/23     potassium chloride (KLOR-CON) 10 MEQ tablet Take 1 tablet (10 mEq total) by mouth once daily 11/20/22     Probiotic Product (PROBIOTIC PO) Take 1 capsule by mouth daily.    [provider]  Semaglutide-Weight Management (WEGOVY) 2.4 MG/0.75ML SOAJ Inject 2.4 mg into the skin once a week. Patient taking differently: Inject 2.4 mg into the skin once a week. Monday 06/23/23     Semaglutide-Weight Management 2.4 MG/0.75ML SOAJ Inject 2.4 mg into the skin once a week. 08/26/23   Fields, Lisabeth Pick, NP  sulfaSALAzine (AZULFIDINE) 500 MG tablet Take 500 mg by mouth 2 (two) times daily.    [provider]  sulfaSALAzine (AZULFIDINE) 500 MG tablet Take 1 tablet (500 mg total) by mouth 2 (two) times daily. 07/26/23     tirzepatide (ZEPBOUND) 10 MG/0.5ML Pen Inject 10 mg into the skin every 7 (seven) days. 10/26/23     VITAMIN D-VITAMIN K PO Take 1 tablet by mouth daily at 6 (six) AM.    [provider]      Allergies    Patient has no known allergies.    Review of Systems   Review of Systems  Genitourinary:  Positive for flank pain.    Physical Exam Updated Vital Signs BP 132/89 (BP Location: Right Arm)   Pulse 99   Temp 97.9 F (36.6 C)   Resp 16   Wt 120.2 kg   LMP 10/04/2023 (Exact Date)   SpO2 100%   BMI 46.94 kg/m  Physical Exam  ED Results / Procedures / Treatments   Labs (all labs ordered are listed, but only abnormal results are displayed) Labs Reviewed  URINALYSIS, ROUTINE W REFLEX MICROSCOPIC - Abnormal; Notable for the following components:      Result Value   Hgb urine dipstick LARGE (*)    Protein, ur 30 (*)    Leukocytes,Ua TRACE (*)    All other components within normal limits  URINALYSIS, MICROSCOPIC (REFLEX) - Abnormal; Notable for the following components:   Bacteria, UA RARE (*)    All other components within normal limits  PREGNANCY, URINE  CBC WITH DIFFERENTIAL/PLATELET  BASIC METABOLIC PANEL     EKG None  Radiology No results found.  Procedures Procedures  {Document cardiac monitor, telemetry assessment procedure when appropriate:1}  Medications Ordered in ED Medications - No data to display  ED Course/ Medical Decision Making/ A&P   {   Click here for ABCD2, HEART and other calculatorsREFRESH Note before signing :1}                              Medical Decision Making Amount and/or Complexity of Data Reviewed Labs: ordered.   ***  {Document critical care time when appropriate:1} {Document review of labs and clinical decision tools  ie heart score, Chads2Vasc2 etc:1}  {Document your independent review of radiology images, and any outside records:1} {Document your discussion with family members, caretakers, and with consultants:1} {Document social determinants of health affecting pt's care:1} {Document your decision making why or why not admission, treatments were needed:1} Final Clinical Impression(s) / ED Diagnoses Final diagnoses:  None    Rx / DC Orders ED Discharge Orders     None

## 2023-10-31 NOTE — Discharge Instructions (Addendum)
 Small, 2mm left ureteral stone seen on CT today. Kidney function testing is normal.  Urine without signs of infection. Can continue pain and nausea meds as needed. Follow-up with urology. Return here for new concerns.

## 2023-11-05 ENCOUNTER — Encounter: Payer: Self-pay | Admitting: Physician Assistant

## 2023-11-05 ENCOUNTER — Ambulatory Visit: Admitting: Physician Assistant

## 2023-11-05 ENCOUNTER — Other Ambulatory Visit: Payer: Self-pay

## 2023-11-05 VITALS — BP 124/85 | HR 97 | Ht 62.0 in | Wt 255.0 lb

## 2023-11-05 DIAGNOSIS — R300 Dysuria: Secondary | ICD-10-CM

## 2023-11-05 DIAGNOSIS — N201 Calculus of ureter: Secondary | ICD-10-CM

## 2023-11-05 LAB — URINALYSIS, COMPLETE
Bilirubin, UA: NEGATIVE
Glucose, UA: NEGATIVE
Ketones, UA: NEGATIVE
Leukocytes,UA: NEGATIVE
Nitrite, UA: NEGATIVE
Protein,UA: NEGATIVE
Specific Gravity, UA: >1.03 — ABNORMAL HIGH (ref 1.005–1.030)
Urobilinogen, Ur: 0.2 mg/dL (ref 0.2–1.0)
pH, UA: 5.5 (ref 5.0–7.5)

## 2023-11-05 LAB — MICROSCOPIC EXAMINATION

## 2023-11-05 MED ORDER — TAMSULOSIN HCL 0.4 MG PO CAPS
0.4000 mg | ORAL_CAPSULE | Freq: Every day | ORAL | 0 refills | Status: AC
  Filled 2023-11-05: qty 14, 14d supply, fill #0

## 2023-11-05 MED ORDER — CEPHALEXIN 500 MG PO CAPS
500.0000 mg | ORAL_CAPSULE | Freq: Two times a day (BID) | ORAL | 0 refills | Status: AC
  Filled 2023-11-05: qty 14, 7d supply, fill #0

## 2023-11-05 NOTE — Progress Notes (Signed)
 11/05/2023 10:23 AM   Kelsey Randolph 1976/07/26 [id number]  CC: Chief Complaint  Patient presents with   Follow-up   HPI: Kelsey Randolph is a 48 y.o. female with PMH nephrolithiasis with a known left lower pole stone burden on surveillance who presents today for ED follow-up of a 2 mm distal left ureteral stone.   She was seen at the [facility] ED 6 days ago with reports of left flank pain. CT stone study showed a 2 mm distal left ureteral stone with multiple left lower pole stones measuring up to 7 mm.  UA was notable for no nitrites, >50 RBC/hpf, no pyuria, and rare bacteriuria.  She was discharged on Zofran and oxycodone.  Today she reports her pain has resolved but she is having urinary frequency and intermittent stream as well as tingling with urination.  She suspects the stone has passed into her bladder and she has not voided out yet.  Left lower pole stone burden appears stable in size compared to prior KUB dated 06/28/2023.  In-office UA today positive for trace intact blood; urine microscopy with 6-10 WBCs/HPF, 3-10 RBCs/HPF, and moderate bacteria.  PMH: Past Medical History:  Diagnosis Date   Allergy    Anxiety    Arthritis    PSORIATIC   Borderline diabetes    Complication of anesthesia    Depression    Dysplastic nevus 07/27/2019   Right lateral bicep. Severe atypia, close to margin. Excised 11/14/2019, margins free.   Dysplastic nevus 07/30/2021   right mid back paraspinal, mod atypia   Dysplastic nevus 07/30/2022   Right Mid Buttocks, Severe atypia, Excised 09/08/22   Dysplastic nevus 10/18/2023   right mid back paraspinal below braline moderate dysplastic will recheck at next follow up   GERD (gastroesophageal reflux disease)    RARE-NO MEDS   History of kidney stones 2004   History of tobacco use    Hx of dysplastic nevus 07/16/2016   Right superior medial buttocks just lateral to superior crease. Mild atypia clost to deep margin   Hx of dysplastic  nevus 07/12/2017   Left upper back sup. scapula. Severe atypia with focal fibrosis, close to lateral margin. Excised: 09/28/2017. Margins free   Hx of dysplastic nevus 07/12/2017   Left LQA periumbilical. Moderate atypia, irritated, close to lateral margin   Hx of dysplastic nevus 07/27/2019, exc 11/14/19   Right lateral bicep. Severe atypia, close to margin   Hypothyroidism    Morbidly obese (HCC)    PONV (postoperative nausea and vomiting)    Psoriasis    Cosentyx   Tachycardia     Surgical History: Past Surgical History:  Procedure Laterality Date   FOOT FRACTURE SURGERY Left 2012   LITHOTRIPSY  08/10/2002   SHOULDER ARTHROSCOPY W/ SUBACROMIAL DECOMPRESSION AND DISTAL CLAVICLE EXCISION Left 10/27/2022   TARSAL TUNNEL RELEASE Left 04/16/2017   Procedure: TARSAL TUNNEL RELEASE;  Surgeon: Gwyneth Revels, DPM;  Location: [facility] ORS;  Service: Podiatry;  Laterality: Left;   TENDON REPAIR Left 04/16/2017   Procedure: FLEXOR TENDON REPAIR-SECONDARY ;  Surgeon: Gwyneth Revels, DPM;  Location: [facility] ORS;  Service: Podiatry;  Laterality: Left;   TONSILLECTOMY AND ADENOIDECTOMY  08/11/1991    Home Medications:  Allergies as of 11/05/2023   No Known Allergies      Medication List        Accurate as of November 05, 2023 10:23 AM. If you have any questions, ask your nurse or doctor.  STOP taking these medications    acetaminophen 500 MG tablet Commonly known as: TYLENOL Stopped by: Carman Ching   Acetaminophen Extra Strength 500 MG Tabs Stopped by: Carman Ching   aspirin EC 325 MG tablet Stopped by: Carman Ching   cyclobenzaprine 10 MG tablet Commonly known as: FLEXERIL Stopped by: Carman Ching   neomycin-polymyxin b-dexamethasone 3.5-10000-0.1 Susp Commonly known as: MAXITROL Stopped by: Carman Ching   pantoprazole 40 MG tablet Commonly known as: PROTONIX Stopped by: Carman Ching   Semaglutide-Weight  Management 2.4 MG/0.75ML Soaj Stopped by: Drema Halon 2.4 MG/0.75ML Soaj Generic drug: Semaglutide-Weight Management Stopped by: Carman Ching       TAKE these medications    ALPRAZolam 0.5 MG tablet Commonly known as: XANAX Take 1 tablet (0.5 mg total) by mouth at bedtime as needed for anxiety   buPROPion 300 MG 24 hr tablet Commonly known as: WELLBUTRIN XL Take 1 tablet (300 mg total) by mouth daily.   celecoxib 100 MG capsule Commonly known as: CELEBREX Take 1 capsule (100 mg total) by mouth daily as needed for pain What changed:  when to take this reasons to take this   cetirizine 10 MG tablet Commonly known as: ZYRTEC Take 10 mg by mouth every morning.   Cosentyx Sensoready (300 MG) 150 MG/ML Soaj Generic drug: Secukinumab (300 MG Dose) Inject 300 mg into the skin every 28 (twenty-eight) days.   docusate sodium 100 MG capsule Commonly known as: COLACE Take 100 mg by mouth 2 (two) times daily.   FISH OIL PO Take 2,000 Units by mouth daily.   hydrochlorothiazide 25 MG tablet Commonly known as: HYDRODIURIL Take 1 tablet (25 mg total) by mouth once daily   levothyroxine 50 MCG tablet Commonly known as: SYNTHROID Take 1 tablet (50 mcg total) by mouth daily. Take on an empty stomach with a glass of water at least 30-60 minutes before breakfast.   ondansetron 4 MG disintegrating tablet Commonly known as: ZOFRAN-ODT Take 1 tablet (4 mg total) by mouth every 8 (eight) hours as needed.   oxyCODONE 5 MG immediate release tablet Commonly known as: Roxicodone Take 1 tablet (5 mg total) by mouth every 4 (four) hours as needed for severe pain (pain score 7-10).   potassium chloride 10 MEQ tablet Commonly known as: KLOR-CON Take 1 tablet (10 mEq total) by mouth once daily   PROBIOTIC PO Take 1 capsule by mouth daily.   sulfaSALAzine 500 MG tablet Commonly known as: AZULFIDINE Take 1 tablet (500 mg total) by mouth 2 (two) times  daily. What changed: Another medication with the same name was removed. Continue taking this medication, and follow the directions you see here. Changed by: Carman Ching   Tri-VyLibra Lo 0.18/0.215/0.25 MG-25 MCG Tabs Generic drug: Norgestimate-Eth Estradiol Take 1 tablet by mouth once daily   Vitamin B-12 5000 MCG Subl Place 1 tablet under the tongue daily.   VITAMIN D-VITAMIN K PO Take 1 tablet by mouth daily at 6 (six) AM.   Zepbound 10 MG/0.5ML Pen Generic drug: tirzepatide Inject 10 mg into the skin every 7 (seven) days.        Allergies:  No Known Allergies  Family History: Family History  Problem Relation Age of Onset   Depression Mother    Hyperlipidemia Mother    Hypertension Mother    Healthy Brother    Heart disease Paternal Grandfather        MI; CAD   Heart disease Father    Dementia Maternal  Grandmother    Diabetes Maternal Grandmother    Breast cancer Neg Hx     Social History:   reports that she quit smoking about 8 years ago. Her smoking use included cigarettes. She started smoking about 28 years ago. She has a 10 pack-year smoking history. She has never used smokeless tobacco. She reports current alcohol use. She reports that she does not use drugs.  Physical Exam: BP 124/85   Pulse 97   Ht 5\' 2"  (1.575 m)   Wt 255 lb (115.7 kg)   LMP 10/04/2023 (Exact Date)   BMI 46.64 kg/m   Constitutional:  Alert and oriented, no acute distress, nontoxic appearing HEENT: [place], AT Cardiovascular: No clubbing, cyanosis, or edema Respiratory: Normal respiratory effort, no increased work of breathing Skin: No rashes, bruises or suspicious lesions Neurologic: Grossly intact, no focal deficits, moving all 4 extremities Psychiatric: Normal mood and affect  Laboratory Data: Results for orders placed or performed in visit on 11/05/23  Microscopic Examination   Collection Time: 11/05/23 10:09 AM   Urine  Result Value Ref Range   WBC, UA 6-10 (A) 0 - 5  /hpf   RBC, Urine 3-10 (A) 0 - 2 /hpf   Epithelial Cells (non renal) 0-10 0 - 10 /hpf   Mucus, UA Present (A) Not Estab.   Bacteria, UA Moderate (A) None seen/Few  Urinalysis, Complete   Collection Time: 11/05/23 10:09 AM  Result Value Ref Range   Specific Gravity, UA >1.030 (H) 1.005 - 1.030   pH, UA 5.5 5.0 - 7.5   Color, UA Yellow Yellow   Appearance Ur Clear Clear   Leukocytes,UA Negative Negative   Protein,UA Negative Negative/Trace   Glucose, UA Negative Negative   Ketones, UA Negative Negative   RBC, UA Trace (A) Negative   Bilirubin, UA Negative Negative   Urobilinogen, Ur 0.2 0.2 - 1.0 mg/dL   Nitrite, UA Negative Negative   Microscopic Examination See below:    Assessment & Plan:   1. Left ureteral stone (Primary) Pain resolved.  She is having some LUTS consistent with bladder stone with impending passage.  We discussed that she is statistically very likely to pass the stone on her own, will give her Flomax to help with MET.  We discussed that she should return if her symptoms recur. - Urinalysis, Complete - tamsulosin (FLOMAX) 0.4 MG CAPS capsule; Take 1 capsule (0.4 mg total) by mouth daily.  Dispense: 14 capsule; Refill: 0  2. Dysuria UA today is suspicious for acute stone episode versus possible UTI.  Given reports of discomfort with voiding, will start empiric Keflex and send for culture for further evaluation. - CULTURE, URINE COMPREHENSIVE - cephALEXin (KEFLEX) 500 MG capsule; Take 1 capsule (500 mg total) by mouth 2 (two) times daily for 7 days.  Dispense: 14 capsule; Refill: 0   Return if symptoms worsen or fail to improve.  Carman Ching, PA-C  [facility] [place] [address], [address] [place], [place] [address] [phone]

## 2023-11-07 LAB — CULTURE, URINE COMPREHENSIVE

## 2023-11-09 ENCOUNTER — Ambulatory Visit: Admitting: Physician Assistant

## 2023-11-24 ENCOUNTER — Other Ambulatory Visit: Payer: Self-pay

## 2023-11-24 MED ORDER — SULFASALAZINE 500 MG PO TABS
500.0000 mg | ORAL_TABLET | Freq: Two times a day (BID) | ORAL | 1 refills | Status: AC
  Filled 2023-11-24: qty 180, 90d supply, fill #0

## 2023-11-24 MED ORDER — POTASSIUM CHLORIDE ER 10 MEQ PO TBCR
10.0000 meq | EXTENDED_RELEASE_TABLET | Freq: Every day | ORAL | 11 refills | Status: AC
  Filled 2023-11-24: qty 30, 30d supply, fill #0
  Filled 2023-12-27: qty 30, 30d supply, fill #1
  Filled 2024-02-09: qty 30, 30d supply, fill #2
  Filled 2024-03-09: qty 30, 30d supply, fill #3
  Filled 2024-04-06: qty 30, 30d supply, fill #4
  Filled 2024-05-09: qty 30, 30d supply, fill #5

## 2023-11-25 ENCOUNTER — Other Ambulatory Visit: Payer: Self-pay

## 2023-11-25 MED ORDER — HYDROCHLOROTHIAZIDE 25 MG PO TABS
25.0000 mg | ORAL_TABLET | Freq: Every day | ORAL | 0 refills | Status: DC
  Filled 2023-11-25 – 2023-12-27 (×2): qty 90, 90d supply, fill #0

## 2023-11-29 ENCOUNTER — Other Ambulatory Visit: Payer: Self-pay

## 2023-12-13 ENCOUNTER — Other Ambulatory Visit: Payer: Self-pay

## 2023-12-27 ENCOUNTER — Other Ambulatory Visit: Payer: Self-pay

## 2023-12-28 ENCOUNTER — Other Ambulatory Visit: Payer: Self-pay

## 2023-12-28 MED ORDER — NORGESTIMATE-ETH ESTRADIOL 0.18/0.215/0.25 MG-25 MCG PO TABS
1.0000 | ORAL_TABLET | Freq: Every day | ORAL | 11 refills | Status: DC
  Filled 2023-12-28: qty 28, 28d supply, fill #0
  Filled 2024-02-09: qty 28, 28d supply, fill #1
  Filled 2024-03-09: qty 28, 28d supply, fill #2
  Filled 2024-04-06: qty 28, 28d supply, fill #3
  Filled 2024-05-03: qty 28, 28d supply, fill #4
  Filled 2024-06-01: qty 28, 28d supply, fill #5

## 2024-01-17 ENCOUNTER — Other Ambulatory Visit: Payer: Self-pay

## 2024-01-17 MED ORDER — ZEPBOUND 12.5 MG/0.5ML ~~LOC~~ SOAJ
12.5000 mg | SUBCUTANEOUS | 5 refills | Status: DC
  Filled 2024-01-17: qty 2, 28d supply, fill #0
  Filled 2024-02-09: qty 2, 28d supply, fill #1
  Filled 2024-03-09 – 2024-03-13 (×4): qty 2, 28d supply, fill #2
  Filled 2024-04-06: qty 2, 28d supply, fill #3
  Filled 2024-05-09: qty 2, 28d supply, fill #4
  Filled 2024-06-06: qty 2, 28d supply, fill #5

## 2024-01-21 ENCOUNTER — Other Ambulatory Visit: Payer: Self-pay

## 2024-01-21 MED ORDER — DICLOFENAC SODIUM 75 MG PO TBEC
75.0000 mg | DELAYED_RELEASE_TABLET | Freq: Two times a day (BID) | ORAL | 0 refills | Status: DC
  Filled 2024-01-21: qty 28, 14d supply, fill #0

## 2024-01-28 ENCOUNTER — Other Ambulatory Visit: Payer: Self-pay

## 2024-01-30 ENCOUNTER — Other Ambulatory Visit: Payer: Self-pay

## 2024-01-31 ENCOUNTER — Other Ambulatory Visit: Payer: Self-pay

## 2024-01-31 MED ORDER — CYCLOBENZAPRINE HCL 10 MG PO TABS
10.0000 mg | ORAL_TABLET | Freq: Two times a day (BID) | ORAL | 0 refills | Status: AC | PRN
  Filled 2024-01-31: qty 45, 23d supply, fill #0

## 2024-02-09 ENCOUNTER — Other Ambulatory Visit: Payer: Self-pay

## 2024-02-28 ENCOUNTER — Other Ambulatory Visit: Payer: Self-pay

## 2024-02-28 MED ORDER — DICLOFENAC SODIUM 75 MG PO TBEC
75.0000 mg | DELAYED_RELEASE_TABLET | Freq: Two times a day (BID) | ORAL | 0 refills | Status: DC
  Filled 2024-02-28: qty 28, 14d supply, fill #0

## 2024-03-01 ENCOUNTER — Other Ambulatory Visit: Payer: Self-pay

## 2024-03-01 MED ORDER — METHYLPREDNISOLONE 4 MG PO TBPK
ORAL_TABLET | ORAL | 0 refills | Status: AC
  Filled 2024-03-01: qty 21, 6d supply, fill #0

## 2024-03-09 ENCOUNTER — Other Ambulatory Visit: Payer: Self-pay

## 2024-03-10 ENCOUNTER — Other Ambulatory Visit: Payer: Self-pay

## 2024-03-13 ENCOUNTER — Other Ambulatory Visit: Payer: Self-pay

## 2024-03-17 ENCOUNTER — Other Ambulatory Visit: Payer: Self-pay

## 2024-03-29 ENCOUNTER — Other Ambulatory Visit: Payer: Self-pay

## 2024-03-29 ENCOUNTER — Encounter: Payer: Self-pay | Admitting: Dermatology

## 2024-03-29 DIAGNOSIS — N201 Calculus of ureter: Secondary | ICD-10-CM

## 2024-03-29 DIAGNOSIS — N200 Calculus of kidney: Secondary | ICD-10-CM

## 2024-03-29 DIAGNOSIS — L409 Psoriasis, unspecified: Secondary | ICD-10-CM

## 2024-03-29 MED ORDER — MOMETASONE FUROATE 0.1 % EX CREA
TOPICAL_CREAM | CUTANEOUS | 0 refills | Status: DC
  Filled 2024-03-29: qty 45, 30d supply, fill #0

## 2024-03-29 NOTE — Addendum Note (Signed)
 Addended byBETHA CORIE PLATER on: 03/29/2024 03:57 PM   Modules accepted: Orders

## 2024-03-29 NOTE — Addendum Note (Signed)
 Addended by: TANDA SETTER A on: 03/29/2024 12:46 PM   Modules accepted: Orders

## 2024-04-06 ENCOUNTER — Other Ambulatory Visit: Payer: Self-pay

## 2024-04-06 MED ORDER — HYDROCHLOROTHIAZIDE 25 MG PO TABS
25.0000 mg | ORAL_TABLET | Freq: Every day | ORAL | 0 refills | Status: DC
  Filled 2024-04-06: qty 90, 90d supply, fill #0

## 2024-04-07 ENCOUNTER — Other Ambulatory Visit: Payer: Self-pay

## 2024-05-03 ENCOUNTER — Other Ambulatory Visit: Payer: Self-pay

## 2024-05-04 ENCOUNTER — Other Ambulatory Visit: Payer: Self-pay

## 2024-05-04 MED ORDER — METHYLPREDNISOLONE 4 MG PO TBPK
ORAL_TABLET | ORAL | 0 refills | Status: DC
  Filled 2024-05-04: qty 21, 6d supply, fill #0

## 2024-05-05 ENCOUNTER — Other Ambulatory Visit: Payer: Self-pay

## 2024-05-05 MED ORDER — BUPROPION HCL ER (XL) 300 MG PO TB24
300.0000 mg | ORAL_TABLET | Freq: Every day | ORAL | 1 refills | Status: DC
  Filled 2024-05-05: qty 90, 90d supply, fill #0

## 2024-05-09 ENCOUNTER — Other Ambulatory Visit: Payer: Self-pay

## 2024-05-18 ENCOUNTER — Other Ambulatory Visit: Payer: Self-pay

## 2024-05-18 MED ORDER — DICLOFENAC SODIUM 75 MG PO TBEC
75.0000 mg | DELAYED_RELEASE_TABLET | Freq: Two times a day (BID) | ORAL | 0 refills | Status: AC
  Filled 2024-05-18: qty 60, 30d supply, fill #0

## 2024-05-18 MED ORDER — CYCLOBENZAPRINE HCL 10 MG PO TABS
10.0000 mg | ORAL_TABLET | Freq: Two times a day (BID) | ORAL | 0 refills | Status: DC
  Filled 2024-05-18: qty 30, 15d supply, fill #0

## 2024-05-31 ENCOUNTER — Other Ambulatory Visit: Payer: Self-pay | Admitting: Family Medicine

## 2024-05-31 DIAGNOSIS — Z1231 Encounter for screening mammogram for malignant neoplasm of breast: Secondary | ICD-10-CM

## 2024-06-01 ENCOUNTER — Other Ambulatory Visit: Payer: Self-pay

## 2024-06-06 ENCOUNTER — Other Ambulatory Visit: Payer: Self-pay

## 2024-06-22 ENCOUNTER — Other Ambulatory Visit: Payer: Self-pay

## 2024-06-22 MED ORDER — POTASSIUM CHLORIDE ER 10 MEQ PO TBCR
10.0000 meq | EXTENDED_RELEASE_TABLET | Freq: Every day | ORAL | 1 refills | Status: DC
  Filled 2024-06-22: qty 90, 90d supply, fill #0

## 2024-06-22 MED ORDER — NORGESTIM-ETH ESTRAD TRIPHASIC 0.18/0.215/0.25 MG-25 MCG PO TABS
1.0000 | ORAL_TABLET | Freq: Every day | ORAL | 3 refills | Status: DC
  Filled 2024-06-22: qty 84, 84d supply, fill #0

## 2024-06-22 MED ORDER — BUPROPION HCL ER (XL) 300 MG PO TB24
300.0000 mg | ORAL_TABLET | Freq: Every day | ORAL | 1 refills | Status: AC
  Filled 2024-06-22: qty 90, 90d supply, fill #0

## 2024-06-22 MED ORDER — ONDANSETRON 4 MG PO TBDP
4.0000 mg | ORAL_TABLET | Freq: Three times a day (TID) | ORAL | 0 refills | Status: DC
  Filled 2024-06-22: qty 20, 7d supply, fill #0

## 2024-06-22 MED ORDER — HYDROCHLOROTHIAZIDE 25 MG PO TABS
25.0000 mg | ORAL_TABLET | Freq: Every day | ORAL | 1 refills | Status: AC
  Filled 2024-06-22: qty 90, 90d supply, fill #0

## 2024-06-22 MED ORDER — NEOMYCIN-POLYMYXIN-HC 3.5-10000-1 OP SUSP
1.0000 [drp] | Freq: Two times a day (BID) | OPHTHALMIC | 0 refills | Status: AC
  Filled 2024-06-22: qty 7.5, 10d supply, fill #0

## 2024-06-22 MED ORDER — LEVOTHYROXINE SODIUM 50 MCG PO TABS
50.0000 ug | ORAL_TABLET | Freq: Every day | ORAL | 1 refills | Status: DC
  Filled 2024-06-22: qty 90, 90d supply, fill #0

## 2024-06-24 ENCOUNTER — Other Ambulatory Visit: Payer: Self-pay

## 2024-06-25 ENCOUNTER — Other Ambulatory Visit: Payer: Self-pay

## 2024-06-26 ENCOUNTER — Other Ambulatory Visit (HOSPITAL_COMMUNITY): Payer: Self-pay

## 2024-06-26 ENCOUNTER — Other Ambulatory Visit: Payer: Self-pay

## 2024-06-26 MED ORDER — ALPRAZOLAM 0.5 MG PO TABS
0.5000 mg | ORAL_TABLET | Freq: Every evening | ORAL | 1 refills | Status: DC | PRN
  Filled 2024-06-26 (×2): qty 90, 90d supply, fill #0

## 2024-06-27 ENCOUNTER — Other Ambulatory Visit: Payer: Self-pay

## 2024-06-28 ENCOUNTER — Other Ambulatory Visit: Payer: Self-pay

## 2024-06-28 MED ORDER — ZEPBOUND 12.5 MG/0.5ML ~~LOC~~ SOAJ
12.5000 mg | SUBCUTANEOUS | 5 refills | Status: AC
  Filled 2024-06-28: qty 2, 28d supply, fill #0

## 2024-06-29 ENCOUNTER — Other Ambulatory Visit: Payer: Self-pay

## 2024-06-29 MED ORDER — SULFASALAZINE 500 MG PO TABS
500.0000 mg | ORAL_TABLET | Freq: Two times a day (BID) | ORAL | 1 refills | Status: DC
  Filled 2024-06-29: qty 180, 90d supply, fill #0

## 2024-06-30 ENCOUNTER — Ambulatory Visit: Payer: Self-pay | Admitting: Urology

## 2024-06-30 ENCOUNTER — Other Ambulatory Visit: Payer: Self-pay

## 2024-06-30 ENCOUNTER — Ambulatory Visit: Admitting: Physician Assistant

## 2024-07-04 ENCOUNTER — Ambulatory Visit
Admission: RE | Admit: 2024-07-04 | Discharge: 2024-07-04 | Disposition: A | Discharge status: Home / Self Care | Source: Ambulatory Visit | Attending: Family Medicine | Admitting: Family Medicine

## 2024-07-04 DIAGNOSIS — Z1231 Encounter for screening mammogram for malignant neoplasm of breast: Secondary | ICD-10-CM | POA: Insufficient documentation

## 2024-07-25 ENCOUNTER — Ambulatory Visit: Admitting: Dermatology

## 2024-07-25 ENCOUNTER — Other Ambulatory Visit: Payer: Self-pay

## 2024-07-25 DIAGNOSIS — D225 Melanocytic nevi of trunk: Secondary | ICD-10-CM | POA: Diagnosis not present

## 2024-07-25 DIAGNOSIS — L409 Psoriasis, unspecified: Secondary | ICD-10-CM | POA: Diagnosis not present

## 2024-07-25 DIAGNOSIS — L821 Other seborrheic keratosis: Secondary | ICD-10-CM | POA: Diagnosis not present

## 2024-07-25 DIAGNOSIS — L719 Rosacea, unspecified: Secondary | ICD-10-CM | POA: Diagnosis not present

## 2024-07-25 DIAGNOSIS — D489 Neoplasm of uncertain behavior, unspecified: Secondary | ICD-10-CM

## 2024-07-25 DIAGNOSIS — L304 Erythema intertrigo: Secondary | ICD-10-CM | POA: Diagnosis not present

## 2024-07-25 DIAGNOSIS — L578 Other skin changes due to chronic exposure to nonionizing radiation: Secondary | ICD-10-CM | POA: Diagnosis not present

## 2024-07-25 DIAGNOSIS — L858 Other specified epidermal thickening: Secondary | ICD-10-CM | POA: Diagnosis not present

## 2024-07-25 DIAGNOSIS — Z1283 Encounter for screening for malignant neoplasm of skin: Secondary | ICD-10-CM | POA: Diagnosis not present

## 2024-07-25 DIAGNOSIS — L814 Other melanin hyperpigmentation: Secondary | ICD-10-CM | POA: Diagnosis not present

## 2024-07-25 DIAGNOSIS — W908XXA Exposure to other nonionizing radiation, initial encounter: Secondary | ICD-10-CM | POA: Diagnosis not present

## 2024-07-25 DIAGNOSIS — L720 Epidermal cyst: Secondary | ICD-10-CM | POA: Diagnosis not present

## 2024-07-25 MED ORDER — MOMETASONE FUROATE 0.1 % EX CREA
TOPICAL_CREAM | CUTANEOUS | 6 refills | Status: AC
  Filled 2024-07-25: qty 45, 45d supply, fill #0
  Filled 2024-07-26: qty 45, 30d supply, fill #0

## 2024-07-25 NOTE — Progress Notes (Unsigned)
 Follow-Up Visit   Subjective  Kelsey Randolph is a 48 y.o. female who presents for the following: Skin Cancer Screening and Full Body Skin Exam 6 month tbse  Hx of dysplastic nevi , hx of intertrigo, hx of rosacea, hx of psoriasis at elbows and hands has used mometasone  cream   The patient presents for Total-Body Skin Exam (TBSE) for skin cancer screening and mole check. The patient has spots, moles and lesions to be evaluated, some may be new or changing and the patient may have concern these could be cancer.  The following portions of the chart were reviewed this encounter and updated as appropriate: medications, allergies, medical history  Review of Systems:  No other skin or systemic complaints except as noted in HPI or Assessment and Plan.  Objective  Well appearing patient in no apparent distress; mood and affect are within normal limits.  A full examination was performed including scalp, head, eyes, ears, nose, lips, neck, chest, axillae, abdomen, back, buttocks, bilateral upper extremities, bilateral lower extremities, hands, feet, fingers, toes, fingernails, and toenails. All findings within normal limits unless otherwise noted below.   Relevant physical exam findings are noted in the Assessment and Plan.  Multiple Benign nevi at right buttock and right lower back   Multiple Benign nevi at right buttock and right lower back    Right Buttock 0.3 cm dark macule    Assessment & Plan   HISTORY OF DYSPLASTIC NEVUS 07/26/2023 right mid back paraspinal below braline  - moderate dysplastic   07/30/2022 Right mid buttocks, severe atypia excised 09/08/2022 07/30/2022 Right mid buttocks, severe atypia, excised 09/08/2022 07/27/2019 - right lateral bicep - severe atypia, close to margin 07/12/2017 - Left LQA periumbilical - moderate atypia  07/12/2017 Left upper back superior scapula - severe atypia excised 09/28/2017 07/16/2016 right superior medial buttocks just lateral to  superior crease No evidence of recurrence today Recommend regular full body skin exams Recommend daily broad spectrum sunscreen SPF 30+ to sun-exposed areas, reapply every 2 hours as needed.  Call if any new or changing lesions are noted between office visits   SKIN CANCER SCREENING PERFORMED TODAY.  ACTINIC DAMAGE - Chronic condition, secondary to cumulative UV/sun exposure - diffuse scaly erythematous macules with underlying dyspigmentation - Recommend daily broad spectrum sunscreen SPF 30+ to sun-exposed areas, reapply every 2 hours as needed.  - Staying in the shade or wearing long sleeves, sun glasses (UVA+UVB protection) and wide brim hats (4-inch brim around the entire circumference of the hat) are also recommended for sun protection.  - Call for new or changing lesions.  LENTIGINES, SEBORRHEIC KERATOSES, HEMANGIOMAS - Benign normal skin lesions - Benign-appearing - Call for any changes  MELANOCYTIC NEVI. - multiple benign nevi at right buttock and right lower back will continue to monitor See photos Benign-appearing.  Observation.  Call clinic for new or changing lesions.  Recommend daily use of broad spectrum spf 30+ sunscreen to sun-exposed areas.  - Tan-brown and/or pink-flesh-colored symmetric macules and papules - Benign appearing on exam today - Observation - Call clinic for new or changing moles - Recommend daily use of broad spectrum spf 30+ sunscreen to sun-exposed areas.   EPIDERMAL INCLUSION CYST Exam: Subcutaneous nodule at right axilla 0.8 cm  Benign-appearing. Exam most consistent with an epidermal inclusion cyst. Discussed that a cyst is a benign growth that can grow over time and sometimes get irritated or inflamed. Recommend observation if it is not bothersome. Discussed option of surgical excision to  remove it if it is growing, symptomatic, or other changes noted. Please call for new or changing lesions so they can be evaluated.  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 Genetic in nature. No cure. - Observe. - If desired, patient can use an emollient (moisturizer) containing ammonium lactate (AmLactin), urea or salicylic acid once a day to smooth the area  Recommend starting moisturizer with exfoliant (Urea, Salicylic acid, or Lactic acid) one to two times daily to help smooth rough and bumpy skin.  OTC options include Cetaphil Rough and Bumpy lotion (Urea), Eucerin Roughness Relief lotion or spot treatment cream (Urea), CeraVe SA lotion/cream for Rough and Bumpy skin (Sal Acid), Gold Bond Rough and Bumpy cream (Sal Acid), and AmLactin 12% lotion/cream (Lactic Acid).  If applying in morning, also apply sunscreen to sun-exposed areas, since these exfoliating moisturizers can increase sensitivity to sun.    ROSACEA Exam  A little pinkness at cheeks Chronic and persistent condition with duration or expected duration over one year. Condition is improving with treatment but not currently at goal. Rosacea is a chronic progressive skin condition usually affecting the face of adults, causing redness and/or acne bumps. It is treatable but not curable. It sometimes affects the eyes (ocular rosacea) as well. It may respond to topical and/or systemic medication and can flare with stress, sun exposure, alcohol, exercise, topical steroids (including hydrocortisone/cortisone 10) and some foods.  Daily application of broad spectrum spf 30+ sunscreen to face is recommended to reduce flares.   + grittiness of the eyes   Treatment Plan Deferred treatment    INTERTRIGO Exam: Erythematous macerated patches in body folds, groin, buttocks Chronic and persistent condition with duration or expected duration over one year. Condition is improving with treatment but not currently at goal. Intertrigo is a chronic recurrent rash that occurs in skin fold areas that may be associated with friction; heat; moisture; yeast; fungus; and bacteria.  It is  exacerbated by increased movement / activity; sweating; and higher atmospheric temperature.  Use of an absorbant powder such as Zeasorb AF powder or other OTC antifungal powder to the area daily can prevent rash recurrence. Other options to help keep the area dry include blow drying the area after bathing or using antiperspirant products such as Duradry sweat minimizing gel. Treatment Plan: Continue as needed flares skin Medicinals Iodoquinol 1%, Hydrocortisone 2.5%, Niacinamide 2% Cream twice a day to affected areas for up to two weeks.  Refills sent in   Psoriasis Elbows and hands With PsA - Chronic condition with duration or expected duration over one year. Currently well-controlled. Chronic non-curable, but treatable genetic/hereditary disease that may have other systemic features affecting other organ systems such as joints (Psoriatic Arthritis). It is associated with an increased risk of inflammatory bowel disease, heart disease, non-alcoholic fatty liver disease, and depression.   Continue Cosentyx  per her rheumatologist.  Continue as needed for flares Mometasone  cream at night up to 5 days per week as needed for psoriasis of hand.    Topical steroids (such as triamcinolone , fluocinolone, fluocinonide, mometasone , clobetasol, halobetasol, betamethasone , hydrocortisone) can cause thinning and lightening of the skin if they are used for too long in the same area. Your physician has selected the right strength medicine for your problem and area affected on the body. Please use your medication only as directed by your physician to prevent side effects.  NEOPLASM OF UNCERTAIN BEHAVIOR Right Buttock - Epidermal / dermal shaving  Lesion diameter (cm):  0.3 Informed consent:  discussed and consent obtained   Timeout: patient name, date of birth, surgical site, and procedure verified   Procedure prep:  Patient was prepped and draped in usual sterile fashion Prep type:  Isopropyl  alcohol Anesthesia: the lesion was anesthetized in a standard fashion   Anesthetic:  1% lidocaine  w/ epinephrine  1-100,000 buffered w/ 8.4% NaHCO3 Instrument used: flexible razor blade   Hemostasis achieved with: pressure, aluminum chloride and electrodesiccation   Outcome: patient tolerated procedure well   Post-procedure details: sterile dressing applied and wound care instructions given   Dressing type: bandage and petrolatum    Specimen 1 - Surgical pathology Differential Diagnosis: nevus r/o dysplasia   Check Margins: yes Nevus r/o dysplasia PSORIASIS   This Visit - mometasone  (ELOCON ) 0.1 % cream - Apply topically at bedtime up to 5 days per week as needed for psoriasis at hands ACTINIC SKIN DAMAGE   SKIN CANCER SCREENING   LENTIGO   MELANOCYTIC NEVUS, UNSPECIFIED LOCATION   HISTORY OF DYSPLASTIC NEVUS   CYST OF SKIN   ROSACEA   INTERTRIGO   COUNSELING AND COORDINATION OF CARE   MEDICATION MANAGEMENT   Return in about 6 months (around 01/23/2025) for TBSE.  IEleanor Blush, CMA, am acting as scribe for Alm Rhyme, MD.   Documentation: I have reviewed the above documentation for accuracy and completeness, and I agree with the above.  Alm Rhyme, MD

## 2024-07-25 NOTE — Patient Instructions (Signed)
 Biopsy Wound Care Instructions  Leave the original bandage on for 24 hours if possible.  If the bandage becomes soaked or soiled before that time, it is OK to remove it and examine the wound.  A small amount of post-operative bleeding is normal.  If excessive bleeding occurs, remove the bandage, place gauze over the site and apply continuous pressure (no peeking) over the area for 30 minutes. If this does not work, please call our clinic as soon as possible or page your doctor if it is after hours.   Once a day, cleanse the wound with soap and water. It is fine to shower. If a thick crust develops you may use a Q-tip dipped into dilute hydrogen peroxide (mix 1:1 with water) to dissolve it.  Hydrogen peroxide can slow the healing process, so use it only as needed.    After washing, apply petroleum jelly (Vaseline) or an antibiotic ointment if your doctor prescribed one for you, followed by a bandage.    For best healing, the wound should be covered with a layer of ointment at all times. If you are not able to keep the area covered with a bandage to hold the ointment in place, this may mean re-applying the ointment several times a day.  Continue this wound care until the wound has healed and is no longer open.   Itching and mild discomfort is normal during the healing process. However, if you develop pain or severe itching, please call our office.   If you have any discomfort, you can take Tylenol (acetaminophen) or ibuprofen as directed on the bottle. (Please do not take these if you have an allergy to them or cannot take them for another reason).  Some redness, tenderness and white or yellow material in the wound is normal healing.  If the area becomes very sore and red, or develops a thick yellow-green material (pus), it may be infected; please notify us .    If you have stitches, return to clinic as directed to have the stitches removed. You will continue wound care for 2-3 days after the stitches  are removed.   Wound healing continues for up to one year following surgery. It is not unusual to experience pain in the scar from time to time during the interval.  If the pain becomes severe or the scar thickens, you should notify the office.    A slight amount of redness in a scar is expected for the first six months.  After six months, the redness will fade and the scar will soften and fade.  The color difference becomes less noticeable with time.  If there are any problems, return for a post-op surgery check at your earliest convenience.  To improve the appearance of the scar, you can use silicone scar gel, cream, or sheets (such as Mederma or Serica) every night for up to one year. These are available over the counter (without a prescription).  Please call our office at [phone] for any questions or concerns.       Melanoma ABCDEs  Melanoma is the most dangerous type of skin cancer, and is the leading cause of death from skin disease.  You are more likely to develop melanoma if you: Have light-colored skin, light-colored eyes, or red or blond hair Spend a lot of time in the sun Tan regularly, either outdoors or in a tanning bed Have had blistering sunburns, especially during childhood Have a close family member who has had a melanoma Have atypical moles  or large birthmarks  Early detection of melanoma is key since treatment is typically straightforward and cure rates are extremely high if we catch it early.   The first sign of melanoma is often a change in a mole or a new dark spot.  The ABCDE system is a way of remembering the signs of melanoma.  A for asymmetry:  The two halves do not match. B for border:  The edges of the growth are irregular. C for color:  A mixture of colors are present instead of an even brown color. D for diameter:  Melanomas are usually (but not always) greater than 6mm - the size of a pencil eraser. E for evolution:  The spot keeps changing in  size, shape, and color.  Please check your skin once per month between visits. You can use a small mirror in front and a large mirror behind you to keep an eye on the back side or your body.   If you see any new or changing lesions before your next follow-up, please call to schedule a visit.  Please continue daily skin protection including broad spectrum sunscreen SPF 30+ to sun-exposed areas, reapplying every 2 hours as needed when you're outdoors.   Staying in the shade or wearing long sleeves, sun glasses (UVA+UVB protection) and wide brim hats (4-inch brim around the entire circumference of the hat) are also recommended for sun protection.     Due to recent changes in healthcare laws, you may see results of your pathology and/or laboratory studies on MyChart before the doctors have had a chance to review them. We understand that in some cases there may be results that are confusing or concerning to you. Please understand that not all results are received at the same time and often the doctors may need to interpret multiple results in order to provide you with the best plan of care or course of treatment. Therefore, we ask that you please give us  2 business days to thoroughly review all your results before contacting the office for clarification. Should we see a critical lab result, you will be contacted sooner.   If You Need Anything After Your Visit  If you have any questions or concerns for your doctor, please call our main line at [phone] and press option 4 to reach your doctor's medical assistant. If no one answers, please leave a voicemail as directed and we will return your call as soon as possible. Messages left after 4 pm will be answered the following business day.   You may also send us  a message via MyChart. We typically respond to MyChart messages within 1-2 business days.  For prescription refills, please ask your pharmacy to contact our office. Our fax number is  [phone].  If you have an urgent issue when the clinic is closed that cannot wait until the next business day, you can page your doctor at the number below.    Please note that while we do our best to be available for urgent issues outside of office hours, we are not available 24/7.   If you have an urgent issue and are unable to reach us , you may choose to seek medical care at your doctor's office, retail clinic, urgent care center, or emergency room.  If you have a medical emergency, please immediately call 911 or go to the emergency department.  Pager Numbers  - Dr. Hester: [phone]  - Dr. Jackquline: [phone]  - Dr. Claudene: [phone]   - Dr. Raymund:  [phone]  In the event of inclement weather, please call our main line at [phone] for an update on the status of any delays or closures.  Dermatology Medication Tips: Please keep the boxes that topical medications come in in order to help keep track of the instructions about where and how to use these. Pharmacies typically print the medication instructions only on the boxes and not directly on the medication tubes.   If your medication is too expensive, please contact our office at [phone] option 4 or send us  a message through MyChart.   We are unable to tell what your co-pay for medications will be in advance as this is different depending on your insurance coverage. However, we may be able to find a substitute medication at lower cost or fill out paperwork to get insurance to cover a needed medication.   If a prior authorization is required to get your medication covered by your insurance company, please allow us  1-2 business days to complete this process.  Drug prices often vary depending on where the prescription is filled and some pharmacies may offer cheaper prices.  The website www.goodrx.com contains coupons for medications through different pharmacies. The prices here do not account for what the cost  may be with help from insurance (it may be cheaper with your insurance), but the website can give you the price if you did not use any insurance.  - You can print the associated coupon and take it with your prescription to the pharmacy.  - You may also stop by our office during regular business hours and pick up a GoodRx coupon card.  - If you need your prescription sent electronically to a different pharmacy, notify our office through [facility] or by phone at [phone] option 4.     Si Usted Necesita Algo Despus de Su Visita  Tambin puede enviarnos un mensaje a travs de Clinical cytogeneticist. Por lo general respondemos a los mensajes de MyChart en el transcurso de 1 a 2 das hbiles.  Para renovar recetas, por favor pida a su farmacia que se ponga en contacto con nuestra oficina. Randi lakes de fax es [place] [phone].  Si tiene un asunto urgente cuando la clnica est cerrada y que no puede esperar hasta el siguiente da hbil, puede llamar/localizar a su doctor(a) al nmero que aparece a continuacin.   Por favor, tenga en cuenta que aunque hacemos todo lo posible para estar disponibles para asuntos urgentes fuera del horario de [place], no estamos disponibles las 24 horas del da, los 7 [address]  [address] de la [place].   Si tiene un problema urgente y no puede comunicarse con nosotros, puede optar por buscar atencin mdica  en el consultorio de su doctor(a), en una clnica privada, en un centro de atencin urgente o en una sala de emergencias.  Si tiene Engineer, drilling, por favor llame inmediatamente al 911 o vaya a la sala de emergencias.  Nmeros de bper  - Dr. Hester: [phone]  - Dra. Jackquline: [phone]  - Dr. Claudene: [phone]  - Dra. Kitts: [phone]  En caso de inclemencias del [place], por favor llame a nuestra lnea principal al [phone] para una actualizacin sobre el estado de cualquier retraso o cierre.  Consejos para la medicacin en dermatologa: Por  favor, guarde las cajas en las que vienen los medicamentos de uso tpico para ayudarle a seguir las instrucciones sobre dnde y cmo usarlos. Las farmacias generalmente imprimen las instrucciones del medicamento slo en las cajas y no directamente  en los tubos del medicamento.   Si su medicamento es muy caro, por favor, pngase en contacto con landry rieger llamando al [phone] y presione la opcin 4 o envenos un mensaje a travs de Clinical cytogeneticist.   No podemos decirle cul ser su copago por los medicamentos por adelantado ya que esto es diferente dependiendo de la cobertura de su seguro. Sin embargo, es posible que podamos encontrar un medicamento sustituto a Audiological scientist un formulario para que el seguro cubra el medicamento que se considera necesario.   Si se requiere una autorizacin previa para que su compaa de seguros malta su medicamento, por favor permtanos de 1 a 2 das hbiles para completar este proceso.  Los precios de los medicamentos varan con frecuencia dependiendo del Environmental consultant de dnde se surte la receta y alguna farmacias pueden ofrecer precios ms baratos.  El sitio web www.goodrx.com tiene cupones para medicamentos de Health and safety inspector. Los precios aqu no tienen en cuenta lo que podra costar con la ayuda del seguro (puede ser ms barato con su seguro), pero el sitio web puede darle el precio si no utiliz Tourist information centre manager.  - Puede imprimir el cupn correspondiente y llevarlo con su receta a la farmacia.  - Tambin puede pasar por nuestra oficina durante el horario de atencin regular y Education officer, museum una tarjeta de cupones de GoodRx.  - Si necesita que su receta se enve electrnicamente a una farmacia diferente, informe a nuestra oficina a travs de MyChart de [place] o por telfono llamando al [phone] y presione la opcin 4.

## 2024-07-26 ENCOUNTER — Other Ambulatory Visit: Payer: Self-pay

## 2024-07-26 ENCOUNTER — Encounter: Payer: Self-pay | Admitting: Dermatology

## 2024-07-26 ENCOUNTER — Encounter: Payer: Self-pay | Admitting: Pharmacist

## 2024-07-26 ENCOUNTER — Other Ambulatory Visit (HOSPITAL_COMMUNITY): Payer: Self-pay

## 2024-07-26 DIAGNOSIS — L719 Rosacea, unspecified: Secondary | ICD-10-CM

## 2024-07-26 MED ORDER — METRONIDAZOLE 0.75 % EX GEL
CUTANEOUS | 6 refills | Status: AC
  Filled 2024-07-26: qty 45, 30d supply, fill #0

## 2024-07-28 LAB — SURGICAL PATHOLOGY

## 2024-07-31 ENCOUNTER — Ambulatory Visit: Payer: Self-pay | Admitting: Dermatology

## 2024-07-31 NOTE — Telephone Encounter (Signed)
 Advised pt of bx result/sh ?

## 2024-07-31 NOTE — Telephone Encounter (Signed)
-----   Message from Alm Rhyme, MD sent at 07/31/2024  1:37 PM EST ----- FINAL DIAGNOSIS        1. Skin, right buttock :       MACULAR SEBORRHEIC KERATOSIS    Benign keratosis No further treatment needed

## 2024-08-14 ENCOUNTER — Other Ambulatory Visit: Payer: Self-pay

## 2024-08-14 MED ORDER — MECLIZINE HCL 12.5 MG PO TABS
12.5000 mg | ORAL_TABLET | Freq: Three times a day (TID) | ORAL | 1 refills | Status: AC | PRN
  Filled 2024-08-14: qty 45, 8d supply, fill #0

## 2024-08-16 ENCOUNTER — Other Ambulatory Visit: Payer: Self-pay

## 2024-08-18 ENCOUNTER — Other Ambulatory Visit: Payer: Self-pay

## 2024-08-18 ENCOUNTER — Other Ambulatory Visit (HOSPITAL_COMMUNITY): Payer: Self-pay

## 2024-08-18 MED ORDER — TIZANIDINE HCL 4 MG PO TABS
4.0000 mg | ORAL_TABLET | Freq: Three times a day (TID) | ORAL | 1 refills | Status: AC
  Filled 2024-08-18: qty 90, 30d supply, fill #0

## 2024-08-18 MED ORDER — PREDNISONE 10 MG PO TABS
ORAL_TABLET | ORAL | 0 refills | Status: AC
  Filled 2024-08-18: qty 20, 8d supply, fill #0

## 2024-08-18 NOTE — Progress Notes (Signed)
 History of Present Illness:   Kelsey Randolph is a 49 y.o. female here for   Verbally consented to the use of AI for note-taking.   Chief Complaint  Patient presents with   Dizziness    X 2 weeks Better when laying down  Back of neck is tight, slight HA    History of Present Illness Kelsey Randolph is a 49 year old female with psoriatic arthritis who presents with dizziness and neck tightness.  She experiences dizziness that is positional, occurring with head movements such as looking up and down. The dizziness is intermittent but exacerbated by activities like driving and screen use. Relief is found by lying down in a dark room. She uses meclizine , which partially alleviates symptoms. She also notes a sensation of fullness in her head and a slight headache at the back of her head. No congestion or blurry vision is present. She has a history of vertigo approximately fifteen years ago.  She reports significant neck tightness and frequently rubs her neck, resulting in a rash resembling a handprint. Her symptoms have prevented her from going to the gym for two weeks, impacting her usual routine as she identifies as a 'gym hoe'.  She has a history of psoriatic arthritis. Muscle relaxants like Flexeril  have been ineffective in the past. She notes episodes of elevated heart rate, particularly during dizziness.    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 Last Dose  ALPRAZolam  (XANAX ) 0.5 MG tablet Take 1 tablet (0.5 mg  total) by mouth at bedtime as needed for anxiety Yes Taking  buPROPion  (WELLBUTRIN  XL) 300 MG XL tablet Take 1 tablet (300 mg total) by mouth once daily Yes Taking  cetirizine (ZYRTEC) 10 MG tablet Take 10 mg by mouth once daily Yes Taking  COSENTYX  PEN, 2 PENS, 150 mg/mL PnIj INJECT 2 PENS SUBCUTANEOUSLY  EVERY 4 WEEKS Yes Taking  cyanocobalamin (VITAMIN B12) 1000 MCG tablet Take 1,000 mcg by mouth once daily Yes Taking  docusate (COLACE) 100 MG capsule Take 100 mg by mouth 2 (two) times daily Yes Taking  hydroCHLOROthiazide  (HYDRODIURIL ) 25 MG tablet Take 1 tablet (25 mg total) by mouth once daily Yes Taking  lactobacillus combination no.4 (PROBIOTIC) 3 billion cell Cap Take 1 capsule by mouth once daily Yes Taking  levothyroxine  (SYNTHROID ) 50 MCG tablet Take 1 tablet (50 mcg total) by mouth once daily Take on an empty stomach with a glass of water at least 30-60 minutes before breakfast. Yes Taking  meclizine  (ANTIVERT ) 12.5 mg tablet Take 1-2 tablets (12.5-25 mg total) by mouth 3 (three) times daily as needed (motion sickness) for up to 15 days Yes Taking  mometasone  (ELOCON ) 0.1 % cream Apply 0.1 % topically once daily Yes Taking  norgestimate -ethinyl estradiol  triphasic (TRI-VYLIBRA  LO) 0.18/0.215/0.25 mg-0.025 mg tablet Take 1 tablet by mouth once daily Yes Taking  ondansetron  (ZOFRAN -ODT) 4 MG disintegrating tablet Take 1 tablet (4 mg total) by mouth 3 (three)  times daily Yes Taking  potassium chloride  (KLOR-CON ) 10 MEQ ER tablet Take 1 tablet (10 mEq total) by mouth once daily Yes Taking  sulfaSALAzine  (AZULFIDINE ) 500 mg tablet Take 1 tablet (500 mg total) by mouth 2 (two) times daily Yes Taking  tirzepatide  (ZEPBOUND ) 12.5 mg/0.5 mL pen injector Inject 0.5 mLs (12.5 mg total) subcutaneously once a week Yes Taking  predniSONE  (DELTASONE ) 10 MG tablet 4,4,3,3,2,2,1,1    tiZANidine  (ZANAFLEX ) 4 MG tablet Take 1 tablet (4 mg total) by mouth 3 (three) times daily      Family History:    Family History  Problem Relation Name Age of Onset   Obesity Mother Consuelo Hummer    Stroke Father Garrel Hummer    Coronary Artery Disease (Blocked arteries around heart) Father Garrel Hummer     Social History:   Social History   Socioeconomic History   Marital status: Married  Tobacco Use   Smoking status: Former    Current packs/day: 0.00    Average packs/day: 0.5 packs/day for 15.0 years (7.5 ttl pk-yrs)    Types: Cigarettes    Start date: 08/09/1998    Quit date: 08/09/2013    Years since quitting: 11.0    Passive exposure: Past   Smokeless tobacco: Never  Vaping Use   Vaping status: Never Used  Substance and Sexual Activity   Alcohol use: Not Currently    Comment: Occasional   Drug use: No   Sexual activity: Not Currently    Partners: Male   Social Drivers of Health   Financial Resource Strain: Low Risk  (08/08/2024)   Overall Financial Resource Strain (CARDIA)    Difficulty of Paying Living Expenses: Not hard at all  Food Insecurity: No Food Insecurity (08/08/2024)   Hunger Vital Sign    Worried About Running Out of Food in the Last Year: Never true    Ran Out of Food in the Last Year: Never true  Transportation Needs: No Transportation Needs (08/08/2024)   PRAPARE - Administrator, Civil Service (Medical): No    Lack of Transportation (Non-Medical): No  Housing Stability: Low Risk  (08/18/2024)   Housing Stability Vital Sign    Unable to Pay for Housing in the Last Year: No    Number of Times Moved in the Last Year: 0    Homeless in the Last Year: No    Review of Systems:   A 10 point review of systems is negative, except for the pertinent positives and negatives detailed in the HPI.  Vitals:   Vitals:   08/18/24 1241 08/18/24 1243 08/18/24 1245  BP: (!) 142/82 132/80 132/80  Patient Position: Lying Sitting Standing  Pulse: 97 104   SpO2: 100% 99%   Weight: (!) 106.1 kg (234 lb)    Height: 160 cm (5' 3)        Body mass index is 41.45 kg/m.  Physical Exam:   Physical Exam Vitals and nursing note reviewed.  Constitutional:      General: She is not in acute distress.    Appearance: Normal appearance. She is not ill-appearing, toxic-appearing or diaphoretic.  HENT:     Head: Normocephalic and atraumatic.     Right Ear: External ear normal.     Left Ear: External ear normal.  Eyes:     General:        Right eye: No discharge.        Left eye: No discharge.     Conjunctiva/sclera: Conjunctivae normal.  Cardiovascular:     Rate and Rhythm: Normal rate and regular rhythm.     Pulses: Normal pulses.     Heart sounds: Normal heart sounds. No murmur heard.    No friction rub. No gallop.  Pulmonary:     Effort: Pulmonary effort is normal. No respiratory distress.     Breath sounds: Normal breath sounds. No stridor. No wheezing, rhonchi or rales.  Chest:     Chest wall: No tenderness.  Musculoskeletal:     Cervical back: Tenderness present. No swelling or edema. Pain with movement present.  Skin:    General: Skin is warm and dry.     Capillary Refill: Capillary refill takes less than 2 seconds.  Neurological:     Mental Status: She is alert.     Assessment and Plan:  No results found for this visit on 08/18/24.  Diagnoses and all orders for this visit:  Neck pain  Benign paroxysmal positional vertigo due to bilateral vestibular disorder  Psoriasis  Other orders -     predniSONE  (DELTASONE ) 10 MG tablet; 4,4,3,3,2,2,1,1 -     tiZANidine  (ZANAFLEX ) 4 MG tablet; Take 1 tablet (4 mg total) by mouth 3 (three) times daily    Assessment & Plan Cervicogenic dizziness Positional dizziness likely related to cervical spine issues, exacerbated by head movements and driving. Symptoms include neck tightness, headache, and a sensation of fullness in the head. No congestion or blurry vision reported. Differential includes vertigo, but symptoms suggest a positional component. Stress and  screen time may contribute to symptoms. - Prescribed prednisone  to reduce inflammation and alleviate symptoms. - Referred to vestibular rehabilitation for further management. - Recommended massage therapy to alleviate neck tightness. - Prescribed tizanidine  for muscle relaxation, up to three times a day as needed.  Psoriatic arthritis Prednisone  may provide additional relief for inflammation associated with psoriatic arthritis. - Continue current management for psoriatic arthritis. - Prescribed prednisone , which may also benefit psoriatic arthritis symptoms.  Hypertension Blood pressure is well-controlled at 132/80 mmHg. - Continue current antihypertensive regimen.   Follow up She will call the office with any new or worsening symptoms   This note has been created using automated tools and reviewed for accuracy by provider.  Patient received an After Visit Summary    Attestation Statement:   I personally performed the service, non-incident to. (WP)   GLENDA MACARIO HADDOCK, NP

## 2024-08-23 ENCOUNTER — Emergency Department (HOSPITAL_BASED_OUTPATIENT_CLINIC_OR_DEPARTMENT_OTHER)

## 2024-08-23 ENCOUNTER — Other Ambulatory Visit: Payer: Self-pay

## 2024-08-23 ENCOUNTER — Emergency Department (HOSPITAL_BASED_OUTPATIENT_CLINIC_OR_DEPARTMENT_OTHER)
Admission: EM | Admit: 2024-08-23 | Discharge: 2024-08-23 | Disposition: A | Discharge status: Home / Self Care | Attending: Emergency Medicine | Admitting: Emergency Medicine

## 2024-08-23 ENCOUNTER — Encounter (HOSPITAL_BASED_OUTPATIENT_CLINIC_OR_DEPARTMENT_OTHER): Payer: Self-pay

## 2024-08-23 ENCOUNTER — Emergency Department (HOSPITAL_BASED_OUTPATIENT_CLINIC_OR_DEPARTMENT_OTHER): Admitting: Radiology

## 2024-08-23 DIAGNOSIS — G935 Compression of brain: Secondary | ICD-10-CM | POA: Diagnosis not present

## 2024-08-23 DIAGNOSIS — E039 Hypothyroidism, unspecified: Secondary | ICD-10-CM | POA: Insufficient documentation

## 2024-08-23 DIAGNOSIS — R519 Headache, unspecified: Secondary | ICD-10-CM

## 2024-08-23 LAB — BASIC METABOLIC PANEL WITH GFR
Anion gap: 13 (ref 5–15)
BUN: 14 mg/dL (ref 6–20)
CO2: 25 mmol/L (ref 22–32)
Calcium: 9.3 mg/dL (ref 8.9–10.3)
Chloride: 98 mmol/L (ref 98–111)
Creatinine, Ser: 0.74 mg/dL (ref 0.44–1.00)
GFR, Estimated: >60 mL/min
Glucose, Bld: 99 mg/dL (ref 70–99)
Potassium: 3.3 mmol/L — ABNORMAL LOW (ref 3.5–5.1)
Sodium: 137 mmol/L (ref 135–145)

## 2024-08-23 LAB — CBC
HCT: 43.3 % (ref 36.0–46.0)
Hemoglobin: 15 g/dL (ref 12.0–15.0)
MCH: 29.8 pg (ref 26.0–34.0)
MCHC: 34.6 g/dL (ref 30.0–36.0)
MCV: 85.9 fL (ref 80.0–100.0)
Platelets: 409 K/uL — ABNORMAL HIGH (ref 150–400)
RBC: 5.04 MIL/uL (ref 3.87–5.11)
RDW: 13 % (ref 11.5–15.5)
WBC: 14.1 K/uL — ABNORMAL HIGH (ref 4.0–10.5)
nRBC: 0 % (ref 0.0–0.2)

## 2024-08-23 LAB — TROPONIN T, HIGH SENSITIVITY: Troponin T High Sensitivity: <15 ng/L (ref 0–19)

## 2024-08-23 MED ORDER — KETOROLAC TROMETHAMINE 30 MG/ML IJ SOLN
30.0000 mg | Freq: Once | INTRAMUSCULAR | Status: AC
  Administered 2024-08-23: 30 mg via INTRAVENOUS
  Filled 2024-08-23: qty 1

## 2024-08-23 MED ORDER — SODIUM CHLORIDE 0.9 % IV BOLUS
1000.0000 mL | Freq: Once | INTRAVENOUS | Status: AC
  Administered 2024-08-23: 1000 mL via INTRAVENOUS

## 2024-08-23 MED ORDER — METOCLOPRAMIDE HCL 5 MG/ML IJ SOLN
10.0000 mg | Freq: Once | INTRAMUSCULAR | Status: AC
  Administered 2024-08-23: 10 mg via INTRAVENOUS
  Filled 2024-08-23: qty 2

## 2024-08-23 NOTE — ED Provider Notes (Signed)
 " [place] EMERGENCY DEPARTMENT AT [facility] Provider Note   CSN: [id number] Arrival date & time: 08/23/24  1616     Patient presents with: Hypertension   Kelsey Randolph is a 49 y.o. female.  With a history of obesity hypothyroidism and anxiety who presents to the ED for hypertension.  Patient reports ongoing headaches and lightheadedness over the last week.  Has been measuring her blood pressures at home which have been uncharacteristically elevated.  Her PCP saw her for this reason and suspected these were due to cervicogenic dizziness given positional dizziness and headaches.  She had a massage 3 days ago and felt significantly better afterwards but symptoms returned the next day.  She is taking prednisone  with a plan to continue massage therapy, initiate vestibular rehab and use tizanidine  4 spasming.  She is concerned that her blood pressure is elevated today.  PCP has also ordered outpatient MRI.  No focal deficits trauma injury fevers chills changes in vision or recent illness    Hypertension       Prior to Admission medications  Medication Sig Start Date End Date Taking? Authorizing Provider  ALPRAZolam  (XANAX ) 0.5 MG tablet Take 1 tablet (0.5 mg total) by mouth at bedtime as needed for anxiety 06/23/23     ALPRAZolam  (XANAX ) 0.5 MG tablet Take 1 tablet (0.5 mg total) by mouth at bedtime as needed for anxiety 06/26/24     buPROPion  (WELLBUTRIN  XL) 300 MG 24 hr tablet Take 1 tablet (300 mg total) by mouth daily. 05/05/24     buPROPion  (WELLBUTRIN  XL) 300 MG 24 hr tablet Take 1 tablet (300 mg total) by mouth daily. 06/22/24     celecoxib  (CELEBREX ) 100 MG capsule Take 1 capsule (100 mg total) by mouth daily as needed for pain Patient taking differently: Take 100 mg by mouth daily as needed for moderate pain (pain score 4-6) or mild pain (pain score 1-3). 06/23/23     cetirizine (ZYRTEC) 10 MG tablet Take 10 mg by mouth every morning.    [provider]   COSENTYX  SENSOREADY, 300 MG, 150 MG/ML SOAJ Inject 300 mg into the skin every 28 (twenty-eight) days.    [provider]  Cyanocobalamin (VITAMIN B-12) 5000 MCG SUBL Place 1 tablet under the tongue daily.    [provider]  cyclobenzaprine  (FLEXERIL ) 10 MG tablet Take 1 tablet (10 mg total) by mouth 2 (two) times daily as needed for Muscle spasms 01/31/24     cyclobenzaprine  (FLEXERIL ) 10 MG tablet Take 1 tablet (10 mg total) by mouth 2 (two) times daily. 05/18/24     diclofenac  (VOLTAREN ) 75 MG EC tablet Take 1 tablet (75 mg total) by mouth 2 (two) times daily with a meal. 05/18/24     docusate sodium (COLACE) 100 MG capsule Take 100 mg by mouth 2 (two) times daily.    [provider]  hydrochlorothiazide  (HYDRODIURIL ) 25 MG tablet Take 1 tablet (25 mg total) by mouth once daily 06/22/24     levothyroxine  (SYNTHROID ) 50 MCG tablet Take 1 tablet (50 mcg total) by mouth daily. Take on an empty stomach with a glass of water at least 30-60 minutes before breakfast. 06/23/23     levothyroxine  (SYNTHROID ) 50 MCG tablet Take 1 tablet (50 mcg total) by mouth daily. Take on an empty stomach with a glass of water at least 30-60 minutes before breakfast. 06/22/24     meclizine  (ANTIVERT ) 12.5 MG tablet Take 1-2 tablets (12.5-25 mg total) by mouth 3 (  three) times daily as needed for motion sickness for up to 15 days 08/14/24     metroNIDAZOLE  (METROGEL ) 0.75 % gel Apply topically to nose and eyelids at bedtime for rosacea 07/26/24   Hester Alm BROCKS, MD  mometasone  (ELOCON ) 0.1 % cream Apply topically at bedtime up to 5 days per week as needed for psoriasis at hands 07/25/24   Hester Alm BROCKS, MD  neomycin -polymyxin-hydrocortisone (CORTISPORIN ) 3.5-10000-1 ophthalmic suspension Place 1 drop into both eyes 2 (two) times daily for 10 days 06/22/24     Norgestim-Eth Estrad Triphasic (NORGESTIMATE -ETHINYL ESTRADIOL  TRIPHASIC) 0.18/0.215/0.25 MG-25 MCG tab Take 1 tablet by mouth once daily  12/09/22     Norgestim-Eth Estrad Triphasic (NORGESTIMATE -ETHINYL ESTRADIOL  TRIPHASIC) 0.18/0.215/0.25 MG-25 MCG tab Take 1 tablet by mouth once daily 06/22/24     Norgestimate -Eth Estradiol  (TRI-VYLIBRA  LO) 0.18/0.215/0.25 MG-25 MCG TABS Take 1 tablet by mouth once daily 12/28/23     Omega-3 Fatty Acids (FISH OIL PO) Take 2,000 Units by mouth daily.    [provider]  ondansetron  (ZOFRAN -ODT) 4 MG disintegrating tablet Take 1 tablet (4 mg total) by mouth every 8 (eight) hours as needed. 10/31/23   Jarold Olam HERO, PA-C  ondansetron  (ZOFRAN -ODT) 4 MG disintegrating tablet Take 1 tablet (4 mg total) by mouth 3 (three) times daily. 06/22/24     oxyCODONE  (ROXICODONE ) 5 MG immediate release tablet Take 1 tablet (5 mg total) by mouth every 4 (four) hours as needed for severe pain (pain score 7-10). 10/31/23   Jarold Olam HERO, PA-C  potassium chloride  (KLOR-CON ) 10 MEQ tablet Take 1 tablet (10 mEq total) by mouth daily. 11/24/23     potassium chloride  (KLOR-CON ) 10 MEQ tablet Take 1 tablet (10 mEq total) by mouth daily. 06/22/24     predniSONE  (DELTASONE ) 10 MG tablet Take 4 tablets (40 mg total) by mouth daily for 2 days, THEN 3 tablets (30 mg total) daily for 2 days, THEN 2 tablets (20 mg total) daily for 2 days, THEN 1 tablet (10 mg total) daily for 2 days. 08/18/24 08/26/24    Probiotic Product (PROBIOTIC PO) Take 1 capsule by mouth daily.    [provider]  sulfaSALAzine  (AZULFIDINE ) 500 MG tablet Take 1 tablet (500 mg total) by mouth 2 (two) times daily. 11/24/23     sulfaSALAzine  (AZULFIDINE ) 500 MG tablet Take 1 tablet (500 mg total) by mouth 2 (two) times daily 06/29/24     tamsulosin  (FLOMAX ) 0.4 MG CAPS capsule Take 1 capsule (0.4 mg total) by mouth daily. 11/05/23   Vaillancourt, Samantha, PA-C  tirzepatide  (ZEPBOUND ) 12.5 MG/0.5ML Pen Inject 12.5 mg into the skin once a week. 06/28/24     tiZANidine  (ZANAFLEX ) 4 MG tablet Take 1 tablet (4 mg total) by mouth 3 (three) times daily.  08/18/24     VITAMIN D -VITAMIN K PO Take 1 tablet by mouth daily at 6 (six) AM.    [provider]    Allergies: Patient has no known allergies.    Review of Systems  Updated Vital Signs BP 138/76   Pulse 99   Temp 98.2 F (36.8 C) (Oral)   Resp (!) 21   Ht 5' 2 (1.575 m)   Wt 105.2 kg   LMP 07/23/2024   SpO2 98%   BMI 42.43 kg/m   Physical Exam Vitals and nursing note reviewed.  HENT:     Head: Normocephalic and atraumatic.  Eyes:     Extraocular Movements: Extraocular movements intact.     Pupils: Pupils are  equal, round, and reactive to light.  Neck:     Comments: Left paraspinal tenderness with hypertonicity noted Cardiovascular:     Rate and Rhythm: Normal rate and regular rhythm.  Pulmonary:     Effort: Pulmonary effort is normal.     Breath sounds: Normal breath sounds.  Abdominal:     Palpations: Abdomen is soft.     Tenderness: There is no abdominal tenderness.  Musculoskeletal:     Cervical back: Neck supple. Tenderness present.  Skin:    General: Skin is warm and dry.  Neurological:     General: No focal deficit present.     Mental Status: She is alert.     Sensory: No sensory deficit.     Motor: No weakness.     Coordination: Coordination normal.  Psychiatric:        Mood and Affect: Mood normal.     (all labs ordered are listed, but only abnormal results are displayed) Labs Reviewed  BASIC METABOLIC PANEL WITH GFR - Abnormal; Notable for the following components:      Result Value   Potassium 3.3 (*)    All other components within normal limits  CBC - Abnormal; Notable for the following components:   WBC 14.1 (*)    Platelets 409 (*)    All other components within normal limits  PREGNANCY, URINE  TROPONIN T, HIGH SENSITIVITY  TROPONIN T, HIGH SENSITIVITY    EKG: None  Radiology: CT Cervical Spine Wo Contrast Result Date: 08/23/2024 EXAM: CT CERVICAL SPINE WITHOUT CONTRAST 08/23/2024 06:18:04 PM TECHNIQUE: CT of the cervical  spine was performed without the administration of intravenous contrast. Multiplanar reformatted images are provided for review. Automated exposure control, iterative reconstruction, and/or weight based adjustment of the mA/kV was utilized to reduce the radiation dose to as low as reasonably achievable. COMPARISON: None available. CLINICAL HISTORY: Neck pain, acute, no red flags Neck pain, acute, no red flags FINDINGS: BONES AND ALIGNMENT: No acute fracture or traumatic malalignment. DEGENERATIVE CHANGES: No significant degenerative changes. SOFT TISSUES: No prevertebral soft tissue swelling. IMPRESSION: 1. No significant abnormality Electronically signed by: Dorethia Molt MD 08/23/2024 06:27 PM EST RP Workstation: [id number]   CT Head Wo Contrast Result Date: 08/23/2024 EXAM: CT HEAD WITHOUT CONTRAST 08/23/2024 06:18:04 PM TECHNIQUE: CT of the head was performed without the administration of intravenous contrast. Automated exposure control, iterative reconstruction, and/or weight based adjustment of the mA/kV was utilized to reduce the radiation dose to as low as reasonably achievable. COMPARISON: None available. CLINICAL HISTORY: Headache, increasing frequency or severity. 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 No acute hemorrhage. No evidence of acute infarct. No hydrocephalus. No extra-axial collection. No mass effect or midline shift. ORBITS: No acute abnormality. SINUSES: No acute abnormality. SOFT TISSUES AND SKULL: Mild left parietal scalp soft tissue swelling. No skull fracture. IMPRESSION: 1. No acute intracranial abnormality. 2. 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 Electronically signed by: Dorethia Molt MD 08/23/2024 06:25 PM EST RP Workstation: [id number]   DG Chest 2 View Result Date: 08/23/2024 CLINICAL DATA:  Chest pain elevated blood pressure EXAM: CHEST - 2 VIEW COMPARISON:  09/07/2013,  chest CT 11/10/2021 FINDINGS: The heart size and mediastinal contours are within normal limits. Both lungs are clear. The visualized skeletal structures are unremarkable. IMPRESSION: No active cardiopulmonary disease. Electronically Signed   By: Luke Bun M.D.   On: 08/23/2024 17:12     Procedures   Medications  Ordered in the ED  ketorolac  (TORADOL ) 30 MG/ML injection 30 mg (30 mg Intravenous Given 08/23/24 1837)  sodium chloride  0.9 % bolus 1,000 mL (1,000 mLs Intravenous New Bag/Given 08/23/24 1837)  metoCLOPramide  (REGLAN ) injection 10 mg (10 mg Intravenous Given 08/23/24 1838)    Clinical Course as of 08/23/24 2005  Wed Aug 23, 2024  1930 CT cervical spine unremarkable.  CT head does show Chiari I malformation which likely represents the explanation for her headaches neck pain and recent symptoms.  It is unclear why she would be having symptoms now but 1 thought would be recent 90 pound weight loss.  I discussed this with neurosurgery PA on-call Megan.  No need for emergent neurosurgical intervention but she will follow-up with Dr. Mavis in the office to discuss further management.  I updated the patient and her husband about these findings and gave them some education on Chiari I malformations.  Stable for discharge with neurosurgery follow-up.  She understands return precautions will be worrisome for severe headache and knows what to come back to the emergency department for [MP]    Clinical Course User Index [MP] Pamella Ozell LABOR, DO                                 Medical Decision Making 49 year old female with history as above presenting for 1 week of ongoing headaches.  Headaches are made worse with changes in position and were relieved after massage therapy.  Also reporting some left-sided neck pain and knots in her left neck.  Afebrile normotensive here.  Elevated blood pressures previously at home.  Patient is very anxious that she could have a brain tumor or another  intracranial abnormality.  No neurologic deficits on my exam.  Will obtain CT head and C-spine to evaluate for any intracranial lesion or osseous abnormality within the cervical spine that would explain her headaches and neck pain.  No need for MRI here tonight as there are no other strokelike symptoms.  We can try migraine cocktail to see if this relieves any of her symptoms while she is here.  Patient does also voice concern for MI and denies chest pain currently but did have some chest tightness earlier.  Will obtain EKG has sensitive troponin laboratory workup to evaluate for ACS dysrhythmia  Amount and/or Complexity of Data Reviewed Labs: ordered. Radiology: ordered.  Risk Prescription drug management.        Final diagnoses:  Generalized headaches  Chiari I malformation [facility])    ED Discharge Orders     None          Pamella Ozell LABOR, DO 08/23/24 2005  "

## 2024-08-23 NOTE — Discharge Instructions (Signed)
 You were seen in the emergency department for headaches There was no evidence of heart attack A CAT scan showed a Chiari I malformation which is likely the explanation for your headaches and neck pain As discussed you do not need emergent neurosurgery for this but is important that you follow-up with neurosurgery in the office Please call the office of Dr. Mavis and establish an appointment to be seen Have the MRI taken that was ordered by the primary doctor Continue using Tylenol  Motrin  or over-the-counter migraine medications for headaches Return to the emerged department for severe headaches or any other concerns

## 2024-08-23 NOTE — ED Triage Notes (Signed)
 States present with high BP 190/  Having headaches and dizziness over last week and seen by PCP but high BP today.  States having some chest discomfort tightness

## 2024-08-29 ENCOUNTER — Other Ambulatory Visit: Payer: Self-pay

## 2024-08-29 MED ORDER — METOPROLOL SUCCINATE ER 25 MG PO TB24
12.5000 mg | ORAL_TABLET | Freq: Every day | ORAL | 1 refills | Status: AC
  Filled 2024-08-29 – 2024-08-30 (×2): qty 15, 30d supply, fill #0

## 2024-08-29 MED ORDER — ALPRAZOLAM 0.5 MG PO TABS
0.5000 mg | ORAL_TABLET | Freq: Two times a day (BID) | ORAL | 0 refills | Status: AC | PRN
  Filled 2024-08-29: qty 60, 30d supply, fill #0

## 2024-08-29 MED ORDER — ESCITALOPRAM OXALATE 5 MG PO TABS
5.0000 mg | ORAL_TABLET | Freq: Every day | ORAL | 2 refills | Status: AC
  Filled 2024-08-29 – 2024-08-30 (×2): qty 30, 30d supply, fill #0

## 2024-08-29 NOTE — Progress Notes (Signed)
 History of Present Illness:   Kelsey Randolph is a 49 y.o. female here for   Verbally consented to the use of AI for note-taking.   Chief Complaint  Patient presents with   Hypertension    History of Present Illness Kelsey Randolph is a 49 year old female with hypertension and anxiety who presents with persistent dizziness and elevated blood pressure.  She has been experiencing persistent dizziness and elevated blood pressure since late December, with symptoms beginning the weekend after Christmas. She describes episodes of feeling flushed and lightheaded, with blood pressure readings reaching as high as 180/88 mmHg. Anxiety accompanies these symptoms, which she feels may be contributing to her condition.  She has a known Chiari malformation seen on recent CT scan while in Emergency Room and wonders if it could be related to her symptoms. She has experienced significant weight loss recently and questions if this might be affecting her condition. Her dizziness is particularly severe in the mornings.  Her current medication regimen includes Xanax  twice daily for anxiety, Wellbutrin , and Zepbound  12.5 mg, which she has been taking the current dose for six months.  She reports recent episodes of head pressure and neck pain, but states she has never had migraines before. Her headaches are constant, and light bothers her. No shortness of breath, but she frequently feels tired and lightheaded.  Her social history includes regular water intake and maintaining her electrolytes. She has not been to the gym since early December due to her symptoms. She reports normal menstrual cycles regulated by birth control, with no recent illnesses or infections.   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 Last Dose  buPROPion  (WELLBUTRIN  XL) 300 MG XL tablet Take 1 tablet (300 mg total) by mouth once daily Yes Taking  cetirizine (ZYRTEC) 10 MG tablet Take 10 mg by mouth once daily Yes Taking  COSENTYX  PEN, 2 PENS, 150 mg/mL PnIj INJECT 2 PENS SUBCUTANEOUSLY  EVERY 4 WEEKS Yes Taking  cyanocobalamin (VITAMIN B12) 1000 MCG tablet Take 1,000 mcg by mouth once daily Yes Taking  docusate (COLACE) 100 MG capsule Take 100 mg by mouth 2 (two) times daily Yes Taking  hydroCHLOROthiazide  (HYDRODIURIL ) 25 MG tablet Take 1 tablet (25 mg total) by mouth once daily Yes Taking  lactobacillus combination no.4 (PROBIOTIC) 3 billion cell Cap Take 1 capsule by mouth once daily Yes Taking  levothyroxine  (SYNTHROID ) 50 MCG tablet Take 1 tablet (50 mcg total) by mouth once daily Take on an empty stomach with a glass of water at least 30-60 minutes before breakfast. Yes Taking  mometasone  (ELOCON ) 0.1 % cream Apply 0.1 % topically once daily Yes Taking  norgestimate -ethinyl estradiol  triphasic (TRI-VYLIBRA  LO) 0.18/0.215/0.25 mg-0.025 mg tablet Take 1 tablet by mouth once daily Yes Taking  ondansetron  (ZOFRAN -ODT) 4 MG disintegrating tablet Take 1 tablet (4 mg total) by mouth 3 (three) times daily Yes Taking  potassium chloride  (KLOR-CON ) 10 MEQ ER tablet Take  1 tablet (10 mEq total) by mouth once daily Yes Taking  sulfaSALAzine  (AZULFIDINE ) 500 mg tablet Take 1 tablet (500 mg total) by mouth 2 (two) times daily Yes Taking  tirzepatide  (ZEPBOUND ) 12.5 mg/0.5 mL pen injector Inject 0.5 mLs (12.5 mg total) subcutaneously once a week Yes Taking  tiZANidine  (ZANAFLEX ) 4 MG tablet Take 1 tablet (4 mg total) by mouth 3 (three) times daily Yes Taking   ALPRAZolam  (XANAX ) 0.5 MG tablet Take 1 tablet (0.5 mg total) by mouth 2 (two) times daily as needed for Sleep for up to 30 days    escitalopram  oxalate (LEXAPRO ) 5 MG tablet Take 1 tablet (5 mg total) by mouth once daily for 90 days    metoprolol  SUCCinate (TOPROL -XL) 25 MG XL tablet Take 0.5 tablets (12.5 mg total) by mouth once daily    predniSONE  (DELTASONE ) 10 MG tablet 4,4,3,3,2,2,1,1 Patient not taking: Reported on 08/29/2024  Not Taking    Family History:   Family History  Problem Relation Name Age of Onset   Obesity Mother Consuelo Hummer    Stroke Father Garrel Hummer    Coronary Artery Disease (Blocked arteries around heart) Father Garrel Hummer     Social History:   Social History   Socioeconomic History   Marital status: Married  Tobacco Use   Smoking status: Former    Current packs/day: 0.00    Average packs/day: 0.5 packs/day for 15.0 years (7.5 ttl pk-yrs)    Types: Cigarettes    Start date: 08/09/1998    Quit date: 08/09/2013    Years since quitting: 11.0    Passive exposure: Past   Smokeless tobacco: Never  Vaping Use   Vaping status: Never Used  Substance and Sexual Activity   Alcohol use: Not Currently    Comment: Occasional   Drug use: No   Sexual activity: Not Currently    Partners: Male   Social Drivers of Health   Financial Resource Strain: Low Risk  (08/29/2024)   Overall Financial Resource Strain (CARDIA)    Difficulty of Paying Living Expenses: Not hard at all  Food Insecurity: No Food Insecurity (08/29/2024)   Hunger Vital Sign    Worried About Running Out of Food in the Last Year: Never true    Ran Out of Food in the Last Year: Never true  Transportation Needs: No Transportation Needs (08/29/2024)   PRAPARE - Administrator, Civil Service (Medical): No    Lack of Transportation (Non-Medical): No  Housing Stability: Low Risk  (08/29/2024)   Housing Stability Vital Sign    Unable to Pay for Housing in the Last  Year: No    Number of Times Moved in the Last Year: 0    Homeless in the Last Year: No    Review of Systems:   A 10 point review of systems is negative, except for the pertinent positives and negatives detailed in the HPI.  Vitals:   Vitals:   08/29/24 1349 08/29/24 1415  BP: (!) 142/100 (!) 142/88  Pulse: (!) 120   SpO2: 98%   Weight: (!) 106 kg (233 lb 9.6 oz)   Height: 160 cm (5' 3)      Body mass index is 41.38 kg/m.  Physical Exam:   Physical Exam Vitals and nursing note reviewed.  Constitutional:      General: She is not in acute distress.    Appearance: Normal appearance. She is not ill-appearing, toxic-appearing or diaphoretic.  HENT:     Head: Normocephalic  and atraumatic.     Right Ear: External ear normal.     Left Ear: External ear normal.  Eyes:     General:        Right eye: No discharge.        Left eye: No discharge.     Conjunctiva/sclera: Conjunctivae normal.  Cardiovascular:     Rate and Rhythm: Normal rate and regular rhythm.     Pulses: Normal pulses.     Heart sounds: Normal heart sounds. No murmur heard.    No friction rub. No gallop.  Pulmonary:     Effort: Pulmonary effort is normal. No respiratory distress.     Breath sounds: Normal breath sounds. No stridor. No wheezing, rhonchi or rales.  Chest:     Chest wall: No tenderness.  Skin:    General: Skin is warm and dry.     Capillary Refill: Capillary refill takes less than 2 seconds.  Neurological:     Mental Status: She is alert.     Assessment and Plan:  No results found for this visit on 08/29/24.  Diagnoses and all orders for this visit:  Dizziness -     US  carotid bilateral; Future -     CARD holter monitor up to 48 hours - scan and analysis; Future -     CARD holter monitor up to 48 hours - hookup; Future -     MRI cervical spine without contrast; Future  Acute nonintractable headache, unspecified headache type -     US  carotid bilateral; Future -     MRI cervical  spine without contrast; Future  Tachycardia -     CARD holter monitor up to 48 hours - scan and analysis; Future -     CARD holter monitor up to 48 hours - hookup; Future  Cervical radiculopathy -     MRI cervical spine without contrast; Future  Other orders -     escitalopram  oxalate (LEXAPRO ) 5 MG tablet; Take 1 tablet (5 mg total) by mouth once daily for 90 days -     metoprolol  SUCCinate (TOPROL -XL) 25 MG XL tablet; Take 0.5 tablets (12.5 mg total) by mouth once daily -     ALPRAZolam  (XANAX ) 0.5 MG tablet; Take 1 tablet (0.5 mg total) by mouth 2 (two) times daily as needed for Sleep for up to 30 days    Assessment & Plan Cervicogenic dizziness and headache Persistent dizziness and headache since December, possibly related to Chiari malformation. Symptoms include pressure in the head, neck pain, and lightheadedness. Differential includes migraine and cervicogenic headache. Recent chiropractic adjustment provided temporary relief. Awaiting MRI results for further evaluation. - Ordered cervical spine MRI - Referred to neurosurgery for Chiari malformation evaluation  Hypertension Blood pressure remains elevated with recent readings of 180/88 and 162/unknown. Symptoms include flushing and dizziness. Current medication includes hydrochlorothiazide . Consideration of adding losartan or lisinopril for better control. Metoprolol  considered for its dual benefit on blood pressure and heart rate. - Added metoprolol  12.5 mg daily to manage blood pressure and heart rate - Continue hydrochlorothiazide  - Ordered carotid ultrasound - Ordered Holter monitor to assess heart rate and rhythm  Anxiety disorder Increased anxiety symptoms with episodes of crying and dizziness. Current treatment includes Xanax  and Wellbutrin . Consideration of adding Lexapro  for additional anxiety management. Metoprolol  may also aid in anxiety control by reducing heart rate. - Added Lexapro  5 mg daily for anxiety  management - Increased Xanax  to twice daily as needed - Started metoprolol  12.5  mg daily to help with anxiety and heart rate   Follow up 2 weeks, sooner if needed   This note has been created using automated tools and reviewed for accuracy by provider.  Patient received an After Visit Summary    Attestation Statement:   I personally performed the service, non-incident to. (WP)   GLENDA MACARIO HADDOCK, NP

## 2024-08-30 ENCOUNTER — Other Ambulatory Visit: Payer: Self-pay

## 2024-08-31 ENCOUNTER — Other Ambulatory Visit (HOSPITAL_BASED_OUTPATIENT_CLINIC_OR_DEPARTMENT_OTHER): Payer: Self-pay

## 2024-09-01 ENCOUNTER — Other Ambulatory Visit: Payer: Self-pay | Admitting: Family Medicine

## 2024-09-01 ENCOUNTER — Ambulatory Visit: Admitting: Neurosurgery

## 2024-09-01 ENCOUNTER — Encounter: Payer: Self-pay | Admitting: Neurosurgery

## 2024-09-01 VITALS — BP 136/97 | HR 96 | Temp 98.3°F | Ht 62.0 in | Wt 231.0 lb

## 2024-09-01 DIAGNOSIS — R42 Dizziness and giddiness: Secondary | ICD-10-CM

## 2024-09-01 DIAGNOSIS — G935 Compression of brain: Secondary | ICD-10-CM

## 2024-09-01 DIAGNOSIS — R519 Headache, unspecified: Secondary | ICD-10-CM

## 2024-09-01 DIAGNOSIS — M5412 Radiculopathy, cervical region: Secondary | ICD-10-CM

## 2024-09-01 DIAGNOSIS — M542 Cervicalgia: Secondary | ICD-10-CM

## 2024-09-01 DIAGNOSIS — R000 Tachycardia, unspecified: Secondary | ICD-10-CM

## 2024-09-01 NOTE — Progress Notes (Signed)
 " Assessment : The patient is a 49 year old female with a history of obesity and a 100 pound weight loss over the past 2 to 3 years.  Her current symptoms began on 08/05/2024 when she awoke with lightheadedness that has persisted to the present.  She describes a static feeling behind her head which she also characterizes as a pressure-like headache.  The symptoms typically appear more severe in the morning while improving in the evening.  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  An MRI has been ordered by her primary care doctor but she is a currently awaiting insurance authorization.  Plan : I am really happy to hear that she has lost this amount of weight and is continuing to do so.  The events of 26 December with this increasing pain in the back of her neck and the finding of a Chiari definitely has resulted in a lot of anxiety and fear of death.  I think there are 2 components to her current condition: The psychological element was somatic.  I think psychologically I was able to calm her down to tell her that she is not dying and that we will be able to manage her headaches.  That made her feel much better.  With regards to the somatic component she indeed has a 7 mm Chiari and also has an empty sella indicative of prolonged elevated intracranial pressures which can often be seen in women who are overweight.  We went over the findings and I told her that I would like for her to get a cine flow MRI and I ordered a new one to be done because the previous one is not being approved.  Secondly, she has an eye doctor appointment coming up on 17 February and I have written on a piece of paper that were papilledema for her ophthalmologist/optometrist to read and she  plans to bring the report with her.  This would lend credence to whether she has elevated intracranial pressures and we can see whether medical management with Diamox or Topamax would be an option to see if her symptoms get better.  All of her symptoms are in the back of her head/neck with some radiation into the right arm but she does not have the typical tingling in both arms, clumsiness, swallowing difficulties.  I will forward to seeing her back after I examine the MRI.   Social History   Socioeconomic History   Marital status: Married    Spouse name: Darryle   Number of children: 0   Years of education: Not on file   Highest education level: Not on file  Occupational History    Employer: [employer]  Tobacco Use   Smoking status: Former    Current packs/day: 0.00    Average packs/day: 0.5 packs/day for 20.0 years (10.0 ttl pk-yrs)    Types: Cigarettes    Start date: 08/10/1995    Quit date: 08/10/2015    Years since quitting: 9.0   Smokeless tobacco: Never  Vaping Use   Vaping status: Never Used  Substance and Sexual Activity   Alcohol use: Not Currently    Comment: RARE   Drug use: No   Sexual activity: Not on file  Other Topics Concern   Not on file  Social History Narrative  Not on file   Social Drivers of Health   Tobacco Use: Medium Risk (09/01/2024)   Patient History    Smoking Tobacco Use: Former    Smokeless Tobacco Use: Never    Passive Exposure: Not on file  Financial Resource Strain: Low Risk  (08/29/2024)   Received from [facility]   Overall Financial Resource Strain (CARDIA)    Difficulty of Paying Living Expenses: Not hard at all  Food Insecurity: No Food Insecurity (08/29/2024)   Received from [facility]   Epic    Within the past 12 months, you worried that your food would run out before you got the money to buy more.: Never true    Within the past 12 months, the food you bought just didn't last and you didn't have  money to get more.: Never true  Transportation Needs: No Transportation Needs (08/29/2024)   Received from [facility] - [facility]    In the past 12 months, has lack of transportation kept you from medical appointments or from getting medications?: No    Lack of Transportation (Non-Medical): No  Physical Activity: Not on file  Stress: Not on file  Social Connections: Not on file  Intimate Partner Violence: Not on file  Depression (PHQ2-9): Low Risk (04/01/2023)   Depression (PHQ2-9)    PHQ-2 Score: 0  Alcohol Screen: Not on file  Housing: Low Risk  (08/29/2024)   [facility]   Epic    In the last 12 months, was there a time when you were not able to pay the mortgage or rent on time?: No    In the past 12 months, how many times have you moved where you were living?: 0    At any time in the past 12 months, were you homeless or living in a shelter (including now)?: No  Utilities: Not At Risk (08/29/2024)   Received from [facility]   Epic    In the past 12 months has the electric, gas, oil, or water company threatened to shut off services in your home?: No  Health Literacy: Not on file    Family History  Problem Relation Age of Onset   Depression Mother    Hyperlipidemia Mother    Hypertension Mother    Healthy Brother    Heart disease Paternal Grandfather        MI; CAD   Heart disease Father    Dementia Maternal Grandmother    Diabetes Maternal Grandmother    Breast cancer Neg Hx     Allergies[1]  Past Medical History:  Diagnosis Date   Allergy    Anxiety    Arthritis    PSORIATIC   Borderline diabetes    Complication of anesthesia    Depression    Dysplastic nevus 07/27/2019   Right lateral bicep. Severe atypia, close to margin. Excised 11/14/2019, margins free.   Dysplastic nevus 07/30/2021   right mid back paraspinal, mod atypia   Dysplastic nevus 07/30/2022   Right Mid Buttocks,  Severe atypia, Excised 09/08/22   Dysplastic nevus 10/18/2023   right mid back paraspinal below braline moderate dysplastic will recheck at next follow up   GERD (gastroesophageal reflux disease)    RARE-NO MEDS   History of kidney stones 2004   History of tobacco use    Hx of dysplastic nevus 07/16/2016   Right superior medial buttocks just lateral to superior crease. Mild atypia  clost to deep margin   Hx of dysplastic nevus 07/12/2017   Left upper back sup. scapula. Severe atypia with focal fibrosis, close to lateral margin. Excised: 09/28/2017. Margins free   Hx of dysplastic nevus 07/12/2017   Left LQA periumbilical. Moderate atypia, irritated, close to lateral margin   Hx of dysplastic nevus 07/27/2019, exc 11/14/19   Right lateral bicep. Severe atypia, close to margin   Hypothyroidism    Morbidly obese (HCC)    PONV (postoperative nausea and vomiting)    Psoriasis    Cosentyx    Tachycardia     Past Surgical History:  Procedure Laterality Date   FOOT FRACTURE SURGERY Left 2012   LITHOTRIPSY  08/10/2002   SHOULDER ARTHROSCOPY W/ SUBACROMIAL DECOMPRESSION AND DISTAL CLAVICLE EXCISION Left 10/27/2022   TARSAL TUNNEL RELEASE Left 04/16/2017   Procedure: TARSAL TUNNEL RELEASE;  Surgeon: Ashley Soulier, DPM;  Location: [facility] ORS;  Service: Podiatry;  Laterality: Left;   TENDON REPAIR Left 04/16/2017   Procedure: FLEXOR TENDON REPAIR-SECONDARY ;  Surgeon: Ashley Soulier, DPM;  Location: [facility] OR[facility];  Service: Podiatry;  Laterality: Left;   TONSILLECTOMY AND ADENOIDECTOMY  08/11/1991     Physical Exam   Physical Exam HENT:     Head: Normocephalic.     Nose: Nose normal.  Eyes:     Pupils: Pupils are equal, round, and reactive to light.  Cardiovascular:     Rate and Rhythm: Normal rate.  Pulmonary:     Effort: Pulmonary effort is normal.  Abdominal:     General: Abdomen is flat.  Musculoskeletal:     Cervical back: Normal range of motion.  Neurological:     Mental Status:  Patient is alert.     Cranial Nerves: Cranial nerves 2-12 are intact.     Sensory: Sensation is intact.     Motor: Motor function is intact.     Coordination: Coordination is intact.     Results for orders placed or performed during the hospital encounter of 08/23/24  CT Head Wo Contrast   Narrative   EXAM: CT HEAD WITHOUT CONTRAST 08/23/2024 06:18:04 PM  TECHNIQUE: CT of the head was performed without the administration of intravenous contrast. Automated exposure control, iterative reconstruction, and/or weight based adjustment of the mA/kV was utilized to reduce the radiation dose to as low as reasonably achievable.  COMPARISON: None available.  CLINICAL HISTORY: Headache, increasing frequency or severity.  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 No acute hemorrhage. No evidence of acute infarct. No hydrocephalus. No extra-axial collection. No mass effect or midline shift.  ORBITS: No acute abnormality.  SINUSES: No acute abnormality.  SOFT TISSUES AND SKULL: Mild left parietal scalp soft tissue swelling. No skull fracture.  IMPRESSION: 1. No acute intracranial abnormality. 2. 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  Electronically signed by: Dorethia Molt MD 08/23/2024 06:25 PM EST RP Workstation: [id number]        [1] No Known Allergies  "

## 2024-09-01 NOTE — Progress Notes (Signed)
 Pt just started bp this week.

## 2024-09-05 ENCOUNTER — Ambulatory Visit: Admission: RE | Admit: 2024-09-05 | Source: Ambulatory Visit

## 2024-09-08 ENCOUNTER — Ambulatory Visit (HOSPITAL_COMMUNITY)
Admission: RE | Admit: 2024-09-08 | Discharge: 2024-09-08 | Disposition: A | Source: Ambulatory Visit | Attending: Neurosurgery | Admitting: Neurosurgery

## 2024-09-08 DIAGNOSIS — G935 Compression of brain: Secondary | ICD-10-CM | POA: Insufficient documentation

## 2024-09-19 ENCOUNTER — Ambulatory Visit: Admitting: Neurosurgery

## 2024-10-03 ENCOUNTER — Ambulatory Visit: Admitting: Neurosurgery

## 2024-10-27 ENCOUNTER — Ambulatory Visit: Admitting: Physician Assistant

## 2025-01-24 ENCOUNTER — Ambulatory Visit: Admitting: Dermatology
